# Patient Record
Sex: Female | Born: 1935
Health system: Southern US, Community
[De-identification: ages and names within clinical notes are randomized; demographics above are authoritative.]

## PROBLEM LIST (undated history)

## (undated) DIAGNOSIS — I10 Essential (primary) hypertension: Secondary | ICD-10-CM

## (undated) DIAGNOSIS — E119 Type 2 diabetes mellitus without complications: Secondary | ICD-10-CM

## (undated) DIAGNOSIS — E079 Disorder of thyroid, unspecified: Secondary | ICD-10-CM

## (undated) DIAGNOSIS — M51369 Other intervertebral disc degeneration, lumbar region without mention of lumbar back pain or lower extremity pain: Secondary | ICD-10-CM

## (undated) DIAGNOSIS — N289 Disorder of kidney and ureter, unspecified: Secondary | ICD-10-CM

## (undated) DIAGNOSIS — M5136 Other intervertebral disc degeneration, lumbar region: Secondary | ICD-10-CM

## (undated) HISTORY — PX: CHOLECYSTECTOMY: SHX55

## (undated) HISTORY — PX: BLADDER SURGERY: SHX569

---

## 2013-09-01 DIAGNOSIS — M5137 Other intervertebral disc degeneration, lumbosacral region: Secondary | ICD-10-CM | POA: Diagnosis present

## 2013-09-02 DIAGNOSIS — E1129 Type 2 diabetes mellitus with other diabetic kidney complication: Secondary | ICD-10-CM | POA: Diagnosis present

## 2013-09-02 DIAGNOSIS — E039 Hypothyroidism, unspecified: Secondary | ICD-10-CM | POA: Diagnosis present

## 2014-03-12 DIAGNOSIS — N183 Chronic kidney disease, stage 3 unspecified: Secondary | ICD-10-CM | POA: Diagnosis present

## 2016-03-07 DIAGNOSIS — M4316 Spondylolisthesis, lumbar region: Secondary | ICD-10-CM | POA: Diagnosis present

## 2016-03-07 DIAGNOSIS — M5441 Lumbago with sciatica, right side: Secondary | ICD-10-CM

## 2016-03-07 DIAGNOSIS — M5442 Lumbago with sciatica, left side: Secondary | ICD-10-CM

## 2016-03-07 DIAGNOSIS — G8929 Other chronic pain: Secondary | ICD-10-CM | POA: Diagnosis present

## 2017-06-12 ENCOUNTER — Inpatient Hospital Stay (HOSPITAL_COMMUNITY)
Admission: EM | Admit: 2017-06-12 | Discharge: 2017-06-21 | DRG: 552 | Disposition: A | Discharge status: Skilled Nursing Facility | Payer: Medicare Other | Attending: Oncology | Admitting: Oncology

## 2017-06-12 ENCOUNTER — Encounter (HOSPITAL_COMMUNITY): Payer: Self-pay

## 2017-06-12 ENCOUNTER — Other Ambulatory Visit: Payer: Self-pay

## 2017-06-12 ENCOUNTER — Emergency Department (HOSPITAL_COMMUNITY): Payer: Medicare Other

## 2017-06-12 DIAGNOSIS — M5442 Lumbago with sciatica, left side: Secondary | ICD-10-CM | POA: Diagnosis present

## 2017-06-12 DIAGNOSIS — M4316 Spondylolisthesis, lumbar region: Secondary | ICD-10-CM | POA: Diagnosis present

## 2017-06-12 DIAGNOSIS — G8929 Other chronic pain: Secondary | ICD-10-CM | POA: Diagnosis not present

## 2017-06-12 DIAGNOSIS — M6281 Muscle weakness (generalized): Secondary | ICD-10-CM | POA: Diagnosis not present

## 2017-06-12 DIAGNOSIS — M5137 Other intervertebral disc degeneration, lumbosacral region: Principal | ICD-10-CM | POA: Diagnosis present

## 2017-06-12 DIAGNOSIS — E1129 Type 2 diabetes mellitus with other diabetic kidney complication: Secondary | ICD-10-CM | POA: Diagnosis present

## 2017-06-12 DIAGNOSIS — M48061 Spinal stenosis, lumbar region without neurogenic claudication: Secondary | ICD-10-CM | POA: Diagnosis not present

## 2017-06-12 DIAGNOSIS — R5381 Other malaise: Secondary | ICD-10-CM | POA: Diagnosis not present

## 2017-06-12 DIAGNOSIS — M5136 Other intervertebral disc degeneration, lumbar region: Secondary | ICD-10-CM | POA: Diagnosis present

## 2017-06-12 DIAGNOSIS — G8911 Acute pain due to trauma: Secondary | ICD-10-CM | POA: Diagnosis not present

## 2017-06-12 DIAGNOSIS — M549 Dorsalgia, unspecified: Secondary | ICD-10-CM | POA: Diagnosis not present

## 2017-06-12 DIAGNOSIS — K5900 Constipation, unspecified: Secondary | ICD-10-CM | POA: Diagnosis present

## 2017-06-12 DIAGNOSIS — Z880 Allergy status to penicillin: Secondary | ICD-10-CM | POA: Diagnosis not present

## 2017-06-12 DIAGNOSIS — E876 Hypokalemia: Secondary | ICD-10-CM | POA: Diagnosis present

## 2017-06-12 DIAGNOSIS — R279 Unspecified lack of coordination: Secondary | ICD-10-CM | POA: Diagnosis not present

## 2017-06-12 DIAGNOSIS — S32000A Wedge compression fracture of unspecified lumbar vertebra, initial encounter for closed fracture: Secondary | ICD-10-CM

## 2017-06-12 DIAGNOSIS — M4807 Spinal stenosis, lumbosacral region: Secondary | ICD-10-CM | POA: Diagnosis not present

## 2017-06-12 DIAGNOSIS — R110 Nausea: Secondary | ICD-10-CM | POA: Diagnosis not present

## 2017-06-12 DIAGNOSIS — M5117 Intervertebral disc disorders with radiculopathy, lumbosacral region: Secondary | ICD-10-CM | POA: Diagnosis not present

## 2017-06-12 DIAGNOSIS — Z833 Family history of diabetes mellitus: Secondary | ICD-10-CM | POA: Diagnosis not present

## 2017-06-12 DIAGNOSIS — N183 Chronic kidney disease, stage 3 unspecified: Secondary | ICD-10-CM | POA: Diagnosis present

## 2017-06-12 DIAGNOSIS — Z9181 History of falling: Secondary | ICD-10-CM | POA: Diagnosis not present

## 2017-06-12 DIAGNOSIS — E039 Hypothyroidism, unspecified: Secondary | ICD-10-CM | POA: Diagnosis not present

## 2017-06-12 DIAGNOSIS — I129 Hypertensive chronic kidney disease with stage 1 through stage 4 chronic kidney disease, or unspecified chronic kidney disease: Secondary | ICD-10-CM | POA: Diagnosis not present

## 2017-06-12 DIAGNOSIS — M5116 Intervertebral disc disorders with radiculopathy, lumbar region: Secondary | ICD-10-CM | POA: Diagnosis not present

## 2017-06-12 DIAGNOSIS — Z111 Encounter for screening for respiratory tuberculosis: Secondary | ICD-10-CM | POA: Diagnosis not present

## 2017-06-12 DIAGNOSIS — E785 Hyperlipidemia, unspecified: Secondary | ICD-10-CM | POA: Diagnosis not present

## 2017-06-12 DIAGNOSIS — M5441 Lumbago with sciatica, right side: Secondary | ICD-10-CM

## 2017-06-12 DIAGNOSIS — Z66 Do not resuscitate: Secondary | ICD-10-CM | POA: Diagnosis present

## 2017-06-12 DIAGNOSIS — M48062 Spinal stenosis, lumbar region with neurogenic claudication: Secondary | ICD-10-CM

## 2017-06-12 DIAGNOSIS — Z8249 Family history of ischemic heart disease and other diseases of the circulatory system: Secondary | ICD-10-CM

## 2017-06-12 DIAGNOSIS — E1122 Type 2 diabetes mellitus with diabetic chronic kidney disease: Secondary | ICD-10-CM | POA: Diagnosis present

## 2017-06-12 DIAGNOSIS — I10 Essential (primary) hypertension: Secondary | ICD-10-CM | POA: Diagnosis not present

## 2017-06-12 DIAGNOSIS — R52 Pain, unspecified: Secondary | ICD-10-CM | POA: Diagnosis not present

## 2017-06-12 DIAGNOSIS — E119 Type 2 diabetes mellitus without complications: Secondary | ICD-10-CM

## 2017-06-12 DIAGNOSIS — S32000D Wedge compression fracture of unspecified lumbar vertebra, subsequent encounter for fracture with routine healing: Secondary | ICD-10-CM | POA: Diagnosis not present

## 2017-06-12 DIAGNOSIS — Z79899 Other long term (current) drug therapy: Secondary | ICD-10-CM

## 2017-06-12 DIAGNOSIS — R262 Difficulty in walking, not elsewhere classified: Secondary | ICD-10-CM | POA: Diagnosis not present

## 2017-06-12 DIAGNOSIS — M545 Low back pain: Secondary | ICD-10-CM

## 2017-06-12 HISTORY — DX: Other intervertebral disc degeneration, lumbar region without mention of lumbar back pain or lower extremity pain: M51.369

## 2017-06-12 HISTORY — DX: Type 2 diabetes mellitus without complications: E11.9

## 2017-06-12 HISTORY — DX: Other intervertebral disc degeneration, lumbar region: M51.36

## 2017-06-12 HISTORY — DX: Essential (primary) hypertension: I10

## 2017-06-12 LAB — BASIC METABOLIC PANEL
Anion gap: 10 (ref 5–15)
BUN: 15 mg/dL (ref 6–20)
CHLORIDE: 102 mmol/L (ref 101–111)
CO2: 27 mmol/L (ref 22–32)
CREATININE: 0.94 mg/dL (ref 0.44–1.00)
Calcium: 9.7 mg/dL (ref 8.9–10.3)
GFR, EST AFRICAN AMERICAN: 58 mL/min — AB (ref >60)
GFR, EST NON AFRICAN AMERICAN: 50 mL/min — AB (ref >60)
Glucose, Bld: 127 mg/dL — ABNORMAL HIGH (ref 65–99)
POTASSIUM: 3.4 mmol/L — AB (ref 3.5–5.1)
SODIUM: 139 mmol/L (ref 135–145)

## 2017-06-12 LAB — CBC WITH DIFFERENTIAL/PLATELET
BASOS PCT: 1 %
Basophils Absolute: 0 10*3/uL (ref 0.0–0.1)
EOS ABS: 0.2 10*3/uL (ref 0.0–0.7)
EOS PCT: 2 %
HCT: 39.2 % (ref 36.0–46.0)
HEMOGLOBIN: 12.5 g/dL (ref 12.0–15.0)
Lymphocytes Relative: 27 %
Lymphs Abs: 2.3 10*3/uL (ref 0.7–4.0)
MCH: 29.1 pg (ref 26.0–34.0)
MCHC: 31.9 g/dL (ref 30.0–36.0)
MCV: 91.2 fL (ref 78.0–100.0)
Monocytes Absolute: 0.9 10*3/uL (ref 0.1–1.0)
Monocytes Relative: 10 %
NEUTROS PCT: 60 %
Neutro Abs: 5.2 10*3/uL (ref 1.7–7.7)
PLATELETS: 355 10*3/uL (ref 150–400)
RBC: 4.3 MIL/uL (ref 3.87–5.11)
RDW: 13.4 % (ref 11.5–15.5)
WBC: 8.6 10*3/uL (ref 4.0–10.5)

## 2017-06-12 LAB — TSH: TSH: 3.254 u[IU]/mL (ref 0.350–4.500)

## 2017-06-12 MED ORDER — ATORVASTATIN CALCIUM 40 MG PO TABS
40.0000 mg | ORAL_TABLET | Freq: Every day | ORAL | Status: DC
  Administered 2017-06-12 – 2017-06-18 (×4): 40 mg via ORAL
  Filled 2017-06-12 (×5): qty 1

## 2017-06-12 MED ORDER — MORPHINE SULFATE (PF) 4 MG/ML IV SOLN
4.0000 mg | Freq: Once | INTRAVENOUS | Status: AC
  Administered 2017-06-12: 4 mg via INTRAVENOUS
  Filled 2017-06-12: qty 1

## 2017-06-12 MED ORDER — HYDROCODONE-ACETAMINOPHEN 5-325 MG PO TABS: 1.0000 | ORAL_TABLET | Freq: Four times a day (QID) | ORAL | Status: DC | PRN

## 2017-06-12 MED ORDER — ONDANSETRON HCL 4 MG/2ML IJ SOLN
4.0000 mg | Freq: Once | INTRAMUSCULAR | Status: AC
  Administered 2017-06-12: 4 mg via INTRAVENOUS
  Filled 2017-06-12: qty 2

## 2017-06-12 MED ORDER — HEPARIN SODIUM (PORCINE) 5000 UNIT/ML IJ SOLN
5000.0000 [IU] | Freq: Three times a day (TID) | INTRAMUSCULAR | Status: DC
  Administered 2017-06-12 – 2017-06-21 (×23): 5000 [IU] via SUBCUTANEOUS
  Filled 2017-06-12 (×24): qty 1

## 2017-06-12 MED ORDER — HYDROCODONE-ACETAMINOPHEN 5-325 MG PO TABS: 1.0000 | ORAL_TABLET | Freq: Four times a day (QID) | ORAL | 0 refills | Status: DC | PRN

## 2017-06-12 MED ORDER — FENTANYL CITRATE (PF) 100 MCG/2ML IJ SOLN
50.0000 ug | Freq: Once | INTRAMUSCULAR | Status: AC
  Administered 2017-06-12: 50 ug via INTRAVENOUS
  Filled 2017-06-12: qty 2

## 2017-06-12 MED ORDER — GABAPENTIN 300 MG PO CAPS
300.0000 mg | ORAL_CAPSULE | Freq: Every day | ORAL | Status: DC
  Administered 2017-06-12 – 2017-06-20 (×8): 300 mg via ORAL
  Filled 2017-06-12 (×9): qty 1

## 2017-06-12 MED ORDER — POTASSIUM CHLORIDE CRYS ER 20 MEQ PO TBCR
40.0000 meq | EXTENDED_RELEASE_TABLET | Freq: Once | ORAL | Status: AC
  Administered 2017-06-12: 40 meq via ORAL
  Filled 2017-06-12: qty 2

## 2017-06-12 MED ORDER — ONDANSETRON 4 MG PO TBDP: 4.0000 mg | ORAL_TABLET | Freq: Four times a day (QID) | ORAL | 0 refills | Status: DC | PRN

## 2017-06-12 MED ORDER — SODIUM CHLORIDE 0.9 % IV SOLN
INTRAVENOUS | Status: DC
  Administered 2017-06-12: 17:00:00 via INTRAVENOUS

## 2017-06-12 MED ORDER — GADOBENATE DIMEGLUMINE 529 MG/ML IV SOLN
10.0000 mL | Freq: Once | INTRAVENOUS | Status: AC | PRN
  Administered 2017-06-12: 10 mL via INTRAVENOUS

## 2017-06-12 MED ORDER — LORAZEPAM 2 MG/ML IJ SOLN
0.5000 mg | Freq: Once | INTRAMUSCULAR | Status: AC | PRN
  Administered 2017-06-12: 0.5 mg via INTRAVENOUS
  Filled 2017-06-12: qty 1

## 2017-06-12 MED ORDER — ACETAMINOPHEN 325 MG PO TABS
650.0000 mg | ORAL_TABLET | Freq: Four times a day (QID) | ORAL | Status: DC | PRN
  Administered 2017-06-12 – 2017-06-19 (×7): 650 mg via ORAL
  Filled 2017-06-12 (×7): qty 2

## 2017-06-12 MED ORDER — DOCUSATE SODIUM 100 MG PO CAPS: 100.0000 mg | ORAL_CAPSULE | Freq: Two times a day (BID) | ORAL | 0 refills | Status: DC

## 2017-06-12 MED ORDER — BOOST / RESOURCE BREEZE PO LIQD CUSTOM
1.0000 | Freq: Three times a day (TID) | ORAL | Status: DC
  Administered 2017-06-13 – 2017-06-20 (×19): 1 via ORAL

## 2017-06-12 MED ORDER — LEVOTHYROXINE SODIUM 100 MCG PO TABS
100.0000 ug | ORAL_TABLET | Freq: Every day | ORAL | Status: DC
  Administered 2017-06-13 – 2017-06-21 (×9): 100 ug via ORAL
  Filled 2017-06-12 (×10): qty 1

## 2017-06-12 NOTE — ED Notes (Signed)
Attempted Report x1.   

## 2017-06-12 NOTE — ED Notes (Signed)
Ortho made aware of the need for Quickdraw brace. MD Ward stated patient was allowed water and food along with ambulation after brace is placed.

## 2017-06-12 NOTE — Discharge Instructions (Signed)
Please follow-up closely with Dr. Catalina Antiguaamos  You did have slight progression of your spinal stenosis in your lumbar spine and 2 new appearing compression deformities.  We are putting you in a lumbar brace.  Please use your walker at all times.

## 2017-06-12 NOTE — ED Notes (Signed)
[employer] Employee at the bedside placing back brace.

## 2017-06-12 NOTE — ED Provider Notes (Signed)
Patient attempted to ambulate with walker but was unable to safely.  Discussed with teaching service.  Will see patient in the emergency department.  Family was updated.   Loren RacerYelverton, Clem Wisenbaker, MD 06/12/17 [phone]

## 2017-06-12 NOTE — ED Notes (Signed)
Ordered reg diet tray 

## 2017-06-12 NOTE — ED Notes (Signed)
ED Provider at bedside. 

## 2017-06-12 NOTE — ED Provider Notes (Addendum)
TIME SEEN: 1:49 AM  CHIEF COMPLAINT: Back pain  HPI: Patient is a 83 year old female with history of who presents to the emergency department with worsening back pain over the past several days and pain that shoots down both legs.  Tonight she states that her legs gave out from underneath her and she fell to the ground.  States that she slid down slowly and landed on her bottom.  Denies any new pain.  No head injury or neck pain.  Does have some tingling in both feet and ankles.  No bowel or bladder incontinence.  No urinary retention.  No fever.  She sees Dr. Dossie Der with pain management and has been receiving epidural injections.  Has had 3 total epidural injections with her last being December 5.  She is on gabapentin and has not had any relief her PCP is Dr. Tiana Loft.  Her family is concerned that she seems like her legs are more weak than normal but she states she thinks this may just be due to pain.  They report that she is now having to use a walker.  She was unable to stand at the house after going down to the ground.  ROS: See HPI Constitutional: no fever  Eyes: no drainage  ENT: no runny nose   Cardiovascular:  no chest pain  Resp: no SOB  GI: no vomiting GU: no dysuria Integumentary: no rash  Allergy: no hives  Musculoskeletal: no leg swelling  Neurological: no slurred speech ROS otherwise negative  PAST MEDICAL HISTORY/PAST SURGICAL HISTORY:  Past Medical History:  Diagnosis Date  . Degenerative disc disease, lumbar   . Diabetes mellitus without complication ([place])   . Hypertension     MEDICATIONS:  Prior to Admission medications   Not on File    ALLERGIES:  Allergies  Allergen Reactions  . Penicillins     SOCIAL HISTORY:  Social History   Tobacco Use  . Smoking status: Never Smoker  . Smokeless tobacco: Never Used  Substance Use Topics  . Alcohol use: No    Frequency: Never    FAMILY HISTORY: History reviewed. No pertinent family history.  EXAM: BP  (!) 178/67 (BP Location: Right Arm)   Pulse 61   Temp 97.6 F (36.4 C) (Oral)   Resp 17   Ht '5\' 4"'  (1.626 m)   Wt 59 kg (130 lb)   SpO2 100%   BMI 22.31 kg/m  CONSTITUTIONAL: Alert and oriented and responds appropriately to questions.  Elderly, grimacing in pain, afebrile HEAD: Normocephalic; atraumatic EYES: Conjunctivae clear, PERRL, EOMI ENT: normal nose; no rhinorrhea; moist mucous membranes; pharynx without lesions noted; no dental injury; no septal hematoma NECK: Supple, no meningismus, no LAD; no midline spinal tenderness, step-off or deformity; trachea midline CARD: RRR; S1 and S2 appreciated; no murmurs, no clicks, no rubs, no gallops RESP: Normal chest excursion without splinting or tachypnea; breath sounds clear and equal bilaterally; no wheezes, no rhonchi, no rales; no hypoxia or respiratory distress CHEST:  chest wall stable, no crepitus or ecchymosis or deformity, nontender to palpation; no flail chest ABD/GI: Normal bowel sounds; non-distended; soft, non-tender, no rebound, no guarding; no ecchymosis or other lesions noted PELVIS:  stable, nontender to palpation BACK:  The back appears normal but is tender to palpation over the lower lumbar spine without step-off or deformity.  There is no redness, warmth, ecchymosis or swelling noted. EXT: Normal ROM in all joints; non-tender to palpation; no edema; normal capillary refill; no cyanosis, no bony  tenderness or bony deformity of patient's extremities, no joint effusion, compartments are soft, extremities are warm and well-perfused, no ecchymosis SKIN: Normal color for age and race; warm, 2+ radial and DP pulses bilaterally NEURO: Moves all extremities equally but has difficulty with hip flexion bilaterally secondary to pain, normal dorsi and plantar flexion in both feet, normal strength in bilateral upper extremities, sensation to light touch intact except over her bilateral feet and ankles where she reports it is diminished, no  saddle anesthesia, normal reflexes in bilateral upper and lower extremities, no clonus, normal speech, cranial nerves II through XII intact PSYCH: The patient's mood and manner are appropriate. Grooming and personal hygiene are appropriate.  MEDICAL DECISION MAKING: Patient here with progressively worsening back pain that is now causing her to have her legs give out on her.  It is difficult to determine if this is from pain that radiates down both legs or weakness.  She has been receiving epidural injections and therefore would be at risk for infection.  Will obtain MRI of her lumbar spine with and without contrast to evaluate for epidural abscess, hematoma, discitis, cauda equina, spinal stenosis.  Will give pain medication and reassess.  ED PROGRESS: Patient's labs are unremarkable.  MRI shows critical spinal stenosis at L3-L4 and L4-L5 related to central disc extrusions with slight progression since 2017 when she had her last MRI.  There is also abnormal edema and enhancement of the inferior L4 and superior L5 endplates are likely due to acute versus subacute compression fractures.  Radiology reports cannot rule out osteomyelitis and discitis although I feel this is very unlikely.  No fever or leukocytosis.  Will discuss with neurosurgery for their recommendations.     7:50 AM  D/w Dr. Ronnald Ramp with neurosurgery. Appreciate his help. He has reviewed patient's imaging.  He does feel that she does have acute versus subacute endplate deformities.  He recommends a QuickDraw lumbar brace.  He states patient can follow-up with Dr. Nelva Bush as she would have access to a spinal surgeon with Dr. Tonita Cong or Dr. Rolena Infante.  He does not feel she needs to have surgery emergently.  He does recommend that if patient is unable to ambulate that she be admitted to the medicine service for physical therapy and pain control.  I discussed this with patient and HER-2 sons.  Patient would prefer discharge if possible.  She is comfortable  with this plan.  Will give second dose of IV pain medication here, apply brace and attempt to ambulate with a walker.  If she is able to ambulate safely she will be discharged home with prescriptions of Vicodin, Zofran, Colace and close follow-up with Dr. Herma Mering.   I reviewed all nursing notes, vitals, pertinent previous records, EKGs, lab and urine results, imaging (as available).      Ward, Delice Bison, DO 06/12/17 8887    Ward, Delice Bison, DO 06/12/17 [phone]

## 2017-06-12 NOTE — Progress Notes (Signed)
Orthopedic Tech Progress Note Patient Details:  Lori PateDoris B Ellis 11/17/1920 [id number]  Patient ID: Lori Pateoris B Ellis, female   DOB: 06/06/1921, 83 y.o.   MRN: [id number]   Saul FordyceJennifer C Nickoles Gregori 06/12/2017, 8:08 AMCalled [employer] for Lumbar brace.

## 2017-06-12 NOTE — ED Triage Notes (Addendum)
Per [facility], pt fell and found on floor. Per EMS, pt denies hitting head, denies loss of consciousness, and denies use of blood thinners. Pt reports falling onto bottom. EMS reports pt is treated by [facility] for degenerative disc disease. Pt has chronic back and bilateral leg pain. EMS reports pt has had increased leg weakness and increased falls. EMS reports pt complaining of right leg pain. Pt has hx of HTN and DM. EMS reports pt is alert and oriented x4. Initial BP of 200/98 and last BP of 180/80.

## 2017-06-12 NOTE — H&P (Signed)
Date: 06/12/2017               Patient Name:  Silvio PateDoris B Klepper MRN: [id number]  DOB: 05/17/1921 Age / Sex: 83 y.o., female   PCP: Patient, No Pcp Per         Medical Service: Internal Medicine Teaching Service         Attending Physician: Dr. Bonnetta BarryNo att. providers found    First Contact: Dr. Caron PresumeHelberg Pager: [phone]  Second Contact: Dr. Obie DredgeBlum Pager: [phone]       After Hours (After 5p/  First Contact Pager: [phone]  weekends / holidays): Second Contact Pager: [phone]   Chief Complaint: Progressive Weakness  History of Present Illness: Vickii PennaDoris Seidl is a 83 y.o female with degenerative disc disease, HTN, and DM who presented to the ED with progressive weakness. Patient just received Ativan and is sleeping. Son at bedside provided history. Son states that several months ago the patient began to experience left radicular pain that she saw Dr. Ethelene Halamos about. They discussed the various options to manage the patient's symptoms and attempted injections. The pain progressed and became more severe. She then began to experience the same radicular pain down the right leg. This pain lead to decreased sleep, decreased appetite and PO intake, and progressive weakness over the last couple of weeks.  She currently lives alone and is able to do most chores around the house without issue. She calls her son when she needs assistance or wants to leave the house. Her son does help with meals once a week. She does not seem to struggle with her memory and is able to recall birthdays and recent conversations. She did fall several weeks ago but did not seek further care. Her weakness has began to affect her ADLs and she now needs a walker to ambulate.  This AM she was attempting to go to the restroom when she slipped and fell to the ground. She did not hit her head or experience LOC. She crawled to a chair but was unable to get up. She then pressed her life alert button and her son came over. EMS was called and she was  brought to the ED for further evaluation.   Meds:  Current Meds  Medication Sig  . acetaminophen (TYLENOL) 500 MG tablet Take 500 mg by mouth every 6 (six) hours as needed for mild pain.  Marland Kitchen. atorvastatin (LIPITOR) 40 MG tablet Take 40 mg by mouth daily.  Marland Kitchen. gabapentin (NEURONTIN) 300 MG capsule Take 300 mg by mouth at bedtime.  . hydrochlorothiazide (HYDRODIURIL) 25 MG tablet Take 25 mg by mouth daily.  Marland Kitchen. levothyroxine (SYNTHROID, LEVOTHROID) 100 MCG tablet Take 100 mcg by mouth daily before breakfast.  . lisinopril (PRINIVIL,ZESTRIL) 40 MG tablet Take 40 mg by mouth daily.  . propranolol (INDERAL) 20 MG tablet Take 20-40 mg by mouth 2 (two) times daily. Two tablets in the morning and 1 tablet in the evening   Allergies: Allergies as of 06/12/2017 - Review Complete 06/12/2017  Allergen Reaction Noted  . Penicillins Rash 06/12/2017   Past Medical History:  Diagnosis Date  . Degenerative disc disease, lumbar   . Diabetes mellitus without complication ([facility])   . Hypertension    Family History:  3 sons alive.  + HTN, DM  Social History: Widowed, lives alone  Denies the use of tobacco, EtOH, and illicit substances  Review of Systems: A complete ROS was negative except as per HPI.   Physical Exam: Blood pressure Marland Kitchen(!)  118/57, pulse 63, temperature 97.6 F (36.4 C), temperature source Oral, resp. rate 14, height 5\' 4"  (1.626 m), weight 130 lb (59 kg), SpO2 99 %.  General: Thin elderly female resting comfortably  HENT: Normocephalic, atraumatic, moist mucus membranes  Pulm: Good air movement with no wheezing or crackles  CV: RRR, no murmurs, no rubs  Abdomen: Soft, non-distended, active bowel sounds, no apparent tenderness to palpation  Extremities: No LE edema, pulses palpable  Skin: Senile purpura noted, warm and dry  Neuro: Patient sleeping and unable to perform thorough neuro exam   MR Lumbar Spine IMPRESSION: Critical spinal stenosis at L3-4 and L4-5, multifactorial.  This relates to central disc extrusions, facet mediated anterolisthesis, as well as posterior element hypertrophy, with near complete block at both levels. Slight progression at both L3-4 and L4-5 since 2017.  Abnormal edema and enhancement of the inferior L4 and superior L5 endplates, likely representing acute to subacute compression deformities/acute Schmorl's nodes. Given the lack of enhancement of the disc at L4-5, diskitis with associated osteomyelitis appears unlikely, although not completely excluded.  Severe stenosis at L2-3 also multifactorial, related to 2 mm slip, and posterior element hypertrophy.  Assessment & Plan by Problem: Active Problems:   * No active hospital problems. *  Lumbar Spinal Stenosis. Vickii PennaDoris Pruss is a 83 y.o female with a history of degenerative disc disease who presented to the ED with bilateral radicular pain, LE weakness, and falls. MR of the Lumbar spine illustrated critical spinal stenosis at L2-3, L3-4, and L4-5. EDP discussed the case with Neurosurgery, Dr. Yetta BarreJones who reviewed the patient's imaging and [facility] and outpatient follow-up with Dr. Ethelene Halamos. Dr. Yetta BarreJones does not feel the patient needs emergent surgery. Unfortunately the patient is unable to ambulate due to deconditioning and weakness. She will be admitted for pain management, PT/OT evaluation, and possible placement.  - PT/OT consult  - Social work consult - Pain management with Gabapentin 300 mg QHS, and can add Norco 5 mg 1-2 tablets every 6 hours as need if pain is uncontrolled.   Deconditioning  - Dietician consult  - PT/OT/Social work consult   HTN - Holding Lisinopril 40 mg QD, HCTZ 25 mg QD, and propanolol 40 mg BID  Hypokalemia - Replete with oral k-dur  Diet: Regular VTE ppx: Lovenox  Code Status: DNR  Dispo: Admit patient to Inpatient with expected length of stay greater than 2 midnights.  Signed: Levora DredgeHelberg, Leif Loflin, MD 06/12/2017, 11:57 AM  My  Pager: [phone]

## 2017-06-12 NOTE — ED Notes (Signed)
Patient transported to MRI 

## 2017-06-12 NOTE — ED Notes (Signed)
Pt attempted to ambulate with walker. Pt was unable to stand up completely and take steps with the walker. Did not report it was due to pain.

## 2017-06-13 ENCOUNTER — Encounter (HOSPITAL_COMMUNITY): Payer: Self-pay

## 2017-06-13 LAB — BASIC METABOLIC PANEL
Anion gap: 11 (ref 5–15)
BUN: 21 mg/dL — ABNORMAL HIGH (ref 6–20)
CALCIUM: 9 mg/dL (ref 8.9–10.3)
CO2: 26 mmol/L (ref 22–32)
CREATININE: 1.05 mg/dL — AB (ref 0.44–1.00)
Chloride: 103 mmol/L (ref 101–111)
GFR, EST AFRICAN AMERICAN: 51 mL/min — AB (ref >60)
GFR, EST NON AFRICAN AMERICAN: 44 mL/min — AB (ref >60)
Glucose, Bld: 117 mg/dL — ABNORMAL HIGH (ref 65–99)
Potassium: 4.2 mmol/L (ref 3.5–5.1)
Sodium: 140 mmol/L (ref 135–145)

## 2017-06-13 LAB — URINALYSIS, ROUTINE W REFLEX MICROSCOPIC
Bacteria, UA: NONE SEEN
Bilirubin Urine: NEGATIVE
Glucose, UA: NEGATIVE mg/dL
Hgb urine dipstick: NEGATIVE
Ketones, ur: NEGATIVE mg/dL
Leukocytes, UA: NEGATIVE
Nitrite: NEGATIVE
Protein, ur: 30 mg/dL — AB
RBC / HPF: NONE SEEN RBC/hpf (ref 0–5)
Specific Gravity, Urine: 1.026 (ref 1.005–1.030)
pH: 5 (ref 5.0–8.0)

## 2017-06-13 MED ORDER — FENTANYL 12 MCG/HR TD PT72
12.5000 ug | MEDICATED_PATCH | TRANSDERMAL | Status: DC
  Administered 2017-06-13 – 2017-06-14 (×3): 12.5 ug via TRANSDERMAL
  Filled 2017-06-13 (×2): qty 1

## 2017-06-13 MED ORDER — FENTANYL CITRATE (PF) 100 MCG/2ML IJ SOLN
12.5000 ug | INTRAMUSCULAR | Status: DC | PRN
  Administered 2017-06-13 – 2017-06-14 (×5): 12.5 ug via INTRAVENOUS
  Filled 2017-06-13 (×5): qty 2

## 2017-06-13 MED ORDER — SENNOSIDES-DOCUSATE SODIUM 8.6-50 MG PO TABS
1.0000 | ORAL_TABLET | Freq: Two times a day (BID) | ORAL | Status: DC
  Administered 2017-06-13 – 2017-06-21 (×16): 1 via ORAL
  Filled 2017-06-13 (×17): qty 1

## 2017-06-13 MED ORDER — HYDROMORPHONE HCL 1 MG/ML IJ SOLN: 0.5000 mg | INTRAMUSCULAR | Status: DC | PRN

## 2017-06-13 NOTE — Discharge Summary (Signed)
Name: Lori PateDoris B Ellis MRN: [id number] DOB: 09/06/1920 83 y.o. PCP: Frederich BaldingPayne, Morgan Holland PA-C   Date of Admission: 06/12/2017  1:25 AM Date of Discharge:  Attending Physician: Levert FeinsteinGranfortuna, James M, MD  Discharge Diagnosis: 1. Spinal Stenosis  2. Deconditioning   Active Problems:   Spinal stenosis, lumbar   CKD (chronic kidney disease) stage 3, GFR 30-59 ml/min (HCC)   Chronic bilateral low back pain with bilateral sciatica   DDD (degenerative disc disease), lumbosacral   Hypothyroidism   Spondylolisthesis of lumbar region   Type II diabetes mellitus with renal manifestations (HCC)   Benign essential HTN  Discharge Medications: Allergies as of 06/21/2017      Reactions   Penicillins Rash      Medication List    STOP taking these medications   hydrochlorothiazide 25 MG tablet Commonly known as:  HYDRODIURIL     TAKE these medications   acetaminophen 325 MG tablet Commonly known as:  TYLENOL Take 2 tablets (650 mg total) by mouth every 6 (six) hours as needed for mild pain. What changed:    medication strength  how much to take   atorvastatin 40 MG tablet Commonly known as:  LIPITOR Take 40 mg by mouth daily.   docusate sodium 100 MG capsule Commonly known as:  COLACE Take 1 capsule (100 mg total) by mouth every 12 (twelve) hours.   feeding supplement (PRO-STAT SUGAR FREE 64) Liqd Take 30 mLs by mouth daily at 3 pm.   gabapentin 300 MG capsule Commonly known as:  NEURONTIN Take 300 mg by mouth at bedtime.   ibuprofen 400 MG tablet Commonly known as:  ADVIL,MOTRIN Take 1 tablet (400 mg total) by mouth 4 (four) times daily.   levothyroxine 100 MCG tablet Commonly known as:  SYNTHROID, LEVOTHROID Take 100 mcg by mouth daily before breakfast.   lisinopril 10 MG tablet Commonly known as:  PRINIVIL,ZESTRIL Take 1 tablet (10 mg total) by mouth daily. What changed:    medication strength  how much to take   ondansetron 4 MG disintegrating  tablet Commonly known as:  ZOFRAN ODT Take 1 tablet (4 mg total) by mouth every 6 (six) hours as needed for nausea or vomiting.   polyethylene glycol packet Commonly known as:  MIRALAX / GLYCOLAX Take 17 g by mouth daily. Start taking on:  06/22/2017   propranolol 20 MG tablet Commonly known as:  INDERAL Take 20-40 mg by mouth 2 (two) times daily. Two tablets in the morning and 1 tablet in the evening   senna-docusate 8.6-50 MG tablet Commonly known as:  Senokot-S Take 1 tablet by mouth 2 (two) times daily.   traMADol 50 MG tablet Commonly known as:  ULTRAM Take 1 tablet (50 mg total) by mouth every 6 (six) hours as needed for moderate pain or severe pain.            Durable Medical Equipment  (From admission, onward)        Start     Ordered   06/21/17 0000  For home use only DME Walker rolling    Comments:  Rolling walker with 5 inch wheels  Question:  Patient needs a walker to treat with the following condition  Answer:  Spinal stenosis   06/21/17 1220      Disposition and follow-up:   Ms.Krimson B Milana Kid[facility]Hoover [facility] [place]  At the hospital follow up visit please address:  1.  Please assess pain as patient  might need adjustments in pain medication regimen. Please assess blood pressure as she might need adjustment on antihypertensive medications as well.   2.  Labs / imaging needed at time of follow-up: BMP to assess renal function as she is on scheduled ibuprofen and lisinopril   3.  Pending labs/ test needing follow-up: None   Follow-up Appointments: Contact information for after-discharge care    Destination    [facility] Follow up.   Service:  Skilled Nursing Contact information: [address] [place] [address] [phone]              Hospital Course by problem list: Active Problems:   Spinal stenosis, lumbar   CKD (chronic kidney disease) stage 3, GFR 30-59 ml/min  (HCC)   Chronic bilateral low back pain with bilateral sciatica   DDD (degenerative disc disease), lumbosacral   Hypothyroidism   Spondylolisthesis of lumbar region   Type II diabetes mellitus with renal manifestations (HCC)   Benign essential HTN   1. Spinal Stenosis. Lori Ellis is a 83 y.o female with a history of degenerative disc disease who presented to the ED with bilateral radicular pain, LE weakness, and falls. MR of the Lumbar spine illustrated critical spinal stenosis at L2-3, L3-4, and L4-5. Case was discussed with Neurosurgery, Dr. Yetta Barre who reviewed the patient's imaging and recommend [employer] lumbar brace and outpatient follow-up with Dr. Ethelene Hal. Dr. Yetta Barre did not feel the patient needed emergent surgery. Patient was admitted for pain management. Initially on Fentanyl patch and Norco with adequate pain control, but became somnolent and agitated. Her pain was adequately controled with home gabapentin, scheduled Ibuprofen, and Tramadol PRN. Her mental status remained at baseline on this regimen. Physical and occupational therapy evaluated the patient and recommended SNF and rolling walker on discharge.   2. Deconditioning. [name]'s symptoms have progressed to the point that she was not obtaining much sleep and her appetite had decreased. She subsequently became very weak. Meal time supplements were initiated and physical therapy evaluated the patient. After adequate pain control achieved her appetite improved  3. HTN: Patient on propanolol at home which was continued during this admission. Review of chart revealed that she was previously on HCTZ which was discontinued on 05/25/2017 due to hyponatremia. Lisinopril is on patient's medication list, but unclear if she is taking it. Will start lisinopril 10 mg QD at discharge as she remained hypertensive on the propanolol alone and recommend BMP to monitor renal function as she is on schedule Ibuprofen as well. Might need increase in dose.    Discharge Vitals:   BP (!) 155/60 (BP Location: Left Arm)   Pulse 60   Temp 98.3 F (36.8 C) (Oral)   Resp 18   Ht 5\' 4"  (1.626 m)   Wt 130 lb (59 kg)   SpO2 100%   BMI 22.31 kg/m   Pertinent Labs, Studies, and Procedures:   MR Lumbar Spine Critical spinal stenosis at L3-4 and L4-5, multifactorial. This relates to central disc extrusions, facet mediated anterolisthesis, as well as posterior element hypertrophy, with near complete block at both levels. Slight progression at both L3-4 and L4-5 since 2017.  Abnormal edema and enhancement of the inferior L4 and superior L5 endplates, likely representing acute to subacute compression deformities/acute Schmorl's nodes. Given the lack of enhancement of the disc at L4-5, diskitis with associated osteomyelitis appears unlikely, although not completely excluded.  Severe stenosis at L2-3 also multifactorial, related to 2 mm slip, and posterior element hypertrophy.  Discharge Instructions: Discharge Instructions    Call MD for:  severe uncontrolled pain   Complete by:  As directed    Diet - low sodium heart healthy   Complete by:  As directed    Discharge instructions   Complete by:  As directed    It was a pleasure taking care of you. Please let your doctor know if your pain is not well controlled. He or she can make adjustments in your pain medication regimen at the rehab facility. Please start taking lisinopril 10 mg instead of 40 mg as your blood pressure was mildly elevated at times during this admission. You will continue taking labetalol as usual. Please call your nurse if you have any questions.   For home use only DME Walker rolling   Complete by:  As directed    Rolling walker with 5 inch wheels   Patient needs a walker to treat with the following condition:  Spinal stenosis   Increase activity slowly   Complete by:  As directed       Signed: Burna CashSantos-Sanchez, Abdulah Iqbal, MD 06/21/2017, 12:43 PM   My Pager:  [phone]

## 2017-06-13 NOTE — Progress Notes (Signed)
   Subjective: Patient continues to have extreme radicular pain in her legs made worse by movement. She is unsure if the gabapentin she takes helps at all. She denies changes in urinary or bowel habits. She is constipated. She feels she is weak but also struggles to walk due to the pain. Son at bedside is concerned with the current treatment plan and whether she needs surgery. Discussed that neurosurgery has been by to evaluate the patient. We will focus on pain management and PT. Patient and son voice understanding of the plan. All questions and concerns addressed.   Objective: Vital signs in last 24 hours: Vitals:   06/12/17 2100 06/13/17 0238 06/13/17 0300 06/13/17 0400  BP:  113/60  113/60  Pulse: 71 81 80 82  Resp:    12  Temp:  98.5 F (36.9 C)  98.5 F (36.9 C)  TempSrc:  Axillary  Axillary  SpO2: 93% 95% 99% 97%  Weight:      Height:       General: Thin elderly female resting comfortably  Pulm: good air movement with no wheezing or crackles  CV: RRR, no murmurs, no rubs  Abdomen: Lumbar back brace in place, soft, non-distended, no tenderness to palpation  Extremities: No LE edema, pulses palpable   Assessment/Plan:  Lumbar Spinal Stenosis  - MR of the Lumbar spine illustrated critical spinal stenosis at L2-3, L3-4, and L4-5. - Case discussed with Neurosurgery who reviewed the patient's imaging and recommend QuickDraw lumbar brace and outpatient follow-up with Dr. Ethelene Halamos. No need for emergent surgery  - PT and social work consulted  - Pain management with Gabapentin, Fentanyl patch with IV breakthrough   Deconditioning  - Meal supplements started  - PT/Social work consult   HTN - Currently normotensive  - Holding Lisinopril 40 mg QD, HCTZ 25 mg QD, and propanolol 40 mg BID  Dispo: Anticipated discharge in approximately >1 day(s). Will need PT evaluation and likely placement for rehabilitation.  Lori Ellis, Lori Grace, MD 06/13/2017, 6:07 AM My Pager: [phone]

## 2017-06-13 NOTE — Evaluation (Signed)
Physical Therapy Evaluation Patient Details Name: Lori PateDoris B Ellis MRN: [id number] DOB: 08/05/1920 Today's Date: 06/13/2017   History of Present Illness  83 year old female with past medical history of chronic low back pain, degenerative disc disease, hypertension and diabetes she presented to the emergency department yesterday due to worsening radicular pain symptoms and difficulty ambulating  Clinical Impression  Orders received for PT evaluation. Patient demonstrates deficits in functional mobility as indicated below. Will benefit from continued skilled PT to address deficits and maximize function. Will see as indicated and progress as tolerated.  Will likely need SNF as patient is severely limited with all aspects of mobility and self care.    Follow Up Recommendations SNF;Supervision/Assistance - 24 hour    Equipment Recommendations  Rolling walker with 5" wheels    Recommendations for Other Services       Precautions / Restrictions Precautions Precautions: Back Precaution Comments: for comfort Required Braces or Orthoses: Spinal Brace Restrictions Weight Bearing Restrictions: No      Mobility  Bed Mobility Overal bed mobility: Needs Assistance Bed Mobility: Rolling;Sidelying to Sit Rolling: Min assist Sidelying to sit: Mod assist       General bed mobility comments: min assist to roll to sidelying, moderate assist to elevate to upright, significant pain noted, patient thrusting back to sidelying to offset spasm pain.   Transfers Overall transfer level: Needs assistance Equipment used: Rolling walker (2 wheeled) Transfers: Sit to/from Stand Sit to Stand: Mod assist         General transfer comment: moderate assist to power to upright, unable to withstand extensive time in upright position due to pain  Ambulation/Gait Ambulation/Gait assistance: Mod assist Ambulation Distance (Feet): 4 Feet Assistive device: Rolling walker (2 wheeled) Gait Pattern/deviations:  Step-to pattern;Antalgic;Trunk flexed   Gait velocity interpretation: <1.8 ft/sec, indicative of risk for recurrent falls General Gait Details: patient with decreased activity tolerance, only able to take 4 steps before buckling in pain LLE  Stairs            Wheelchair Mobility    Modified Rankin (Stroke Patients Only)       Balance Overall balance assessment: Needs assistance   Sitting balance-Leahy Scale: Poor Sitting balance - Comments: due to pain   Standing balance support: Bilateral upper extremity supported;During functional activity Standing balance-Leahy Scale: Poor Standing balance comment: poor ability to maintain upright, flexed posture noted, patient with reports of extreme pain                             Pertinent Vitals/Pain Pain Assessment: 0-10 Pain Location: 10 Pain Descriptors / Indicators: Aching;Grimacing;Guarding;Radiating;Sharp;Spasm Pain Intervention(s): Limited activity within patient's tolerance;Repositioned    Home Living Family/patient expects to be discharged to:: Skilled nursing facility Living Arrangements: Alone Available Help at Discharge: Family;Available PRN/intermittently Type of Home: House Home Access: Level entry     Home Layout: One level Home Equipment: Walker - 2 wheels;Cane - single point      Prior Function Level of Independence: Independent with assistive device(s)               Hand Dominance        Extremity/Trunk Assessment   Upper Extremity Assessment Upper Extremity Assessment: Generalized weakness    Lower Extremity Assessment Lower Extremity Assessment: LLE deficits/detail;Difficult to assess due to impaired cognition LLE Deficits / Details: significant pain radiating down left leg LLE: Unable to fully assess due to pain    Cervical / Trunk  Assessment Cervical / Trunk Assessment: Kyphotic  Communication      Cognition Arousal/Alertness: Awake/alert Behavior During Therapy:  Anxious Overall Cognitive Status: Impaired/Different from baseline Area of Impairment: Orientation;Memory;Safety/judgement;Awareness;Problem solving                 Orientation Level: Disoriented to;Time;Situation   Memory: Decreased recall of precautions;Decreased short-term memory   Safety/Judgement: Decreased awareness of safety;Decreased awareness of deficits Awareness: Emergent Problem Solving: Decreased initiation;Difficulty sequencing;Requires verbal cues;Requires tactile cues General Comments: patient limited and distracted by extreme pain      General Comments      Exercises     Assessment/Plan    PT Assessment Patient needs continued PT services  PT Problem List Decreased strength;Decreased range of motion;Decreased activity tolerance;Decreased balance;Decreased mobility;Decreased coordination;Decreased cognition;Decreased safety awareness;Decreased knowledge of precautions;Pain       PT Treatment Interventions DME instruction;Gait training;Functional mobility training;Therapeutic activities;Therapeutic exercise;Balance training;Neuromuscular re-education;Cognitive remediation;Patient/family education;Modalities    PT Goals (Current goals can be found in the Care Plan section)  Acute Rehab PT Goals Patient Stated Goal: to go home PT Goal Formulation: With patient/family Time For Goal Achievement: 06/27/17 Potential to Achieve Goals: Fair    Frequency Min 3X/week   Barriers to discharge        Co-evaluation               AM-PAC PT "6 Clicks" Daily Activity  Outcome Measure Difficulty turning over in bed (including adjusting bedclothes, sheets and blankets)?: Unable Difficulty moving from lying on back to sitting on the side of the bed? : Unable Difficulty sitting down on and standing up from a chair with arms (e.g., wheelchair, bedside commode, etc,.)?: Unable Help needed moving to and from a bed to chair (including a wheelchair)?: A Lot Help  needed walking in hospital room?: A Lot Help needed climbing 3-5 steps with a railing? : Total 6 Click Score: 8    End of Session Equipment Utilized During Treatment: Gait belt;Back brace Activity Tolerance: Patient limited by pain Patient left: in chair;with call bell/phone within reach;with chair alarm set;with family/visitor present Nurse Communication: Mobility status PT Visit Diagnosis: Difficulty in walking, not elsewhere classified (R26.2);Pain Pain - Right/Left: Left Pain - part of body: Leg    Time: 1221-1243 PT Time Calculation (min) (ACUTE ONLY): 22 min   Charges:   PT Evaluation $PT Eval Moderate Complexity: 1 Mod     PT G Codes:        Lori Ellis, PT DPT  Board Certified Neurologic Specialist [phone]   Lori Ellis 06/13/2017, 1:54 PM

## 2017-06-13 NOTE — Progress Notes (Signed)
Writer walked in patient's room during change of shift with outgoing nurse and introduced self to patient's son who was standing by the sink. Son started asking questions about when the Doctors will come to address his mother's pain as no one has seen her since yesterday afternoon. Writer told son that they will do their rounds this morning but does not have a specific time. Son got upset and wanted someone in the room as soon as possible as he is tired of listening to his mother complain of pain. Writer reassured son that she will find out who the neurosurgeon is and will notify them of his request. Writer went into patient's chart and read the Internal Medicine's MD notes which stated that  EDP discussed the case with Neurosurgery, Dr. Yetta BarreJones who reviewed the patient's imaging and [facility] and outpatient follow-up with Dr. Ethelene Halamos. Dr. Yetta BarreJones does not feel the patient needs emergent surgery. This was related to son by Clinical research associatewriter and he he plans on speaking with Dr. Mikey BussingHoffman who is the attending today. Will continue to monitor patient.

## 2017-06-14 MED ORDER — PROPRANOLOL HCL 20 MG PO TABS
20.0000 mg | ORAL_TABLET | Freq: Every day | ORAL | Status: DC
  Administered 2017-06-14 – 2017-06-20 (×7): 20 mg via ORAL
  Filled 2017-06-14 (×7): qty 1

## 2017-06-14 MED ORDER — FENTANYL CITRATE (PF) 100 MCG/2ML IJ SOLN: 12.5000 ug | INTRAMUSCULAR | Status: DC | PRN

## 2017-06-14 MED ORDER — PROPRANOLOL HCL 20 MG PO TABS
20.0000 mg | ORAL_TABLET | Freq: Two times a day (BID) | ORAL | Status: DC
Start: 2017-06-14 — End: 2017-06-14

## 2017-06-14 MED ORDER — FENTANYL 25 MCG/HR TD PT72
25.0000 ug | MEDICATED_PATCH | TRANSDERMAL | Status: DC
  Administered 2017-06-16: 25 ug via TRANSDERMAL
  Filled 2017-06-14: qty 1

## 2017-06-14 MED ORDER — HYDROCODONE-ACETAMINOPHEN 5-325 MG PO TABS
1.0000 | ORAL_TABLET | ORAL | Status: DC | PRN
  Administered 2017-06-14 – 2017-06-16 (×8): 1 via ORAL
  Filled 2017-06-14 (×9): qty 1

## 2017-06-14 MED ORDER — PROPRANOLOL HCL 40 MG PO TABS
40.0000 mg | ORAL_TABLET | Freq: Every day | ORAL | Status: DC
  Administered 2017-06-14 – 2017-06-21 (×7): 40 mg via ORAL
  Filled 2017-06-14 (×8): qty 1

## 2017-06-14 MED ORDER — POLYETHYLENE GLYCOL 3350 17 G PO PACK
17.0000 g | PACK | Freq: Every day | ORAL | Status: DC
  Administered 2017-06-14 – 2017-06-21 (×7): 17 g via ORAL
  Filled 2017-06-14 (×7): qty 1

## 2017-06-14 NOTE — NC FL2 (Signed)
[place] MEDICAID FL2 LEVEL OF CARE SCREENING TOOL     IDENTIFICATION  Patient Name: Lori Ellis Birthdate: 02/14/1921 Sex: female Admission Date (Current Location): 06/12/2017  [facility] [place]   Facility and Address:  [facility] [place], [address], [place], [place] [address]      Provider Number: [id number]  Attending Physician Name and Address:  Gust RungHoffman, Erik C, DO  Relative Name and Phone Number:       Current Level of Care: Hospital Recommended Level of Care: Skilled Nursing Facility Prior Approval Number:    Date Approved/Denied: 06/14/17 PASRR Number: [id number]  Discharge Plan: SNF    Current Diagnoses: Patient Active Problem List   Diagnosis Date Noted  . Spinal stenosis, lumbar 06/12/2017    Orientation RESPIRATION BLADDER Height & Weight     Self  Normal External catheter Weight: 130 lb (59 kg) Height:  5\' 4"  (162.6 cm)  BEHAVIORAL SYMPTOMS/MOOD NEUROLOGICAL BOWEL NUTRITION STATUS      Continent Diet  AMBULATORY STATUS COMMUNICATION OF NEEDS Skin   Extensive Assist Verbally Normal                       Personal Care Assistance Level of Assistance  Bathing, Feeding, Dressing Bathing Assistance: Maximum assistance Feeding assistance: Limited assistance Dressing Assistance: Maximum assistance     Functional Limitations Info  Sight, Hearing, Speech Sight Info: Adequate Hearing Info: Adequate Speech Info: Adequate    SPECIAL CARE FACTORS FREQUENCY  PT (By licensed PT), OT (By licensed OT)     PT Frequency: 5x week OT Frequency: 5x week            Contractures Contractures Info: Not present    Additional Factors Info  Code Status, Allergies Code Status Info: DNR Allergies Info: Penicillins           Current Medications (06/14/2017):  This is the current hospital active medication list Current Facility-Administered Medications  Medication Dose Route Frequency Provider Last  Rate Last Dose  . 0.9 %  sodium chloride infusion   Intravenous Continuous Levora DredgeHelberg, Justin, MD   Stopped at 06/12/17 1833  . acetaminophen (TYLENOL) tablet 650 mg  650 mg Oral Q6H PRN Eulah PontBlum, Nina, MD   650 mg at 06/14/17 0831  . atorvastatin (LIPITOR) tablet 40 mg  40 mg Oral Daily Eulah PontBlum, Nina, MD   40 mg at 06/14/17 1729  . feeding supplement (BOOST / RESOURCE BREEZE) liquid 1 Container  1 Container Oral TID BM Eulah PontBlum, Nina, MD   1 Container at 06/14/17 1421  . [START ON 06/16/2017] fentaNYL (DURAGESIC - dosed mcg/hr) patch 25 mcg  25 mcg Transdermal Q72H Helberg, Justin, MD      . fentaNYL (SUBLIMAZE) injection 12.5 mcg  12.5 mcg Intravenous Q2H PRN Eulah PontBlum, Nina, MD      . gabapentin (NEURONTIN) capsule 300 mg  300 mg Oral QHS Eulah PontBlum, Nina, MD   300 mg at 06/13/17 2157  . heparin injection 5,000 Units  5,000 Units Subcutaneous Q8H Eulah PontBlum, Nina, MD   5,000 Units at 06/14/17 1604  . HYDROcodone-acetaminophen (NORCO/VICODIN) 5-325 MG per tablet 1 tablet  1 tablet Oral Q4H PRN Eulah PontBlum, Nina, MD   1 tablet at 06/14/17 1604  . levothyroxine (SYNTHROID, LEVOTHROID) tablet 100 mcg  100 mcg Oral QAC breakfast Eulah PontBlum, Nina, MD   100 mcg at 06/14/17 0831  . polyethylene glycol (MIRALAX / GLYCOLAX) packet 17 g  17 g Oral Daily Eulah PontBlum, Nina, MD  17 g at 06/14/17 1150  . propranolol (INDERAL) tablet 40 mg  40 mg Oral Daily Carlynn PurlHoffman, Erik C, DO   40 mg at 06/14/17 [id number]   And  . propranolol (INDERAL) tablet 20 mg  20 mg Oral QHS Gust RungHoffman, Erik C, DO      . senna-docusate (Senokot-S) tablet 1 tablet  1 tablet Oral BID Eulah PontBlum, Nina, MD   1 tablet at 06/14/17 0906     Discharge Medications: Please see discharge summary for a list of discharge medications.  Relevant Imaging Results:  Relevant Lab Results:   Additional Information SS# [id number]  Shayna Eblen H [place], [place]

## 2017-06-14 NOTE — Progress Notes (Signed)
   Subjective: Patient's son voices concerns about low grade temp and patient's fluctuating mental status. States that as long as the pain medicine is given on time he feels her pain is moderately well controlled. She was able to work with PT yesterday and he reports that it went well. Patient continues to be in pain this AM and states that we cannot do anything to help her. She is moving much more today compared to yesterday which is a good sign given that she states the pain is made worse with moving. Discussed the plan to continue to work on pain management. They voice understanding and agree with the plan. All questions and concerns addressed.   Objective: Vital signs in last 24 hours: Vitals:   06/13/17 1255 06/13/17 1653 06/13/17 2004 06/14/17 0429  BP: (!) 172/76 (!) 148/60 (!) 153/76 (!) 141/67  Pulse: 85 80 83 85  Resp: 18 14    Temp: (!) 97.4 F (36.3 C) 99.9 F (37.7 C) 99 F (37.2 C) 99.1 F (37.3 C)  TempSrc: Oral Oral Oral Oral  SpO2: 95% 97% 96% 95%  Weight:      Height:       General: Thin elderly female frequently moving and voicing she is in pain  Pulm: Good air movement with no wheezing or crackles CV: RRR, no murmurs, no rubs  Abdomen: Lumbar back brace in place, soft, non-distended, no tenderness to palpation  Extremities: No LE edema, pulses intact   Assessment/Plan:  Lumbar Spinal Stenosis.  Lori Ellis is a 83 y.o female with a history of degenerative disc disease who presented to the ED with bilateral radicular pain, LE weakness, and falls. MR of the Lumbar spine illustrated critical spinal stenosis at L2-3, L3-4, and L4-5. Case was discussed with Neurosurgery, Dr. Yetta BarreJones who reviewed the patient's imaging and [facility] and outpatient follow-up with Dr. Ethelene Halamos. Dr. Yetta BarreJones did not feel the patient needed emergent surgery. Patient was admitted for pain management. Physical therapy evaluated the patient and recommended SNF on discharge.  -  Current pain management regimen: Gabapentin 300 mg QHS, Fentanyl patch with IV Fentanyl for breakthrough pain   Deconditioning  - Meal supplements started  - PT recommending SNF placement  - Social work consulted  HTN - Restarting Propanolol 20 mg in the AM and 40 mg QHS - Continue holdingLisinopril 40 mg QD, and HCTZ 25 mg QD  Dispo: Anticipated discharge in approximately >1 day(s). PT recommending SNF. Social work consulted. Working on pain control.  Levora DredgeHelberg, Joshau Code, MD 06/14/2017, 6:44 AM My Pager: [phone]

## 2017-06-14 NOTE — Clinical Social Work Note (Signed)
Clinical Social Work Assessment  Patient Details  Name: Lori Ellis MRN: [id number] Date of Birth: 1920/12/08  Date of referral:  06/14/17               Reason for consult:  Facility Placement                Permission sought to share information with:  Facility Sport and exercise psychologist, Family Supports Permission granted to share information::  Yes, Verbal Permission Granted  Name::      [name] & [name]  Agency::     Relationship::   sons  Contact Information:   [phone] & [phone]  Housing/Transportation Living arrangements for the past 2 months:  Single Family Home Source of Information:  Patient, Adult Children Patient Interpreter Needed:  None Criminal Activity/Legal Involvement Pertinent to Current Situation/Hospitalization:  No - Comment as needed Significant Relationships:  Adult Children, Other Family Members, Friend Lives with:  Self Do you feel safe going back to the place where you live?  Yes Need for family participation in patient care:  Yes (Comment)  Care giving concerns: Pt lives alone and needs support with mobility and ADLs, SNF recommended for safety and to improve strength.    Social Worker assessment / plan: CSW met with pt and one of pt's sons at bedside. Pt is only oriented to self but did respond with some understanding to what SNF was. Pt son states that they are amenable to SNF recommendation since pt lives alone and they are aware she needs help with mobility. However pt son is concerned that PT/OT have not been by to make what pt son states "any details clear to me." Pt son also worried that pt supplemental insurance will change to [employer] in the new year. CSW spoke with son about SNF process and that they will be able to use Medicare and will let facilities know that her supplemental will change in new year. Pt son okay with seeing options states that he knows some SNFs and will see choices and talk with pt's other sons. Pt  son expressed that he feels pt pain management not under control and still feels that pt is not ready for discharge. CSW encouraged pt son to express these concerns to medical team when they come by to assess pt tomorrow. CSW will start SNF process and support discharge to SNF when medically appropriate.   Employment status:  Retired Forensic scientist:  Medicare PT Recommendations:  [place] / Referral to community resources:  [place]  Patient/Family's Response to care: Pt son amenable to SNF placement, but states that he does not feel pt pain is being managed for discharge.   Patient/Family's Understanding of and Emotional Response to Diagnosis, Current Treatment, and Prognosis: Pt son pleasant to CSW but states confusion and frustration surrounding pt diagnosis, current treatment and prognosis. Pt son states that he doesn't see care teams coming by to assess and work with pt and feels that they are not making pt family fully aware of care. CSW acknowledged and supported these concerns, encouraged pt son to express concerns to care team, and that CSW would continue to inform pt sons regarding SNF placement choices.   Emotional Assessment Appearance:  Appears stated age Attitude/Demeanor/Rapport:  Sedated(Quiet) Affect (typically observed):  Quiet, Calm Orientation:  Oriented to Self Alcohol / Substance use:  Never Used Psych involvement (Current and /or in the community):  No (Comment)  Discharge Needs  Concerns to be  addressed:  Discharge Planning Concerns, Care Coordination Readmission within the last 30 days:  No Current discharge risk:  Lives alone, Physical Impairment, Dependent with Mobility Barriers to Discharge:  Continued Medical Work up, [employer], [place] 06/14/2017, 5:44 PM

## 2017-06-14 NOTE — Progress Notes (Signed)
Today, Fentanyl duragesic order changed.  Pt already had a 12.5mcg patch on that was placed this am.  Next dose due on 12/29.  [facility] the 12.455mcg dose was supposed to be dc'd, but this is not until the 29th when new patch is due.  12.375mcg patch remains on.  Notified John in pharmacy to address the order and fix it to where pt will keep current patch until 12/29.

## 2017-06-14 NOTE — Progress Notes (Signed)
Pt absolutely does not want iv placed at this time.  Refuses to be stuck.

## 2017-06-14 NOTE — Progress Notes (Signed)
NOTIFIED DR. Alinda MoneyMELVIN OF PT'S IV COMING OUT AND PT NOT WANTING IT BACK.  STATES, "AT LEAST ATTEMPT AND IF SHE PUTS UP A BIG FIGHT, WE CAN HOLD OFF ON IT"

## 2017-06-15 DIAGNOSIS — R400 Somnolence: Secondary | ICD-10-CM

## 2017-06-15 LAB — BASIC METABOLIC PANEL
ANION GAP: 10 (ref 5–15)
BUN: 18 mg/dL (ref 6–20)
CHLORIDE: 102 mmol/L (ref 101–111)
CO2: 28 mmol/L (ref 22–32)
Calcium: 9.2 mg/dL (ref 8.9–10.3)
Creatinine, Ser: 1.14 mg/dL — ABNORMAL HIGH (ref 0.44–1.00)
GFR calc non Af Amer: 40 mL/min — ABNORMAL LOW (ref >60)
GFR, EST AFRICAN AMERICAN: 46 mL/min — AB (ref >60)
Glucose, Bld: 106 mg/dL — ABNORMAL HIGH (ref 65–99)
Potassium: 3.6 mmol/L (ref 3.5–5.1)
Sodium: 140 mmol/L (ref 135–145)

## 2017-06-15 MED ORDER — NALOXONE HCL 0.4 MG/ML IJ SOLN: 0.4000 mg | INTRAMUSCULAR | Status: DC | PRN

## 2017-06-15 NOTE — Progress Notes (Signed)
CSW provided pt's nurse with list of SNF placements in the area. SNFs that have accepted the pt were highlighted. Will be provided to pt's family members. CSW wrote note on packet asking family members to have preferences decided on by tomorrow morning.   Montine CircleKelsy Georg Ang, Silverio LayLCSWA [facility]  [phone]

## 2017-06-15 NOTE — Progress Notes (Signed)
Physical Therapy Treatment Patient Details Name: Lori Ellis MRN: [id number] DOB: 09/29/1920 Today's Date: 06/15/2017    History of Present Illness 83 year old female with past medical history of chronic low back pain, degenerative disc disease, hypertension and diabetes she presented to the emergency department yesterday due to worsening radicular pain symptoms and difficulty ambulating    PT Comments    Pt very resistant to working with PT and became agitated and starting swatting at PT during transfer to EOB. Pt keeps screaming "my leg" "my leg" "my leg". Pt mobility greatly limited by pain and impaired cognition/comprehension of situation and benefits for mobility. Acute PT to con't to try to work with patient during hospital stay.    Follow Up Recommendations  SNF;Supervision/Assistance - 24 hour     Equipment Recommendations  Rolling walker with 5" wheels    Recommendations for Other Services       Precautions / Restrictions Precautions Precautions: Back Precaution Comments: brace for comfort Required Braces or Orthoses: Spinal Brace Spinal Brace: Applied in sitting position Restrictions Weight Bearing Restrictions: No    Mobility  Bed Mobility Overal bed mobility: Needs Assistance Bed Mobility: Supine to Sit;Sit to Supine     Supine to sit: Max assist;+2 for physical assistance Sit to supine: Max assist;+2 for physical assistance   General bed mobility comments: pt with no initiation, max tactile cues to complete task  Transfers                 General transfer comment: pt resistant, and returned self to bed  Ambulation/Gait                 Stairs            Wheelchair Mobility    Modified Rankin (Stroke Patients Only)       Balance Overall balance assessment: Needs assistance   Sitting balance-Leahy Scale: Poor Sitting balance - Comments: due to pain                                    Cognition  Arousal/Alertness: Lethargic(per son, she is sleeping because shes doesn't want to deal) Behavior During Therapy: Agitated Overall Cognitive Status: Impaired/Different from baseline                                 General Comments: pt sleeping upon arrival, son gave the okay to work with the patient. pt assist to EOB and then began screaming "my leg" and swatting at PT stating leave me alone      Exercises      General Comments        Pertinent Vitals/Pain Pain Assessment: Faces Faces Pain Scale: Hurts worst Pain Location: pt screaming "my leg" during movement  Pain Descriptors / Indicators: Grimacing Pain Intervention(s): Patient requesting pain meds-RN notified    Home Living                      Prior Function            PT Goals (current goals can now be found in the care plan section) Acute Rehab PT Goals Patient Stated Goal: to go home Progress towards PT goals: Not progressing toward goals - comment(due to pain and resistance)    Frequency    Min 3X/week      PT Plan Current  plan remains appropriate    Co-evaluation              AM-PAC PT "6 Clicks" Daily Activity  Outcome Measure  Difficulty turning over in bed (including adjusting bedclothes, sheets and blankets)?: Unable Difficulty moving from lying on back to sitting on the side of the bed? : Unable Difficulty sitting down on and standing up from a chair with arms (e.g., wheelchair, bedside commode, etc,.)?: Unable Help needed moving to and from a bed to chair (including a wheelchair)?: Total Help needed walking in hospital room?: Total Help needed climbing 3-5 steps with a railing? : Total 6 Click Score: 6    End of Session   Activity Tolerance: Patient limited by pain Patient left: in bed;with call bell/phone within reach;with bed alarm set;with family/visitor present Nurse Communication: Mobility status PT Visit Diagnosis: Difficulty in walking, not elsewhere  classified (R26.2);Pain Pain - Right/Left: Left Pain - part of body: Leg     Time: [id number] PT Time Calculation (min) (ACUTE ONLY): 17 min  Charges:  $Therapeutic Activity: 8-22 mins                    G Codes:       Lewis ShockAshly Temitope Griffing, PT, DPT Pager #: [phone] Office #: [phone]    Lori Ellis 06/15/2017, 12:27 PM

## 2017-06-15 NOTE — Progress Notes (Addendum)
   Subjective:  No acute events overnight. Able to sit up in chair yesterday. Per RN and patient's son, patient appeared agitated this morning. She refused to take her morning medications and refused to work with PT as well. Patient's son concerned about her mental status as she now seems very sleepy and is not behaving like her usual self. She is very somnolent this morning, but appeared comfortable in bed. Unable to follow commands. Son states pain relief is lasting ~4 hours with Norco.   Objective: Vital signs in last 24 hours: Vitals:   06/14/17 2013 06/14/17 2336 06/15/17 0354 06/15/17 0816  BP: (!) 166/79 139/63 (!) 154/72 (!) 137/57  Pulse: 73 65 66 62  Resp: 16 16 14    Temp: 98.5 F (36.9 C) 98.3 F (36.8 C) (!) 97.5 F (36.4 C) 98.1 F (36.7 C)  TempSrc: Oral Oral Axillary Axillary  SpO2: 96% 98% 98% 95%  Weight:      Height:       General: thin, elderly female lying in bed, very somnolent but in no acute distress   Eyes: PERRL  Pulm: good air movement, no wheezes or crackles, no increased work of breathing  CV: RRR, nl S1/S2, no murmurs, rubs or gallops   Abdomen: soft, NTND, normoactive bowel sounds   Neuro: somnolent, unable to around with sternal rub and painful stimuli  Extremities: warm and well perfused, no LE edema bilaterally   Assessment/Plan:  Lori Ellis is a 83 y.o female with a history of degenerative disc disease who presented to the ED with bilateral radicular pain, LE weakness, and falls. MR of the Lumbar spine illustrated critical spinal stenosis at L2-3, L3-4, and L4-5.  # Lumbar Spinal Stenosis:  Case was discussed with Neurosurgery, Dr. Yetta BarreJones who reviewed the patient's imaging and [facility] and outpatient follow-up with Dr. Ethelene Halamos. Dr. Yetta BarreJones did not feel the patient needed emergent surgery. Now working on pain management.  - Current pain management regimen: Gabapentin 300 mg QHS, Fentanyl patch 12.5-->5525mcg 12/29  + Norco 5 q4h  PRN  - Will hold Norco as long as she remains somnolent. Will re-evaluate her later today.  - Narcan PRN  - Continue PT   # Deconditioning  - Meal supplements started  - PT recommending SNF placement  - Social work consulted    # HTN - Continue Propanolol 20 mg in the AM and 40 mg QHS - HoldingLisinopril 40 mg QD, and HCTZ 25 mg QD  Dispo: Anticipated discharge in approximately >1 day(s). PT recommending SNF. Social work consulted. Working on pain control.  Burna CashSantos-Sanchez, Sadye Kiernan, MD 06/15/2017, 10:53 AM My Pager: [phone]

## 2017-06-15 NOTE — Progress Notes (Signed)
Internal Medicine Attending:   I saw and examined the patient. I reviewed the resident's note and I agree with the resident's findings and plan as documented in the resident's note. On her initial evaluation of Ms. Hooper today she was sleeping comfortably in bed however she would not wake to voice or touch.  In conversation with her son who is at her bedside about 30 minutes earlier she was very agitated because she did not want to work with physical therapy and was refusing everything sometimes refusing to speak with him.  He noted to us that her pain medication Norco was offered but she refused to take it at that time and started resting.  I was concerned that she may have had a little too much Norco eat despite low-dose especially since her creatinine may misrepresent how bad her renal function could be given her low body weight and frail nature.  However later in the day when her other some came back she was much more alert and able to work with PT.  Overall the son reports to me that she seemed much more comfortable in bed and she is able to work some with physical therapy assistant to their help and recommendations.  Plan for tomorrow when her fentanyl patch needs to be replaced will be to go up to 25 mcg I think fentanyl may be a better choice to give more 24-hour pain control coverage and hopefully reduce the need for Norco.  We can progress with her to the point that she can be discharged to skilled nursing facility tomorrow.

## 2017-06-15 NOTE — Progress Notes (Signed)
Physical Therapy Treatment Patient Details Name: Lori PateDoris B Vaca MRN: [id number] DOB: 05/11/1921 Today's Date: 06/15/2017    History of Present Illness 83 year old female with past medical history of chronic low back pain, degenerative disc disease, hypertension and diabetes she presented to the emergency department yesterday due to worsening radicular pain symptoms and difficulty ambulating    PT Comments    PT returned per request of son due to pt receiving pain medication. Pt did cooperate with PT and attempted ambulation. Pt with poor activity tolerance due to L LE and back pain. Pt remains resistant to help, impulsive and demo's impaired comprehension. Pt also remains at increased falls risk. Acute PT to con't to follow.    Follow Up Recommendations  SNF;Supervision/Assistance - 24 hour     Equipment Recommendations  Rolling walker with 5" wheels    Recommendations for Other Services       Precautions / Restrictions Precautions Precautions: Back Precaution Comments: brace for comfort Required Braces or Orthoses: Spinal Brace Spinal Brace: Applied in sitting position Restrictions Weight Bearing Restrictions: No    Mobility  Bed Mobility Overal bed mobility: Needs Assistance Bed Mobility: Supine to Sit     Supine to sit: Min assist Sit to supine: Max assist;+2 for physical assistance   General bed mobility comments: pt initiated transfer and pulled self to EOB using hand rail, increased time, minA to scoot hips to edge to allow feet to reach floor  Transfers Overall transfer level: Needs assistance Equipment used: Rolling walker (2 wheeled) Transfers: Sit to/from Stand Sit to Stand: Min assist         General transfer comment: pt pulled up on walker despite v/cs to push up from bed, unable to achieve full knee extension and trunk extension due to pain  Ambulation/Gait Ambulation/Gait assistance: Mod assist Ambulation Distance (Feet): 8 Feet Assistive  device: Rolling walker (2 wheeled) Gait Pattern/deviations: Step-to pattern;Antalgic;Trunk flexed   Gait velocity interpretation: <1.8 ft/sec, indicative of risk for recurrent falls General Gait Details: pt with bilat knees and trunk flexed due to pain, limited ambulation tolerance   Stairs            Wheelchair Mobility    Modified Rankin (Stroke Patients Only)       Balance Overall balance assessment: Needs assistance   Sitting balance-Leahy Scale: Fair Sitting balance - Comments: due to pain   Standing balance support: Bilateral upper extremity supported;During functional activity Standing balance-Leahy Scale: Poor Standing balance comment: poor ability to maintain upright, flexed posture noted, patient with reports of extreme pain                            Cognition Arousal/Alertness: Awake/alert Behavior During Therapy: Flat affect Overall Cognitive Status: Impaired/Different from baseline                                 General Comments: pt pleasant this session and agreeable to trying to ambulate. Pt remains to have no comprehension of hospital and importance of mobility      Exercises      General Comments        Pertinent Vitals/Pain Pain Assessment: Faces Faces Pain Scale: Hurts little more Pain Location: L LE Pain Descriptors / Indicators: Grimacing Pain Intervention(s): Patient requesting pain meds-RN notified    Home Living  Prior Function            PT Goals (current goals can now be found in the care plan section) Acute Rehab PT Goals Patient Stated Goal: to go home Progress towards PT goals: Progressing toward goals    Frequency    Min 3X/week      PT Plan Current plan remains appropriate    Co-evaluation              AM-PAC PT "6 Clicks" Daily Activity  Outcome Measure  Difficulty turning over in bed (including adjusting bedclothes, sheets and blankets)?:  Unable Difficulty moving from lying on back to sitting on the side of the bed? : Unable Difficulty sitting down on and standing up from a chair with arms (e.g., wheelchair, bedside commode, etc,.)?: A Lot Help needed moving to and from a bed to chair (including a wheelchair)?: A Lot Help needed walking in hospital room?: A Lot Help needed climbing 3-5 steps with a railing? : Total 6 Click Score: 9    End of Session Equipment Utilized During Treatment: Gait belt;Back brace Activity Tolerance: Patient limited by pain Patient left: in chair;with call bell/phone within reach;with chair alarm set;with family/visitor present Nurse Communication: Mobility status PT Visit Diagnosis: Difficulty in walking, not elsewhere classified (R26.2);Pain Pain - Right/Left: Left Pain - part of body: Leg     Time: 1300-1315 PT Time Calculation (min) (ACUTE ONLY): 15 min  Charges:  $Gait Training: 8-22 mins $Therapeutic Activity: 8-22 mins                    G Codes:       Lewis ShockAshly Kassiah Mccrory, PT, DPT Pager #: [phone] Office #: [phone]    Austen Wygant M Takeo Harts 06/15/2017, 1:39 PM

## 2017-06-16 LAB — BASIC METABOLIC PANEL
Anion gap: 9 (ref 5–15)
BUN: 22 mg/dL — ABNORMAL HIGH (ref 6–20)
CALCIUM: 8.9 mg/dL (ref 8.9–10.3)
CO2: 26 mmol/L (ref 22–32)
CREATININE: 1.1 mg/dL — AB (ref 0.44–1.00)
Chloride: 102 mmol/L (ref 101–111)
GFR calc non Af Amer: 42 mL/min — ABNORMAL LOW (ref >60)
GFR, EST AFRICAN AMERICAN: 48 mL/min — AB (ref >60)
GLUCOSE: 206 mg/dL — AB (ref 65–99)
Potassium: 3.6 mmol/L (ref 3.5–5.1)
Sodium: 137 mmol/L (ref 135–145)

## 2017-06-16 MED ORDER — PRO-STAT SUGAR FREE PO LIQD
30.0000 mL | Freq: Every day | ORAL | Status: DC
  Administered 2017-06-16 – 2017-06-20 (×5): 30 mL via ORAL
  Filled 2017-06-16 (×5): qty 30

## 2017-06-16 MED ORDER — TRAMADOL HCL 50 MG PO TABS
50.0000 mg | ORAL_TABLET | Freq: Four times a day (QID) | ORAL | Status: DC | PRN
  Administered 2017-06-16 – 2017-06-21 (×15): 50 mg via ORAL
  Filled 2017-06-16 (×16): qty 1

## 2017-06-16 MED ORDER — SODIUM CHLORIDE 0.9 % IV SOLN
INTRAVENOUS | Status: AC
  Administered 2017-06-16 (×2): via INTRAVENOUS

## 2017-06-16 MED ORDER — HYDROCODONE-ACETAMINOPHEN 5-325 MG PO TABS: 1.0000 | ORAL_TABLET | Freq: Four times a day (QID) | ORAL | Status: DC | PRN

## 2017-06-16 NOTE — Progress Notes (Signed)
   Subjective:  No acute events overnight.  Patient did very somnolent again this morning when seen during rounds. Sons, Buddy DutyLarry and Ken, at bedside and expressed concern about pain management regimen. They are concern patient spends too much time sleeping and does not seem to be very active. They state her PO intake has markedly decreased because she is sleepy and tired most of the time. Social worker saw patient later in the day at which time she was awake and eating. She discussed SNF placement, but family not agreeable to this until further changes to pain regimen are made and patient is pain free. They would also like a second opinion from a neurosurgeon since pain medication is not working. Team spent 30 minutes in patient's room discussing treatment plan with family.   Objective:  Vital signs in last 24 hours: Vitals:   06/15/17 2019 06/15/17 2355 06/16/17 0411 06/16/17 0846  BP: 136/64 (!) 125/59 (!) 111/50 (!) 104/50  Pulse: 81 68 69   Resp:    16  Temp: 97.8 F (36.6 C) 98 F (36.7 C) 98 F (36.7 C) (!) 97.3 F (36.3 C)  TempSrc: Axillary Oral Axillary Axillary  SpO2: 95% 98% 95%   Weight:      Height:       General: pleasant female, thin, well-developed, lying in bed in no acute distress, somnolent and unable to arouse on sternal rub  CV: regular rate and rhythm, nl S1/S2, no murmurs, rubs or gallops  Pulm: CTAB, no wheezes or crackles, no increased work of breathing  Abd: soft, NTND, normoactive bowel sounds  Neuro: somnolent on exam, unable to arouse with sternal rub  Ext: warm and well perfused, no peripheral edema    Assessment/Plan:  Vickii PennaDoris Pizana is a 83 y.o female with a history of degenerative disc disease who presented to the ED with bilateral radicular pain, LE weakness, and falls. MR of the Lumbar spine illustrated critical spinal stenosis at L2-3, L3-4, and L4-5.  # Lumbar Spinal Stenosis:  Casewas discussedwith Neurosurgery, Dr. Yetta BarreJones who reviewed the  patient's imaging and [facility] and outpatient follow-up with Dr. Ethelene Halamos. Dr. Yetta BarreJones didnot feel the patient neededemergent surgery. Now working on pain management optimization. - Increase Fentanyl patch 12.5--> 25 mcg  - Stop Norco 5 mg q4h PRN and switch to Tramadol 50 mg q6h PRN  - Continue gabapentin 300 mg QHS  - Narcan PRN  - Continue PT   # Deconditioning  -Continue meal supplements  - Changed in pain medication regimen as above with goal that patient will be more alert and awake to eat  - PT recommending SNF placement   # HTN - Continue Propanolol 20 mg in the AM and 40 mg QHS - HoldingLisinopril 40 mg QD, and HCTZ 25 mg QD   Dispo: Anticipated discharge in approximately 1-3 days pending optimization of pain management regimen.   Burna CashSantos-Sanchez, Zuly Belkin, MD 06/16/2017, 11:22 AM Pager: [phone]

## 2017-06-16 NOTE — Progress Notes (Signed)
Initial Nutrition Assessment  DOCUMENTATION CODES:   Not applicable  INTERVENTION:  Continue Boost Breeze po TID, each supplement provides 250 kcal and 9 grams of protein.  Provide 30 ml Prostat po once daily, each supplement provides 100 kcal and 15 grams of protein.   Encourage adequate PO intake.  NUTRITION DIAGNOSIS:   Inadequate oral intake related to poor appetite as evidenced by meal completion < 50%.  GOAL:   Patient will meet greater than or equal to 90% of their needs  MONITOR:   PO intake, Supplement acceptance, Labs, Weight trends, I & O's, Skin  REASON FOR ASSESSMENT:   Consult Assessment of nutrition requirement/status  ASSESSMENT:   83 y.o female with a history of degenerative disc disease who presented to the ED with bilateral radicular pain, LE weakness, and falls. MR of the Lumbar spine illustrated critical spinal stenosis at L2-3, L3-4, and L4-5.  Meal completion has been 25%. Family at bedside has been encouraging pt to eat at meals and additionally has been brining in outside food to help maximize pt po intake. Pt reports usually consuming 3 meals a day however portions reported to be small. Family reports additionally providing snacks for her in between meals on a usual basis to ensure adequate nutrition. Usual body weight unknown. Pt currently has Boost Breeze ordered and has been consuming them and enjoying them. RD offered Ensure, however pt refused reporting she dislikes them. RD to additionally order Prostat to aid in protein needs.   Labs and medications reviewed.   NUTRITION - FOCUSED PHYSICAL EXAM:    Most Recent Value  Orbital Region  Unable to assess  Upper Arm Region  No depletion  Thoracic and Lumbar Region  No depletion  Buccal Region  Unable to assess  Temple Region  Unable to assess  Clavicle Bone Region  Moderate depletion  Clavicle and Acromion Bone Region  Moderate depletion  Scapular Bone Region  Unable to assess  Dorsal Hand   Unable to assess  Patellar Region  Mild depletion  Anterior Thigh Region  Mild depletion  Posterior Calf Region  Mild depletion  Edema (RD Assessment)  None  Hair  Reviewed  Eyes  Reviewed  Mouth  Reviewed  Skin  Reviewed  Nails  Reviewed       Diet Order:  Diet regular Room service appropriate? Yes; Fluid consistency: Thin  EDUCATION NEEDS:   Not appropriate for education at this time  Skin:  Skin Assessment: Reviewed RN Assessment  Last BM:  Unknown  Height:   Ht Readings from Last 1 Encounters:  06/12/17 5\' 4"  (1.626 m)    Weight:   Wt Readings from Last 1 Encounters:  06/12/17 130 lb (59 kg)    Ideal Body Weight:  54.5 kg  BMI:  Body mass index is 22.31 kg/m.  Estimated Nutritional Needs:   Kcal:  1450-1650  Protein:  55-65 grams  Fluid:  >/= 1.5 L/day    Roslyn SmilingStephanie Sharlot Sturkey, MS, RD, LDN Pager # [phone] After hours/ weekend pager # [phone]

## 2017-06-16 NOTE — Progress Notes (Signed)
CSW met with patient and her adult son [name] at bedside. Patient currently eating and receiving medication from RN Dorothy during interaction. CSW and [name] had extensive conversation regarding patient's current state and plan for discharge. [name] and Ken, both sons of the patient are concerned with the pain management plan that has been taking place while patient has been at [facility]. CSW spoke with [name] regarding accepting SNF list that was provided yesterday from week day CSW. [name] stated that the family is in agreement for SNF placement, and at this time prefers [facility] for placement due to them accepting the patient's insurance. Family will not make definite decision for SNF placement until pain management plan has been completed and presented to them and they are in agreement.  CSW to continue following until discharge is completed.   , MSW, LCSW-A Weekend Clinical Social Worker [phone]    

## 2017-06-16 NOTE — Progress Notes (Signed)
Medicine attending: Clinical status and database reviewed with resident physician Dr. Lovenia KimSantos-Sanchez and I concur with her evaluation and management plan which we discussed together.  83 year old woman with advanced spinal stenosis.  Pain control has been a challenge in view of the sedative effects of the narcotic analgesics.  She is not felt to be a candidate for urgent surgery.  We are titrating Transderm fentanyl.  Continue gabapentin.  Discontinue Norco.  Trial of tramadol on a as needed schedule. Patient's sons were present and status discussed at length with the medical residents.  I was not part of the discussion.  They are concerned that it is premature to discharge the patient to a nursing facility until we have a better handle on her pain.

## 2017-06-17 DIAGNOSIS — M5116 Intervertebral disc disorders with radiculopathy, lumbar region: Secondary | ICD-10-CM

## 2017-06-17 LAB — BASIC METABOLIC PANEL
ANION GAP: 8 (ref 5–15)
BUN: 24 mg/dL — ABNORMAL HIGH (ref 6–20)
CO2: 26 mmol/L (ref 22–32)
CREATININE: 1.07 mg/dL — AB (ref 0.44–1.00)
Calcium: 8.2 mg/dL — ABNORMAL LOW (ref 8.9–10.3)
Chloride: 102 mmol/L (ref 101–111)
GFR calc non Af Amer: 43 mL/min — ABNORMAL LOW (ref >60)
GFR, EST AFRICAN AMERICAN: 50 mL/min — AB (ref >60)
Glucose, Bld: 133 mg/dL — ABNORMAL HIGH (ref 65–99)
POTASSIUM: 3.5 mmol/L (ref 3.5–5.1)
Sodium: 136 mmol/L (ref 135–145)

## 2017-06-17 NOTE — Progress Notes (Signed)
Medicine attending: I examined this patient today together with resident physician Dr. Lovenia KimSantos-Sanchez and I concur with her evaluation and management plan which we discussed together. Elderly woman with severe low back and leg pain due to advanced spinal stenosis.  Pain regimen being titrated to minimize sedative effects so that she can better communicate with her family.  Some progress over the last 24 hours stopping Norco, substituting every 6 hours as needed tramadol and increasing fentanyl Transderm patch from 12.5 to 25 mcg every 3 days. 2 sons present.  Status and management plan discussed at length.  They would like to see more aggressive physical therapy.  I am not sure that this is practical due to age and pain restraints. Lab profile stable today. Continue current management.

## 2017-06-17 NOTE — Progress Notes (Signed)
   Subjective:  No acute events overnight.  Patient alert and awake this morning. Making jokes. Sons at bedside. Sons agreeable to continue fentanyl patch and tramadol as patient now appears more alert and is able to engage in conversation for long periods of time.  They would like her to be on this regimen for another 24 hours to monitor her pain.  They would also like patient to gain more strength and work more with physical therapy during his admission.  Objective:  Vital signs in last 24 hours: Vitals:   06/17/17 0000 06/17/17 0355 06/17/17 0800 06/17/17 0900  BP: (!) 113/54 (!) 129/56 (!) 148/80   Pulse: 95 63 72   Resp:      Temp: 98.3 F (36.8 C) 97.7 F (36.5 C) 97.7 F (36.5 C) 97.7 F (36.5 C)  TempSrc: Oral Oral Oral   SpO2:   97%   Weight:      Height:       General: Pleasant female, thin, well-developed, lying in bed in no acute distress though sometimes uncomfortable due to pain CV: Regular rate and rhythm, normal S1/S2, no murmurs, rubs or gallops Pulm: Clear to auscultation, no wheezes or crackles, no increased work of breathing Abd: Soft, nontender, nondistended, normoactive bowel sounds Neuro: Alert this morning and making jokes, can move all 4 extremities spontaneously but limited by pain Ext: Warm and well perfused, no peripheral edema noted   Assessment/Plan:  Lori Ellis is a 83 y.o female with a history of degenerative disc disease who presented to the ED with bilateral radicular pain, LE weakness, and falls. MR of the Lumbar spine illustrated critical spinal stenosis at L2-3, L3-4, and L4-5.  # Lumbar Spinal Stenosis:  Casewas discussedwith Neurosurgery, Dr. Yetta BarreJones who reviewed the patient's imaging and [facility] outpatient follow-up with Dr. Ethelene Halamos. Dr. Yetta BarreJones didnot feel the patient neededemergent surgery. Now working on pain management optimization. - Continue Fentanyl patch 25 mcg  - Continue tramadol 50 mg every 6 H as  needed.  We will continue to monitor pain on this regimen. - Continue gabapentin 300 mg QHS  - Narcan PRN  - Continue PT  -Suspect will be ready for discharge within a couple of days if pain remains well controlled and able to work with physical therapy - Will f/u with SW regarding SNF placement   # Deconditioning  -Continue meal supplements  - Changed in pain medication regimen as above with goal that patient will be more alert and awake to eat  - PT recommending SNF placement   # HTN - Continue Propanolol 20 mg in the AM and 40 mg QHS - HoldingLisinopril 40 mg QD, and HCTZ 25 mg QD   Dispo: Anticipated discharge in approximately 1-3 days pending optimization of pain management regimen.   Burna CashSantos-Sanchez, Marley Pakula, MD 06/17/2017, 12:16 PM Pager: [phone]

## 2017-06-18 NOTE — Progress Notes (Signed)
Physical Therapy Treatment Patient Details Name: Lori Ellis MRN: [id number] DOB: 10/31/1920 Today's Date: 06/18/2017    History of Present Illness 83 year old female with past medical history of chronic low back pain, degenerative disc disease, hypertension and diabetes she presented to the emergency department yesterday due to worsening radicular pain symptoms and difficulty ambulating    PT Comments    Pt with improved participation and mood today. Pt able to tolerate 50' of amb with RW however con't to be limited by onset of low back and L LE pain. Pt with no complaints during LE there ex in chair. Acute PT to con' to follow. Pt remains appropriate for SNF Upon d/c to address the below functional and cognitive deficits for safe transition home.   Follow Up Recommendations  SNF;Supervision/Assistance - 24 hour     Equipment Recommendations  Rolling walker with 5" wheels    Recommendations for Other Services       Precautions / Restrictions Precautions Precautions: Back Precaution Comments: brace for comfort Required Braces or Orthoses: Spinal Brace Spinal Brace: Applied in sitting position Restrictions Weight Bearing Restrictions: No    Mobility  Bed Mobility               General bed mobility comments: pt sitting on BSC in bathroom with OT  Transfers Overall transfer level: Needs assistance Equipment used: Rolling walker (2 wheeled) Transfers: Sit to/from Stand Sit to Stand: Min assist;Mod assist         General transfer comment: max v/c's to push up from chair and reach back, tactile cues to reach back with R hand for safe decent, modA to lower safely into chair, minA to power [facility] from [facility] and maintain balance during transition of hands  Ambulation/Gait Ambulation/Gait assistance: Min assist;Mod assist;+2 safety/equipment Ambulation Distance (Feet): 50 Feet Assistive device: Rolling walker (2 wheeled) Gait Pattern/deviations: Step-through  pattern;Step-to pattern;Decreased stance time - left;Antalgic Gait velocity: slow Gait velocity interpretation: Below normal speed for age/gender General Gait Details: pt initially with upright/erect posture but demo'd increased trunk and bilat knee flexion with ambulation to a point where trunk was parrallel with floor.   Stairs            Wheelchair Mobility    Modified Rankin (Stroke Patients Only)       Balance Overall balance assessment: Needs assistance Sitting-balance support: No upper extremity supported Sitting balance-Leahy Scale: Fair Sitting balance - Comments: due to pain   Standing balance support: Bilateral upper extremity supported Standing balance-Leahy Scale: Poor Standing balance comment: poor ability to maintain upright, flexed posture noted, patient with reports of extreme pain                            Cognition Arousal/Alertness: Awake/alert Behavior During Therapy: Flat affect Overall Cognitive Status: Impaired/Different from baseline Area of Impairment: Following commands                 Orientation Level: Disoriented to;Time;Situation   Memory: Decreased recall of precautions;Decreased short-term memory Following Commands: Follows multi-step commands inconsistently;Follows one step commands with increased time;Follows one step commands consistently Safety/Judgement: Decreased awareness of safety;Decreased awareness of deficits Awareness: Emergent Problem Solving: Decreased initiation;Requires verbal cues;Requires tactile cues General Comments: pt adamant in her ways and directing her treatment      Exercises General Exercises - Lower Extremity Ankle Circles/Pumps: AROM;Both;20 reps;Seated Long Arc Quad: AROM;Both;10 reps;Seated Heel Slides: AROM;Both;20 reps;Seated Hip ABduction/ADduction: AROM;Both;20 reps;Seated  General Comments General comments (skin integrity, edema, etc.): Son present throughout session       Pertinent Vitals/Pain Pain Assessment: Faces Faces Pain Scale: Hurts a little bit Pain Location: L LE Pain Descriptors / Indicators: Grimacing Pain Intervention(s): Monitored during session    Home Living Family/patient expects to be discharged to:: Skilled nursing facility Living Arrangements: Alone Available Help at Discharge: Family;Available PRN/intermittently Type of Home: House Home Access: Level entry   Home Layout: One level Home Equipment: Walker - 2 wheels;Cane - single point      Prior Function Level of Independence: Independent with assistive device(s)      Comments: ADLs, IADLs, not driving. Son brings home groceries and takes her to [facility] apt. using RW PTA   PT Goals (current goals can now be found in the care plan section) Acute Rehab PT Goals Patient Stated Goal: go home Progress towards PT goals: Progressing toward goals    Frequency    Min 3X/week      PT Plan Current plan remains appropriate    Co-evaluation              AM-PAC PT "6 Clicks" Daily Activity  Outcome Measure  Difficulty turning over in bed (including adjusting bedclothes, sheets and blankets)?: Unable Difficulty moving from lying on back to sitting on the side of the bed? : Unable Difficulty sitting down on and standing up from a chair with arms (e.g., wheelchair, bedside commode, etc,.)?: A Lot Help needed moving to and from a bed to chair (including a wheelchair)?: A Lot Help needed walking in hospital room?: A Lot Help needed climbing 3-5 steps with a railing? : Total 6 Click Score: 9    End of Session Equipment Utilized During Treatment: Gait belt;Back brace Activity Tolerance: Patient limited by pain Patient left: in chair;with call bell/phone within reach;with chair alarm set;with family/visitor present Nurse Communication: Mobility status PT Visit Diagnosis: Difficulty in walking, not elsewhere classified (R26.2);Pain Pain - Right/Left: Left Pain - part of body:  Leg     Time: [id number] PT Time Calculation (min) (ACUTE ONLY): 23 min  Charges:  $Gait Training: 8-22 mins $Therapeutic Exercise: 8-22 mins                    G Codes:       Lewis ShockAshly Laurent Cargile, PT, DPT Pager #: [phone] Office #: [phone]    Terry Bolotin M Allison [employer] 06/18/2017, 12:10 PM

## 2017-06-18 NOTE — Progress Notes (Addendum)
0300: Handoff report received from RN. Pt asleep with son at bedside. No s/s of discomfort.   0600: While attempting to give a bath, pt struck out at both care givers. Son was present at bedside and also unable to suggest the pt allow us to bathe her.  0700: Handoff report given to RN. No acute events during my care for this pt.

## 2017-06-18 NOTE — Social Work (Addendum)
CSW spoke with MD, MD is not prepared to discharge pt today (see MD progress note), but will follow up with CSW tomorrow.   CSW followed up with pt first choice [facility]([place]) to see if they would be available to take pt if discharge is tomorrow.   12:55pm- CSW spoke to pt son, he and his brother would like [facility] due to proximity to both pt sons.   CSW will send information to both facilities.    CSW continuing to follow to support discharge to SNF when medically appropriate.   Doy HutchingIsabel H Burna Ellis, LCSWA [facility] Clinical Social Work [phone]

## 2017-06-18 NOTE — Progress Notes (Signed)
Subjective:  No acute events overnight.  Sons Peyton NajjarLarry and Ken at bedside this morning.  They state her mom has been doing well with current pain regimen.  Remains alert, however appetite is not improved.  They expressed frustration with inability to receive physical therapy on a daily basis while in the hospital. They would like patient to be more active during PT. I explained patient is frail and it will take time for her to regain strength and be able to be back to baseline.  They would like for patient to remain in the hospital for a few more days to ensure current pain regimen is adequate and they also asked for daily physical therapy while she is in the hospital.  Spent 30 minutes with family discussing the hospital setting is not appropriate for daily PT and how she would benefit from going to rehab facility where she could actually get therapy more frequently.  I also discussed rehab facilities have healthcare providers that can adjust her pain medication regimen if needed.  However, they insist on keeping patient in the hospital for pain monitoring and more physical therapy.  While I was in the room, I was able to observe patient get out of bed and into a commode with assistance from 2 nurses.  She was able to wipe herself and transfer from the commode to a chair.  She appeared in good spirits and was making jokes.  Had no complaints this morning.  Objective:  Vital signs in last 24 hours: Vitals:   06/17/17 2111 06/17/17 2112 06/17/17 2356 06/18/17 0349  BP: (!) 125/49 (!) 125/49 132/66 (!) 139/96  Pulse: 78  65 67  Resp:      Temp:   98.8 F (37.1 C) 98.5 F (36.9 C)  TempSrc:   Oral Oral  SpO2:   98% 98%  Weight:      Height:       General: Pleasant female, thin and chronically ill-appearing, seen in better spirits this morning, sitting up in chair in no acute distress Cardiac: regular rate and rhythm, nl S1/S2, no murmurs, rubs or gallops  Pulm: CTAB, no wheezes or crackles, no  increased work of breathing  Abd: soft, NTND, bowel sounds present Neuro: A&Ox3, able to move all 4 extremities but lower extremity range of motion limited by pain Ext: warm and well perfused, no peripheral edema   Assessment/Plan:  Lori Ellis is a 83 y.o female with a history of degenerative disc disease who presented to the ED with bilateral radicular pain, LE weakness, and falls. MR of the Lumbar spine illustrated critical spinal stenosis at L2-3, L3-4, and L4-5.  #Lumbar Spinal Stenosis:Casewas discussedwith Neurosurgery, Dr. Yetta BarreJones who reviewed the patient's imaging and [facility] and outpatient follow-up with Dr. Ethelene Halamos. Dr. Yetta BarreJones didnot feel the patient neededemergent surgery. Pain tolerable on current regimen.  Pain medication adjustments limited by sedative side effects.  Has been stable for discharge for a few days now.  Family refuses to pick a rehab facility at this time.  They insist on keeping her in the hospital for pain monitoring and physical therapy.  Please see discussion above.  - Continue Fentanyl patch 25 mcg  - Continue tramadol 50 mg every 6 H as needed.  We will continue to monitor pain on this regimen. - Continue gabapentin 300 mg QHS  - Narcan PRN  - Stable for discharge   #Deconditioning  -Continue meal supplements   - PT recommending SNF placement  -  OT consult   #HTN -ContinuePropanolol 20 mg in the AM and 40 mg QHS -HoldingLisinopril 40 mg QD, and HCTZ 25 mg QD   Dispo: Anticipated discharge in 1-3 days pending pain control management and SNF placement.   Burna CashSantos-Sanchez, Davida Falconi, MD 06/18/2017, 7:05 AM Pager: [phone]

## 2017-06-18 NOTE — Evaluation (Signed)
Occupational Therapy Evaluation Patient Details Name: Lori PateDoris B Reny MRN: [id number] DOB: 12/19/1920 Today's Date: 06/18/2017    History of Present Illness 83 year old female with past medical history of chronic low back pain, degenerative disc disease, hypertension and diabetes she presented to the emergency department yesterday due to worsening radicular pain symptoms and difficulty ambulating   Clinical Impression   PTA, pt was living alone and performing ADLs and simple IADLs. Pt currently required Mod-Max A for UB ADLs, Max A for LB ADLs, and Min A for grooming while seated. Pt with decreased activity tolerance and requiring Max A +2 for functional mobility with RW and requested a rest break during functional mobility to sink for grooming. Pt would benefit form further acute OT to facilitate safe dc. Recommend dc to SNF for further OT to increase safety and independence with ADLs and functional mobility.    Follow Up Recommendations  SNF    Equipment Recommendations  Other (comment)(Defer to next venue)    Recommendations for Other Services       Precautions / Restrictions Precautions Precautions: Back Precaution Comments: brace for comfort Required Braces or Orthoses: Spinal Brace Spinal Brace: Applied in sitting position Restrictions Weight Bearing Restrictions: No      Mobility Bed Mobility               General bed mobility comments: Pt in recliner upon arrival  Transfers Overall transfer level: Needs assistance Equipment used: Rolling walker (2 wheeled) Transfers: Sit to/from Stand Sit to Stand: Mod assist;Max assist         General transfer comment: Pt needing cues for hand placement and then Mod a for first sit<>stand and Max A for second attempt; requiring physical A to power into standing. Pt with tendency for posterior lean throughout trnasfers    Balance Overall balance assessment: Needs assistance Sitting-balance support: No upper extremity  supported;Feet supported Sitting balance-Leahy Scale: Fair Sitting balance - Comments: due to pain   Standing balance support: Bilateral upper extremity supported;During functional activity Standing balance-Leahy Scale: Poor Standing balance comment: poor ability to maintain upright, flexed posture noted, patient with reports of extreme pain                           ADL either performed or assessed with clinical judgement   ADL Overall ADL's : Needs assistance/impaired Eating/Feeding: Minimal assistance;Sitting Eating/Feeding Details (indicate cue type and reason): Min A for visual deficits at baseline Grooming: Minimal assistance;Sitting;Oral care Grooming Details (indicate cue type and reason): Min A for visual deficits. Pt performing oral care while seated on BSC Upper Body Bathing: Moderate assistance;Sitting   Lower Body Bathing: Maximal assistance;Sit to/from stand   Upper Body Dressing : Sitting;Maximal assistance Upper Body Dressing Details (indicate cue type and reason): Max A to don back brace Lower Body Dressing: Maximal assistance;Sit to/from stand   Toilet Transfer: Maximal assistance;Ambulation;BSC;RW Toilet Transfer Details (indicate cue type and reason): Pt requiring Max A for funcitonal mobility to [facility] and then Max cues to descend onto [facility]. Pt fear of falling and with posterior lean throughout transfer         Functional mobility during ADLs: Maximal assistance;+2 for safety/equipment;Rolling walker General ADL Comments: Pt demonstrating  decreased funcitonal performance and required Max A for bathing, dressing, and toileting. Pt requiring rest breaks during funcitonal mobility to sink for grooming     Vision Baseline Vision/History: Macular Degeneration Patient Visual Report: No change from baseline  Perception     Praxis      Pertinent Vitals/Pain Pain Assessment: Faces Faces Pain Scale: Hurts even more Pain Location: L LE Pain  Descriptors / Indicators: Grimacing Pain Intervention(s): Limited activity within patient's tolerance;Monitored during session;Repositioned     Hand Dominance Right   Extremity/Trunk Assessment Upper Extremity Assessment Upper Extremity Assessment: Generalized weakness   Lower Extremity Assessment Lower Extremity Assessment: Defer to PT evaluation LLE Deficits / Details: significant pain radiating down left leg LLE: Unable to fully assess due to pain   Cervical / Trunk Assessment Cervical / Trunk Assessment: Kyphotic   Communication Communication Communication: HOH   Cognition Arousal/Alertness: Awake/alert Behavior During Therapy: Flat affect Overall Cognitive Status: Impaired/Different from baseline Area of Impairment: Orientation;Memory;Safety/judgement;Awareness;Problem solving                 Orientation Level: Disoriented to;Time;Situation   Memory: Decreased recall of precautions;Decreased short-term memory   Safety/Judgement: Decreased awareness of safety;Decreased awareness of deficits Awareness: Emergent Problem Solving: Decreased initiation;Difficulty sequencing;Requires verbal cues;Requires tactile cues;Slow processing General Comments: Pt requiring increased time and cues thorughout session.  Pt with slow processing and needing significant amount of time for ADLs. During grooming, pt requiring cues to termintate one step of task (i.e. brushing teeth or spitting in cup) and then cues to initate next step   General Comments  Son present throughout session    Exercises     Shoulder Instructions      Home Living Family/patient expects to be discharged to:: Skilled nursing facility Living Arrangements: Alone Available Help at Discharge: Family;Available PRN/intermittently Type of Home: House Home Access: Level entry     Home Layout: One level     Bathroom Shower/Tub: Chief Strategy OfficerTub/shower unit   Bathroom Toilet: Standard     Home Equipment: Environmental consultantWalker - 2  wheels;Cane - single point          Prior Functioning/Environment Level of Independence: Independent with assistive device(s)        Comments: ADLs, IADLs, not driving. Son brings home groceries and takes her to [facility] apt. using RW PTA        OT Problem List: Decreased strength;Decreased range of motion;Impaired balance (sitting and/or standing);Decreased activity tolerance;Decreased cognition;Decreased safety awareness;Decreased knowledge of use of DME or AE;Pain      OT Treatment/Interventions: Self-care/ADL training;Therapeutic exercise;Energy conservation;DME and/or AE instruction;Therapeutic activities;Patient/family education    OT Goals(Current goals can be found in the care plan section) Acute Rehab OT Goals Patient Stated Goal: to go home OT Goal Formulation: With patient/family Time For Goal Achievement: 07/02/17 Potential to Achieve Goals: Good ADL Goals Pt Will Perform Grooming: with set-up;with supervision;sitting Pt Will Perform Upper Body Dressing: sitting;with min assist Pt Will Perform Lower Body Dressing: with min assist;sit to/from stand Pt Will Transfer to Toilet: with min assist;ambulating;bedside commode Pt Will Perform Toileting - Clothing Manipulation and hygiene: with min assist;sit to/from stand  OT Frequency: Min 2X/week   Barriers to D/C:            Co-evaluation              AM-PAC PT "6 Clicks" Daily Activity     Outcome Measure Help from another person eating meals?: A Little Help from another person taking care of personal grooming?: A Little Help from another person toileting, which includes using toliet, bedpan, or urinal?: A Lot Help from another person bathing (including washing, rinsing, drying)?: A Lot Help from another person to put on and taking off regular upper body clothing?:  A Lot Help from another person to put on and taking off regular lower body clothing?: A Lot 6 Click Score: 14   End of Session Equipment Utilized  During Treatment: Gait belt;Rolling walker;Back brace Nurse Communication: Mobility status  Activity Tolerance: Patient tolerated treatment well;Patient limited by pain;Patient limited by fatigue Patient left: in chair;with call bell/phone within reach;with family/visitor present(with PT)  OT Visit Diagnosis: Unsteadiness on feet (R26.81);Other abnormalities of gait and mobility (R26.89);Muscle weakness (generalized) (M62.81);Other symptoms and signs involving cognitive function;Pain Pain - Right/Left: Left Pain - part of body: Leg                Time: 1610-9604 OT Time Calculation (min): 43 min Charges:  OT General Charges $OT Visit: 1 Visit OT Evaluation $OT Eval Moderate Complexity: 1 Mod OT Treatments $Self Care/Home Management : 23-37 mins G-Codes:     Treniyah Lynn MSOT, OTR/L Acute Rehab Pager: [phone] Office: [phone]  Theodoro Grist Jefferson Fullam 06/18/2017, 11:58 AM

## 2017-06-18 NOTE — Progress Notes (Signed)
Medicine attending: Clinical status and database reviewed with resident physician Dr. Lovenia KimSantos-Sanchez and I concur with her evaluation and management plan which we discussed together.  No acute change in the patient's condition today.  Ongoing lengthy discussions with her sons who are having a difficult time grasping the situation and remain unrealistic with respect to goals of care.  Dr. Evelene CroonSantos tried to explain to the family that Mrs. Lori Ellis would get much more dedicated physical therapy in an outpatient rehabilitation center than remaining here in the hospital. When I spoke with the sons yesterday, I also reviewed the fact that many patients, especially elderly patients, have a very narrow therapeutic to toxic range with analgesics and that we had to go slowly by trial and error to get her on an optimal regimen but sometimes we need to compromise and except some sedation and/or mild confusion to achieve adequate pain control.

## 2017-06-18 NOTE — Progress Notes (Signed)
1900: Handoff report received from RN. Pt asleep with son at bedside. During assessment, pt expressed desire to not be woken up. Did not participate in assessment.  2100: Pt refusing to take scheduled meds because she "wouldn't wake up to take them at home." She again insisted that I not wake her for any reason.  2300: Pt verbally aggressive towards NT when attempting vitals. Began pulling at lines, attempted to remove PIV. NT called for assistance at which point I suggested mitts to pt's son at bedside; he refused. I attempted to take the pt's temperature as she was frustrated with NT. Pt refused to open her mouth or raise her arms, nor would she allow me to do so passively. When I attempted therapeutic communication with the pt, she refused to make eye contact or engage me whatsoever. Pt's son was present for this encounter.  0000: Pt's son called out on her behalf for restroom assistance. While attempting to help the pt oob to the bsc, pt became verbally aggressive with assisting RN, refusing assistance. When I entered the room, the pt raised her leg, oriented herself in my direction, and said, "I am trying to pee on you." I informed her that I would prefer that she not. Pt urinated in the bed, and as we attempted to remove her wet gown, she grabbed it and tried to wipe it on RNs. She then proceeded to try to get oob, and her son stepped in front of her to block her way. They had a verbal exchange that resulted in the pt ultimately laying back down, calling out in pain, and then refusing to further interact with anyone in the room. We changed her linen and gown. I attempted to administer po pain medication. The pt was not interactive, despite her son's assistance. Pt's son was present for the entire encounter.  0100: Pt's son called out on her behalf stating that she asked for pain medication. Upon entering the room, pt was in bed repeating, "you're letting them/him kill me." It was unclear to me whether  the pt was referring to "them" being healthcare team or "him" being her son. When I attempted to administer po pain medication pt refused to open her mouth and ultimately spat at me. Pt's son was present for this encounter.  0130: Pt calling out of room, "they poisoned him." Still not interacting with healthcare team. Pt found to be wet. Combative during linen change and bath. Pt's son was present for this encounter. I do not feel the pt is appropriate for restraints, as this behavior only presents during active treatment by a member of the healthcare team.  0200-0500: More events such as the ones outlined above.  0600: When attempting to transfer pt to low bed, pt's son asked that we wait for the younger brother to arrive. He believes that the younger brother may ameliorate the pt better than he can, resulting in a more successful transfer. During this time, he expressed his frustration surrounding pt's AMS and his suspicion of medications, specifically fentanyl; he plans to bring this up with the rounding provider.   0700: Handoff report given to RN. Of note, the pt was incontinent of bladder x 6 during this shift.

## 2017-06-19 LAB — BASIC METABOLIC PANEL
Anion gap: 9 (ref 5–15)
BUN: 12 mg/dL (ref 6–20)
CHLORIDE: 103 mmol/L (ref 101–111)
CO2: 24 mmol/L (ref 22–32)
Calcium: 8.7 mg/dL — ABNORMAL LOW (ref 8.9–10.3)
Creatinine, Ser: 0.87 mg/dL (ref 0.44–1.00)
GFR calc Af Amer: >60 mL/min (ref >60)
GFR calc non Af Amer: 55 mL/min — ABNORMAL LOW (ref >60)
GLUCOSE: 207 mg/dL — AB (ref 65–99)
Potassium: 3 mmol/L — ABNORMAL LOW (ref 3.5–5.1)
Sodium: 136 mmol/L (ref 135–145)

## 2017-06-19 MED ORDER — IBUPROFEN 200 MG PO TABS
400.0000 mg | ORAL_TABLET | Freq: Four times a day (QID) | ORAL | Status: DC
  Administered 2017-06-19 – 2017-06-21 (×9): 400 mg via ORAL
  Filled 2017-06-19 (×9): qty 2

## 2017-06-19 NOTE — Progress Notes (Signed)
Subjective:  Patient became verbally aggressive overnight with staff and refused to take PM medications. This morning sons Lori Ellis and Lori Ellis at bedside. They asked for the Fentanyl patch to be stopped as they believe this is causing patient's agitation. Explained to them this is a possibility, especially in an elderly patient, but also possible this is delirium from prolonged hospitalization. Explained there are no other causes to explain altered behavior such as infection or electrolyte abnormalities. They once again expressed frustration with pain control regimen and would like changes in medication. Lori Ellis asked for hydrocodone which patient was previously on ~3 days ago and was discontinued per family request due to somnolence. Once again we emphasized difficulty of coming up with pain regimen as it is unlikely we can achieve complete pain control and maintain patient alert and awake. We recommended palliative care involvement for further recommendations. They were agreeable to this.    Objective:  Vital signs in last 24 hours: Vitals:   06/18/17 2154 06/18/17 2300 06/19/17 0409 06/19/17 0800  BP: (!) 146/64 (!) 158/73 (!) 158/75 (!) 143/52  Pulse: 77 80 72 77  Resp:    16  Temp:   99.6 F (37.6 C) 98.1 F (36.7 C)  TempSrc:   Oral Axillary  SpO2:  94% 94% 95%  Weight:      Height:       General: elderly female that appears chronically ill, thin, sitting up in chair in pain but in no acute distress  Cardiac: regular rate and rhythm, nl S1/S2, no murmurs, rubs or gallops  Abd: soft, NTND, bowel sounds present Neuro:able to move all 4 extremities but LE ROM limited by pain  Ext: warm and well perfused, no peripheral edema    Assessment/Plan:   Lori Ellis is a 84 y.o female with a history of degenerative disc disease who presented to the ED with bilateral radicular pain, LE weakness, and falls. MR of the Lumbar spine illustrated critical spinal stenosis at L2-3, L3-4, and  L4-5.  #Lumbar Spinal Stenosis:Casewas discussedwith Neurosurgery, Dr. Yetta BarreJones who reviewed the patient's imaging and [facility] and outpatient follow-up with Dr. Ethelene Halamos. Dr. Yetta BarreJones didnot feel the patient neededemergent surgery. Fentanyl patch removed overnight per family request due to agitation.  This could certainly be a medication side effect from fentanyl, but  delirium remains a possibility as well given prolonged hospitalization. No signs/symptoms of infection. No electrolyte abnormalities in previous BMP. Will order BMP today. We will stop fentanyl and add ibuprofen as below. Will consult palliative care for further assistance with pain management. Dispo remains uncertain at this time as family insists on keeping patient admitted until she receives more PT and OT as well as optimal pain control resulting in complete pain relief. We have discussed these are unrealistic expectations, but family not receptive to team recommendations.  - Stopping Fentanyl patch 25 mcg secondary to agitation  - Start Ibuprofen 400 mg QID  -Continue tramadol 50 mg every 6 H as needed. - Continue gabapentin 300 mg QHS  - Narcan PRN  - Stable for discharge  - Palliative care consult for further assistance with pain management, appreciate recommendations   #Deconditioning  -Continue meal supplements   - PT and OT recommending SNF placement    #HTN -ContinuePropanolol 20 mg in the AM and 40 mg QHS -HoldingLisinopril 40 mg QD, and HCTZ 25 mg QD   Dispo: Anticipated discharge in approximately ?2-4 day(s) pending optimization of pain regimen.   Burna CashSantos-Sanchez, Jermy Couper, MD 06/19/2017,  9:40 AM Pager: [phone]

## 2017-06-19 NOTE — Social Work (Signed)
CSW presented pt [facility] with [facility].   Pt son much happier with affect today after what pt son describes as a hectic evening.  CSW aware of palliative consult also ordered.   CSW continuing to follow to support discharge when medically appropriate.   Doy HutchingIsabel H Cydnie Deason, LCSWA [facility] Clinical Social Work [phone]

## 2017-06-19 NOTE — Progress Notes (Signed)
Medicine attending: I examined this patient today together with resident physician Dr. Lovenia KimSantos-Sanchez and I concur with her evaluation and management plan which we discussed together. Patient had a episode of agitation during the night.  We had recently increased her Duragesic dose and added as needed tramadol.  She is on no other sedatives or anxiolytics. She is very withdrawn today, keeps her eyes closed, but does respond to questions and commands appropriately.  I think she is depressed. No focal deficits on exam.  Oxygenating well on room air.  Afebrile and no sign of active infection. Impression: Chronic pain syndrome due to spinal stenosis and degenerative disease of the spine in a very old woman. Daily lengthy discussions with her 2 sons.  One son feels she has had episodes of agitation before we even started the Duragesic.  Really not clear at this point whether we are seeing a "sundown phenomenon" or medication reaction.  I told the sons I still favor stopping the Duragesic and allowing at least 72 hours for her to get out of her system.  We are going to start a nonsteroidal, ibuprofen, 400 mg every 6 hours.  Sons were previously told that she should not be put on nonsteroidals because of potential renal damage.  I explained to them that her current renal function is normal and short-term use under direct hospital observation is safe and may allow us to get pain control with out narcotic analgesics. We are going to proceed with a palliative care consultation to help us deal with her family with respect to goals of care and reasonable expectations of outcomes and to assist with pain control management.

## 2017-06-20 DIAGNOSIS — I10 Essential (primary) hypertension: Secondary | ICD-10-CM

## 2017-06-20 LAB — BASIC METABOLIC PANEL
ANION GAP: 10 (ref 5–15)
BUN: 13 mg/dL (ref 6–20)
CHLORIDE: 104 mmol/L (ref 101–111)
CO2: 25 mmol/L (ref 22–32)
Calcium: 8.7 mg/dL — ABNORMAL LOW (ref 8.9–10.3)
Creatinine, Ser: 0.91 mg/dL (ref 0.44–1.00)
GFR calc Af Amer: >60 mL/min (ref >60)
GFR, EST NON AFRICAN AMERICAN: 52 mL/min — AB (ref >60)
GLUCOSE: 113 mg/dL — AB (ref 65–99)
POTASSIUM: 3.3 mmol/L — AB (ref 3.5–5.1)
Sodium: 139 mmol/L (ref 135–145)

## 2017-06-20 MED ORDER — ACETAMINOPHEN 325 MG PO TABS
650.0000 mg | ORAL_TABLET | Freq: Four times a day (QID) | ORAL | Status: DC | PRN
  Administered 2017-06-21: 650 mg via ORAL
  Filled 2017-06-20: qty 2

## 2017-06-20 MED ORDER — POTASSIUM CHLORIDE CRYS ER 20 MEQ PO TBCR
40.0000 meq | EXTENDED_RELEASE_TABLET | Freq: Two times a day (BID) | ORAL | Status: DC
  Administered 2017-06-20 – 2017-06-21 (×3): 40 meq via ORAL
  Filled 2017-06-20 (×3): qty 2

## 2017-06-20 NOTE — Progress Notes (Signed)
Medicine attending: I examined this patient today together with resident physician Dr. Lovenia KimSantos-Sanchez and I concur with her evaluation and management plan which we discussed together. Significant improvement overall today.  She is talkative.  Interactive.  Sitting in a chair and feeding herself.  Off Duragesic for over 48 hours.  On scheduled ibuprofen with as needed tramadol.  She took no pain medication during the night and only 1 tramadol so far today.  Pain level is stable.  She is unable to quantitate but appears comfortable.  Blood pressure running lower than her baseline.  Antihypertensive regimen adjusted.  Lungs are clear.  Motor strength lower extremities remains normal.  Lab profile stable.  Potassium replacement in progress for mild hypokalemia.  Renal function remains normal. Ongoing discussion with patient's son with respect to discharge planning.  If patient maintains current level of stability sons are comfortable with discharge tomorrow if they find a nursing facility that is acceptable to them.  Late entry from social work and a [facility] which they will hold for the patient with anticipation for discharge tomorrow.

## 2017-06-20 NOTE — Progress Notes (Signed)
Physical Therapy Treatment Patient Details Name: Lori Ellis MRN: [id number] DOB: [birth year] Today's Date: 06/20/2017    History of Present Illness 84 year old female with past medical history of chronic low back pain, degenerative disc disease, hypertension and diabetes she presented to the emergency department yesterday due to worsening radicular pain symptoms and difficulty ambulating    PT Comments    Pt progressing towards physical therapy goals. Was eager to get OOB with therapy and overall demonstrated a good rehab effort. Tolerance for functional activity remains low, however appears to be improving. Will continue to follow and progress as able per POC.    Follow Up Recommendations  SNF;Supervision/Assistance - 24 hour     Equipment Recommendations  Rolling walker with 5" wheels    Recommendations for Other Services       Precautions / Restrictions Precautions Precautions: Back;Fall Precaution Comments: brace for comfort Required Braces or Orthoses: Spinal Brace Spinal Brace: Applied in sitting position Restrictions Weight Bearing Restrictions: No    Mobility  Bed Mobility Overal bed mobility: Needs Assistance Bed Mobility: Sidelying to Sit   Sidelying to sit: Min assist       General bed mobility comments: Pt in sidelying when PT arrived. Assist provided for LE advancement off EOB and for trunk elevation to full sitting position. Pt required increased time to achieve full upright posture due to pain in LE's. She was able to scoot to EOB fully without assistance.   Transfers Overall transfer level: Needs assistance Equipment used: Rolling walker (2 wheeled) Transfers: Sit to/from Stand Sit to Stand: Min assist         General transfer comment: VC's for hand placement on seated surface for safety. Pt was able to perform transfers from EOB, BSC, and recliner chair with min assist for power up to full stand.   Ambulation/Gait Ambulation/Gait assistance:  Min assist;Mod assist;+2 safety/equipment Ambulation Distance (Feet): 50 Feet Assistive device: Rolling walker (2 wheeled) Gait Pattern/deviations: Step-through pattern;Decreased stride length;Trunk flexed Gait velocity: Decreased Gait velocity interpretation: Below normal speed for age/gender General Gait Details: VC's for improved posture as trunk was extremely flexed. Pt almost resting R forearm on walker handle. Pt ambulated in room to/from bathroom and then out to the hall. 1 seated rest after bathroom trip.    Stairs            Wheelchair Mobility    Modified Rankin (Stroke Patients Only)       Balance Overall balance assessment: Needs assistance Sitting-balance support: No upper extremity supported Sitting balance-Leahy Scale: Fair Sitting balance - Comments: due to pain   Standing balance support: Bilateral upper extremity supported Standing balance-Leahy Scale: Poor Standing balance comment: poor ability to maintain upright, flexed posture noted, patient with reports of extreme pain                            Cognition Arousal/Alertness: Awake/alert Behavior During Therapy: Flat affect Overall Cognitive Status: Impaired/Different from baseline                       Memory: Decreased short-term memory Following Commands: Follows one step commands consistently;Follows one step commands with increased time;Follows multi-step commands inconsistently Safety/Judgement: Decreased awareness of safety Awareness: Emergent Problem Solving: Decreased initiation;Requires verbal cues;Requires tactile cues        Exercises      General Comments General comments (skin integrity, edema, etc.): Son and husband present throughout session.  Pertinent Vitals/Pain Pain Assessment: Faces Faces Pain Scale: Hurts even more Pain Location: BLE's L>R Pain Descriptors / Indicators: Grimacing;Guarding Pain Intervention(s): Limited activity within  patient's tolerance;Monitored during session;Repositioned    Home Living                      Prior Function            PT Goals (current goals can now be found in the care plan section) Acute Rehab PT Goals Patient Stated Goal: go home PT Goal Formulation: With patient/family Time For Goal Achievement: 06/27/17 Potential to Achieve Goals: Fair Progress towards PT goals: Progressing toward goals    Frequency    Min 3X/week      PT Plan Current plan remains appropriate    Co-evaluation              AM-PAC PT "6 Clicks" Daily Activity  Outcome Measure  Difficulty turning over in bed (including adjusting bedclothes, sheets and blankets)?: Unable Difficulty moving from lying on back to sitting on the side of the bed? : Unable Difficulty sitting down on and standing up from a chair with arms (e.g., wheelchair, bedside commode, etc,.)?: A Lot Help needed moving to and from a bed to chair (including a wheelchair)?: A Lot Help needed walking in hospital room?: A Lot Help needed climbing 3-5 steps with a railing? : Total 6 Click Score: 9    End of Session Equipment Utilized During Treatment: Gait belt;Back brace Activity Tolerance: Patient limited by pain Patient left: in chair;with call bell/phone within reach;with chair alarm set;with family/visitor present Nurse Communication: Mobility status PT Visit Diagnosis: Difficulty in walking, not elsewhere classified (R26.2);Pain Pain - Right/Left: Left Pain - part of body: Leg     Time: [id number] PT Time Calculation (min) (ACUTE ONLY): 22 min  Charges:  $Gait Training: 8-22 mins                    G Codes:       Lori SlipperLaura Alister Ellis, PT, DPT Acute Rehabilitation Services Pager: [phone]    Lori PearsonLaura D Saivon Prowse 06/20/2017, 9:24 AM

## 2017-06-20 NOTE — Progress Notes (Signed)
Palliative Medicine consult noted. Due to high referral volume, there may be a delay seeing this patient. Please call the Palliative Medicine Team office at [phone] if recommendations are needed in the interim.  Thank you for inviting us to see this patient.  Margret ChanceMelanie G. Rozell Theiler, RN, BSN, [facility] 06/20/2017 9:31 AM Office [phone]

## 2017-06-20 NOTE — Social Work (Addendum)
CSW met with pt son, they will not chose a SNF definitively until MD has seen pt. CSW explained that palliative may not be able to see pt prior to discharge due to high amount of consults. CSW let pt son know that palliative consults can happen at SNF of choice. Pt son stated "we were expecting that to happen here." CSW validated that concern, but again told pt son that consult could occur at SNF.  CSW also explained that in order for [employer] to complete authorization they would have to know what facility family was interested in. Pt son asked me to follow up with [facility] regarding bed availability, and [facility] as well.   CSW initiated authorization with [employer], waiting responses from SNFs.   1:48pm- [facility] able to take pt, would need authorization number and confirmation from sons regarding discharge.   3:13pm- CSW spoke with [employer], confirmed [facility], they are completing authorization. [facility] aware that pt will be staying another night, they are holding bed for pt for discharge tomorrow.   4:41pm- CSW has received insurance authorization from [facility] [phone]). Pt will be ready to discharge to [facility] in the morning whenever medical team decides pt is stable.  CSW continuing to follow.   Alexander Mt, [place] Work [phone]

## 2017-06-20 NOTE — Progress Notes (Signed)
Patient is calm and lying in the bed with eyes closed, pt was assisted up to the BR with walker and assist X1 twice this shift. Patient is calm and cooperative. No aggressive behaviors noted. Patient's son remains at the bedside. Patient is on a low bed and bed alarm is on for safety. Will continue to monitor.

## 2017-06-20 NOTE — Progress Notes (Signed)
   Subjective:  No acute events overnight.  Patient now more alert and awake after removal of fentanyl patch 1.5 days ago.  She was sitting up in chair and eating by herself when seen on rounds.  Her son Peyton NajjarLarry was present in the room.  Stated she was able to go to the bathroom and brushed her teeth and has been behaving like herself today.  He is very happy with her progress today but states he will like her to be monitored for another 24 hours to ensure no future changes in behavior will occur.  Discussed that she has a SNF bed available and is stable for discharge, but son declined the offer.  We also discussed continuing current pain regimen with ibuprofen every 6 hours and tramadol every 6 hours as needed.Son agreeable to this.  Objective:  Vital signs in last 24 hours: Vitals:   06/19/17 1955 06/20/17 0020 06/20/17 0837 06/20/17 1213  BP: (!) 138/58 (!) 167/75 (!) 154/68 110/60  Pulse: 65  69 64  Resp:  18  16  Temp: (!) 97.4 F (36.3 C) 98.3 F (36.8 C) 99.2 F (37.3 C) 98.3 F (36.8 C)  TempSrc: Axillary Oral Oral Oral  SpO2: 98%  98% 99%  Weight:      Height:       General: A female that appears chronically ill, well-developed, sitting up in chair eating lunch, in no acute distress Cardiac: regular rate and rhythm, nl S1/S2, no murmurs, rubs or gallops  Pulm: CTAB, no wheezes or crackles, no increased work of breathing  Abd: soft, NTND, bowel sounds present Neuro: A&Ox3, lower extremity range of motion limited by pain but strength is intact Ext: warm and well perfused, no peripheral edema noted    Assessment/Plan:  Vickii PennaDoris Schutt is a 84 y.o female with a history of degenerative disc disease who presented to the ED with bilateral radicular pain, LE weakness, and falls. MR of the Lumbar spine illustrated critical spinal stenosis at L2-3, L3-4, and L4-5.  #Lumbar Spinal Stenosis:Casewas discussedwith Neurosurgery, Dr. Yetta BarreJones who reviewed imaging and [facility] and outpatient follow-up with Dr. Ethelene Halamos.  Emergent surgery not indicated.  Patient is behaving like her usual self after removal of fentanyl patch 1.5 days ago.  SNF bed available today and family made aware but declines offer as they would like patient's mental status to be monitored overnight. Will monitor mental status and behavior for 24 hours and discharge to SNF tomorrow if remains stable. Son seemed in agreement with plan. Palliative care to see patient tomorrow.  - Continue Ibuprofen 400 mg QID  -Continue tramadol 50 mg every 6 H as needed. - Continue gabapentin 300 mg QHS  - Stable for discharge - Palliative care consult for further assistance with pain management and GOc discussion, appreciate recommendations   #Deconditioning  -Continue meal supplements  - PT and OT recommending SNF placement  #HTN -ContinuePropanolol 20 mg in the AM and 40 mg QHS -HoldingLisinopril 40 mg QD, and HCTZ 25 mg QD   Dispo: Anticipated discharge in approximately 1-2 day(s).  Monitoring behavior for 24 hours if stable discharge to SNF tomorrow.  Burna CashSantos-Sanchez, Rosalie Buenaventura, MD 06/20/2017, 1:12 PM Pager: [phone]

## 2017-06-20 NOTE — Progress Notes (Signed)
Patient is lying quietly in the bed with eyes closed, son is requesting morning Heparin SQ be held until next meds are due at 0800. Will give next shift report regarding Heparin. Patient remains calm this shift with no issues. Patient son had requested VS and labs to be done when patient awakes. Will cont to monitor.

## 2017-06-21 DIAGNOSIS — R262 Difficulty in walking, not elsewhere classified: Secondary | ICD-10-CM | POA: Diagnosis not present

## 2017-06-21 DIAGNOSIS — F419 Anxiety disorder, unspecified: Secondary | ICD-10-CM | POA: Diagnosis not present

## 2017-06-21 DIAGNOSIS — E039 Hypothyroidism, unspecified: Secondary | ICD-10-CM | POA: Diagnosis not present

## 2017-06-21 DIAGNOSIS — R52 Pain, unspecified: Secondary | ICD-10-CM | POA: Diagnosis not present

## 2017-06-21 DIAGNOSIS — Z9181 History of falling: Secondary | ICD-10-CM | POA: Diagnosis not present

## 2017-06-21 DIAGNOSIS — M48061 Spinal stenosis, lumbar region without neurogenic claudication: Secondary | ICD-10-CM | POA: Diagnosis not present

## 2017-06-21 DIAGNOSIS — M5116 Intervertebral disc disorders with radiculopathy, lumbar region: Secondary | ICD-10-CM | POA: Diagnosis not present

## 2017-06-21 DIAGNOSIS — M25551 Pain in right hip: Secondary | ICD-10-CM | POA: Diagnosis not present

## 2017-06-21 DIAGNOSIS — M4807 Spinal stenosis, lumbosacral region: Secondary | ICD-10-CM | POA: Diagnosis not present

## 2017-06-21 DIAGNOSIS — Z79899 Other long term (current) drug therapy: Secondary | ICD-10-CM | POA: Diagnosis not present

## 2017-06-21 DIAGNOSIS — M5117 Intervertebral disc disorders with radiculopathy, lumbosacral region: Secondary | ICD-10-CM | POA: Diagnosis not present

## 2017-06-21 DIAGNOSIS — E1122 Type 2 diabetes mellitus with diabetic chronic kidney disease: Secondary | ICD-10-CM

## 2017-06-21 DIAGNOSIS — M5416 Radiculopathy, lumbar region: Secondary | ICD-10-CM | POA: Diagnosis not present

## 2017-06-21 DIAGNOSIS — I129 Hypertensive chronic kidney disease with stage 1 through stage 4 chronic kidney disease, or unspecified chronic kidney disease: Secondary | ICD-10-CM | POA: Diagnosis not present

## 2017-06-21 DIAGNOSIS — R110 Nausea: Secondary | ICD-10-CM | POA: Diagnosis not present

## 2017-06-21 DIAGNOSIS — Z111 Encounter for screening for respiratory tuberculosis: Secondary | ICD-10-CM | POA: Diagnosis not present

## 2017-06-21 DIAGNOSIS — N183 Chronic kidney disease, stage 3 (moderate): Secondary | ICD-10-CM | POA: Diagnosis not present

## 2017-06-21 DIAGNOSIS — R279 Unspecified lack of coordination: Secondary | ICD-10-CM | POA: Diagnosis not present

## 2017-06-21 DIAGNOSIS — M4800 Spinal stenosis, site unspecified: Secondary | ICD-10-CM | POA: Diagnosis not present

## 2017-06-21 DIAGNOSIS — Z880 Allergy status to penicillin: Secondary | ICD-10-CM | POA: Diagnosis not present

## 2017-06-21 DIAGNOSIS — I10 Essential (primary) hypertension: Secondary | ICD-10-CM | POA: Diagnosis not present

## 2017-06-21 DIAGNOSIS — E876 Hypokalemia: Secondary | ICD-10-CM | POA: Diagnosis not present

## 2017-06-21 DIAGNOSIS — E119 Type 2 diabetes mellitus without complications: Secondary | ICD-10-CM | POA: Diagnosis not present

## 2017-06-21 DIAGNOSIS — K5900 Constipation, unspecified: Secondary | ICD-10-CM | POA: Diagnosis not present

## 2017-06-21 DIAGNOSIS — M6281 Muscle weakness (generalized): Secondary | ICD-10-CM | POA: Diagnosis not present

## 2017-06-21 DIAGNOSIS — R5381 Other malaise: Secondary | ICD-10-CM | POA: Diagnosis not present

## 2017-06-21 DIAGNOSIS — M549 Dorsalgia, unspecified: Secondary | ICD-10-CM | POA: Diagnosis not present

## 2017-06-21 DIAGNOSIS — E785 Hyperlipidemia, unspecified: Secondary | ICD-10-CM | POA: Diagnosis not present

## 2017-06-21 DIAGNOSIS — G8929 Other chronic pain: Secondary | ICD-10-CM | POA: Diagnosis not present

## 2017-06-21 DIAGNOSIS — G8911 Acute pain due to trauma: Secondary | ICD-10-CM | POA: Diagnosis not present

## 2017-06-21 DIAGNOSIS — S32000D Wedge compression fracture of unspecified lumbar vertebra, subsequent encounter for fracture with routine healing: Secondary | ICD-10-CM | POA: Diagnosis not present

## 2017-06-21 LAB — BASIC METABOLIC PANEL
Anion gap: 6 (ref 5–15)
BUN: 13 mg/dL (ref 6–20)
CALCIUM: 8.6 mg/dL — AB (ref 8.9–10.3)
CO2: 27 mmol/L (ref 22–32)
CREATININE: 0.89 mg/dL (ref 0.44–1.00)
Chloride: 105 mmol/L (ref 101–111)
GFR calc Af Amer: >60 mL/min (ref >60)
GFR, EST NON AFRICAN AMERICAN: 54 mL/min — AB (ref >60)
GLUCOSE: 115 mg/dL — AB (ref 65–99)
Potassium: 3.6 mmol/L (ref 3.5–5.1)
Sodium: 138 mmol/L (ref 135–145)

## 2017-06-21 MED ORDER — POLYETHYLENE GLYCOL 3350 17 G PO PACK: 17.0000 g | PACK | Freq: Every day | ORAL | 0 refills | Status: DC

## 2017-06-21 MED ORDER — IBUPROFEN 400 MG PO TABS: 400.0000 mg | ORAL_TABLET | Freq: Four times a day (QID) | ORAL | 0 refills | Status: DC

## 2017-06-21 MED ORDER — LISINOPRIL 10 MG PO TABS: 10.0000 mg | ORAL_TABLET | Freq: Every day | ORAL | 0 refills | Status: DC

## 2017-06-21 MED ORDER — SENNOSIDES-DOCUSATE SODIUM 8.6-50 MG PO TABS: 1.0000 | ORAL_TABLET | Freq: Two times a day (BID) | ORAL | 0 refills | Status: DC

## 2017-06-21 MED ORDER — ACETAMINOPHEN 325 MG PO TABS: 650.0000 mg | ORAL_TABLET | Freq: Four times a day (QID) | ORAL | 0 refills | Status: DC | PRN

## 2017-06-21 MED ORDER — PRO-STAT SUGAR FREE PO LIQD: 30.0000 mL | Freq: Every day | ORAL | 0 refills | Status: DC

## 2017-06-21 MED ORDER — TRAMADOL HCL 50 MG PO TABS: 50.0000 mg | ORAL_TABLET | Freq: Four times a day (QID) | ORAL | 0 refills | Status: DC | PRN

## 2017-06-21 NOTE — Progress Notes (Signed)
   Subjective:  No acute events overnight.  Patient sitting up in chair this morning breakfast by herself.  She had just work with OT prior to rounds and did very well.  Family present at bedside and feel comfortable discharging patient to rehab facility.  They are concerned she continues to have breakthrough pain.  Discussed with family they can ask for pain medication 30 minutes prior to starting daily activities to allow her to be more active during the day.  Family voiced understanding and is in agreement with plan.  All questions were answered.  Objective:  Vital signs in last 24 hours: Vitals:   06/20/17 1656 06/20/17 2023 06/21/17 0234 06/21/17 0808  BP: (!) 158/59 (!) 154/66 (!) 173/64 (!) 122/49  Pulse: 66 73 74 66  Resp: 18 18 18    Temp: 98.6 F (37 C) 98.5 F (36.9 C) 98.5 F (36.9 C) 98.4 F (36.9 C)  TempSrc: Oral Oral Oral Oral  SpO2: 99% 100% 99% 96%  Weight:      Height:       General: Pleasant female, thin, well-developed, sitting up in chair in no acute distress Cardiac: regular rate and rhythm, nl S1/S2, no murmurs, rubs or gallops  Pulm: CTAB, no wheezes or crackles, no increased work of breathing  Abd: soft, NTND, bowel sounds present Neuro: Lower extremity range of motion limited by pain but strength remains intact Ext: warm and well perfused, no peripheral edema    Assessment/Plan:  Lori Ellis is a 84 y.o female with a history of degenerative disc disease who presented to the ED with bilateral radicular pain, LE weakness, and falls. MR of the Lumbar spine illustrated critical spinal stenosis at L2-3, L3-4, and L4-5.  #Lumbar Spinal Stenosis:Casewas discussedwith Neurosurgery, Dr. Yetta BarreJones who reviewed imaging and [facility] and outpatient follow-up with Dr. Ethelene Halamos.  Emergent surgery not indicated.  Patient is behaving like her usual self after removal of fentanyl patch 2 days ago.  Continues to do well and stable for discharge to SNF.   Family agreeable to this. - Continue Ibuprofen 400 mg QID -Continue tramadol 50 mg every 6 H as needed. - Continue gabapentin 300 mg QHS  - Stable for discharge  #Deconditioning  -Continue meal supplements  - PTand OTrecommending SNF placement  #HTN -ContinuePropanolol 20 mg in the AM and 40 mg QHS - Resume home lisinopril at 10 mg QD  -HoldingHCTZ 25 mg QD  Dispo: Anticipated discharge today.   Burna CashSantos-Sanchez, Najat Olazabal, MD 06/21/2017, 10:43 AM Pager: [phone]

## 2017-06-21 NOTE — Social Work (Signed)
Clinical Social Worker facilitated patient discharge including contacting patient family and facility to confirm patient discharge plans.  Clinical information faxed to facility and family agreeable with plan.  CSW arranged ambulance transport via [facility].  RN to call [phone] with report prior to discharge.  Clinical Social Worker will sign off for now as social work intervention is no longer needed. Please consult us again if new need arises.  Lori Ellis, LCSWA Clinical Social Worker

## 2017-06-21 NOTE — Progress Notes (Signed)
Report given to Lisa at [facility] 

## 2017-06-21 NOTE — Social Work (Signed)
Pt has bed available and insurance authorization, pt is good to discharge to SNF when medically appropriate.   CSW continuing to follow to support [facility].   Doy HutchingIsabel H Atiya Yera, LCSWA [facility] Clinical Social Work [phone]

## 2017-06-21 NOTE — Clinical Social Work Placement (Signed)
   CLINICAL SOCIAL WORK PLACEMENT  NOTE [facility]  Date:  06/21/2017  Patient Details  Name: [name] MRN: [id number] Date of Birth: 11/19/1920  Clinical Social Work is seeking post-discharge placement for this patient at the Skilled  Nursing Facility level of care (*CSW will initial, date and re-position this form in  chart as items are completed):  Yes   Patient/family provided with [place] Clinical Social Work Department's list of facilities offering this level of care within the geographic area requested by the patient (or if unable, by the patient's family).  Yes   Patient/family informed of their freedom to choose among providers that offer the needed level of care, that participate in Medicare, Medicaid or managed care program needed by the patient, have an available bed and are willing to accept the patient.  Yes   Patient/family informed of [facility]'s ownership interest in [facility] and [facility], as well as of the fact that they are under no obligation to receive care at these facilities.  PASRR submitted to EDS on       PASRR number received on       Existing PASRR number confirmed on 06/18/17     FL2 transmitted to all facilities in geographic area requested by pt/family on 06/18/17     FL2 transmitted to all facilities within larger geographic area on       Patient informed that his/her managed care company has contracts with or will negotiate with certain facilities, including the following:        Yes   Patient/family informed of bed offers received.  [facility]     Physician recommends and patient chooses bed at      [facility].  Patient to be transferred to facility by [facility]     Patient family notified on 06/21/17 of transfer.  Name of family member notified:  Lori Ellis; son      PHYSICIAN Please prepare priority discharge summary, including medications      Additional Comment:    _______________________________________________ Doy HutchingIsabel H Domingue [place]train, LCSWA 06/21/2017, 11:10 AM

## 2017-06-21 NOTE — Progress Notes (Signed)
Occupational Therapy Treatment Patient Details Name: Lori PateDoris B Ellis MRN: [id number] DOB: 04/21/1921 Today's Date: 06/21/2017    History of present illness 84 year old female with past medical history of chronic low back pain, degenerative disc disease, hypertension and diabetes she presented to the emergency department yesterday due to worsening radicular pain symptoms and difficulty ambulating   OT comments  Pt making good progress toward OT goals this session. Able to perform toilet transfer ambulating to bathroom, grooming in standing, and peri care with min assist today. D/c plan remains appropriate. Will continue to follow acutely.   Follow Up Recommendations  SNF    Equipment Recommendations  Other (comment)(TBD at next venue)    Recommendations for Other Services      Precautions / Restrictions Precautions Precautions: Back;Fall Precaution Comments: brace for comfort Required Braces or Orthoses: Spinal Brace Spinal Brace: Applied in sitting position Restrictions Weight Bearing Restrictions: No       Mobility Bed Mobility Overal bed mobility: Needs Assistance Bed Mobility: Rolling;Sidelying to Sit Rolling: Supervision Sidelying to sit: Min assist       General bed mobility comments: Light min assist for trunk elevation to sitting. Increased time required. HOB flat with use of bed rails  Transfers Overall transfer level: Needs assistance Equipment used: Rolling walker (2 wheeled) Transfers: Sit to/from Stand Sit to Stand: Min assist         General transfer comment: Good hand placement and technique. Min assist to boost up and for balance in standing    Balance Overall balance assessment: Needs assistance Sitting-balance support: Feet supported;No upper extremity supported Sitting balance-Leahy Scale: Fair     Standing balance support: No upper extremity supported;During functional activity Standing balance-Leahy Scale: Poor Standing balance comment:  min assist for balance without UE support                           ADL either performed or assessed with clinical judgement   ADL Overall ADL's : Needs assistance/impaired Eating/Feeding: Minimal assistance;Sitting Eating/Feeding Details (indicate cue type and reason): Set up with breakfast tray at end of session. Son assisting with feeding as needed. Grooming: Minimal assistance;Standing;Wash/dry hands Grooming Details (indicate cue type and reason): Min assist for standing balance                 Toilet Transfer: Minimal assistance;Ambulation;BSC;RW   Toileting- Clothing Manipulation and Hygiene: Minimal assistance;Sit to/from stand Toileting - Clothing Manipulation Details (indicate cue type and reason): Assist for standing balance during peri care     Functional mobility during ADLs: Minimal assistance;Rolling walker       Vision       Perception     Praxis      Cognition Arousal/Alertness: Awake/alert Behavior During Therapy: WFL for tasks assessed/performed Overall Cognitive Status: Within Functional Limits for tasks assessed                                          Exercises     Shoulder Instructions       General Comments      Pertinent Vitals/ Pain       Pain Assessment: Faces Faces Pain Scale: Hurts even more Pain Location: all over Pain Descriptors / Indicators: Aching;Discomfort;Grimacing Pain Intervention(s): Monitored during session;Limited activity within patient's tolerance;Repositioned  Home Living  Prior Functioning/Environment              Frequency  Min 2X/week        Progress Toward Goals  OT Goals(current goals can now be found in the care plan section)  Progress towards OT goals: Progressing toward goals  Acute Rehab OT Goals Patient Stated Goal: decrease pain OT Goal Formulation: With patient  Plan Discharge plan remains  appropriate    Co-evaluation                 AM-PAC PT "6 Clicks" Daily Activity     Outcome Measure   Help from another person eating meals?: A Little Help from another person taking care of personal grooming?: A Little Help from another person toileting, which includes using toliet, bedpan, or urinal?: A Little Help from another person bathing (including washing, rinsing, drying)?: A Lot Help from another person to put on and taking off regular upper body clothing?: A Little Help from another person to put on and taking off regular lower body clothing?: A Lot 6 Click Score: 16    End of Session Equipment Utilized During Treatment: Gait belt;Rolling walker  OT Visit Diagnosis: Unsteadiness on feet (R26.81);Other abnormalities of gait and mobility (R26.89);Muscle weakness (generalized) (M62.81);Other symptoms and signs involving cognitive function;Pain Pain - part of body: ("all over")   Activity Tolerance Patient tolerated treatment well   Patient Left in chair;with call bell/phone within reach;with family/visitor present   Nurse Communication Mobility status        Time: 2956-2130 OT Time Calculation (min): 15 min  Charges: OT General Charges $OT Visit: 1 Visit OT Treatments $Self Care/Home Management : 8-22 mins  Lori Ellis A. Lori Ellis, M.S., OTR/L Pager: [phone]   Lori Ellis 06/21/2017, 9:36 AM

## 2017-06-22 DIAGNOSIS — N183 Chronic kidney disease, stage 3 (moderate): Secondary | ICD-10-CM | POA: Diagnosis not present

## 2017-06-22 DIAGNOSIS — M4800 Spinal stenosis, site unspecified: Secondary | ICD-10-CM | POA: Diagnosis not present

## 2017-06-22 DIAGNOSIS — E119 Type 2 diabetes mellitus without complications: Secondary | ICD-10-CM | POA: Diagnosis not present

## 2017-06-22 DIAGNOSIS — R52 Pain, unspecified: Secondary | ICD-10-CM | POA: Diagnosis not present

## 2017-06-22 DIAGNOSIS — E785 Hyperlipidemia, unspecified: Secondary | ICD-10-CM | POA: Diagnosis not present

## 2017-06-22 DIAGNOSIS — K5900 Constipation, unspecified: Secondary | ICD-10-CM | POA: Diagnosis not present

## 2017-06-22 DIAGNOSIS — E039 Hypothyroidism, unspecified: Secondary | ICD-10-CM | POA: Diagnosis not present

## 2017-06-22 DIAGNOSIS — M6281 Muscle weakness (generalized): Secondary | ICD-10-CM | POA: Diagnosis not present

## 2017-06-22 DIAGNOSIS — I10 Essential (primary) hypertension: Secondary | ICD-10-CM | POA: Diagnosis not present

## 2017-06-25 DIAGNOSIS — E119 Type 2 diabetes mellitus without complications: Secondary | ICD-10-CM | POA: Diagnosis not present

## 2017-06-25 DIAGNOSIS — R52 Pain, unspecified: Secondary | ICD-10-CM | POA: Diagnosis not present

## 2017-06-25 DIAGNOSIS — I10 Essential (primary) hypertension: Secondary | ICD-10-CM | POA: Diagnosis not present

## 2017-06-25 DIAGNOSIS — M4800 Spinal stenosis, site unspecified: Secondary | ICD-10-CM | POA: Diagnosis not present

## 2017-06-25 DIAGNOSIS — N183 Chronic kidney disease, stage 3 (moderate): Secondary | ICD-10-CM | POA: Diagnosis not present

## 2017-06-25 DIAGNOSIS — K5900 Constipation, unspecified: Secondary | ICD-10-CM | POA: Diagnosis not present

## 2017-06-25 DIAGNOSIS — E039 Hypothyroidism, unspecified: Secondary | ICD-10-CM | POA: Diagnosis not present

## 2017-06-25 DIAGNOSIS — E785 Hyperlipidemia, unspecified: Secondary | ICD-10-CM | POA: Diagnosis not present

## 2017-06-25 DIAGNOSIS — M6281 Muscle weakness (generalized): Secondary | ICD-10-CM | POA: Diagnosis not present

## 2017-06-26 DIAGNOSIS — R52 Pain, unspecified: Secondary | ICD-10-CM | POA: Diagnosis not present

## 2017-06-26 DIAGNOSIS — M25551 Pain in right hip: Secondary | ICD-10-CM | POA: Diagnosis not present

## 2017-06-27 DIAGNOSIS — M4800 Spinal stenosis, site unspecified: Secondary | ICD-10-CM | POA: Diagnosis not present

## 2017-06-27 DIAGNOSIS — R52 Pain, unspecified: Secondary | ICD-10-CM | POA: Diagnosis not present

## 2017-06-27 DIAGNOSIS — K5900 Constipation, unspecified: Secondary | ICD-10-CM | POA: Diagnosis not present

## 2017-06-27 DIAGNOSIS — N183 Chronic kidney disease, stage 3 (moderate): Secondary | ICD-10-CM | POA: Diagnosis not present

## 2017-06-27 DIAGNOSIS — E119 Type 2 diabetes mellitus without complications: Secondary | ICD-10-CM | POA: Diagnosis not present

## 2017-06-27 DIAGNOSIS — E785 Hyperlipidemia, unspecified: Secondary | ICD-10-CM | POA: Diagnosis not present

## 2017-06-27 DIAGNOSIS — M6281 Muscle weakness (generalized): Secondary | ICD-10-CM | POA: Diagnosis not present

## 2017-06-27 DIAGNOSIS — I10 Essential (primary) hypertension: Secondary | ICD-10-CM | POA: Diagnosis not present

## 2017-06-27 DIAGNOSIS — E039 Hypothyroidism, unspecified: Secondary | ICD-10-CM | POA: Diagnosis not present

## 2017-06-29 DIAGNOSIS — I10 Essential (primary) hypertension: Secondary | ICD-10-CM | POA: Diagnosis not present

## 2017-06-29 DIAGNOSIS — K5900 Constipation, unspecified: Secondary | ICD-10-CM | POA: Diagnosis not present

## 2017-06-29 DIAGNOSIS — N183 Chronic kidney disease, stage 3 (moderate): Secondary | ICD-10-CM | POA: Diagnosis not present

## 2017-06-29 DIAGNOSIS — M4800 Spinal stenosis, site unspecified: Secondary | ICD-10-CM | POA: Diagnosis not present

## 2017-06-29 DIAGNOSIS — E039 Hypothyroidism, unspecified: Secondary | ICD-10-CM | POA: Diagnosis not present

## 2017-06-29 DIAGNOSIS — E119 Type 2 diabetes mellitus without complications: Secondary | ICD-10-CM | POA: Diagnosis not present

## 2017-06-29 DIAGNOSIS — R52 Pain, unspecified: Secondary | ICD-10-CM | POA: Diagnosis not present

## 2017-06-29 DIAGNOSIS — E785 Hyperlipidemia, unspecified: Secondary | ICD-10-CM | POA: Diagnosis not present

## 2017-06-29 DIAGNOSIS — M6281 Muscle weakness (generalized): Secondary | ICD-10-CM | POA: Diagnosis not present

## 2017-07-05 DIAGNOSIS — R110 Nausea: Secondary | ICD-10-CM | POA: Diagnosis not present

## 2017-07-05 DIAGNOSIS — R52 Pain, unspecified: Secondary | ICD-10-CM | POA: Diagnosis not present

## 2017-07-06 ENCOUNTER — Encounter: Payer: Self-pay | Admitting: *Deleted

## 2017-07-06 DIAGNOSIS — R52 Pain, unspecified: Secondary | ICD-10-CM | POA: Diagnosis not present

## 2017-07-06 DIAGNOSIS — E039 Hypothyroidism, unspecified: Secondary | ICD-10-CM | POA: Diagnosis not present

## 2017-07-06 DIAGNOSIS — E785 Hyperlipidemia, unspecified: Secondary | ICD-10-CM | POA: Diagnosis not present

## 2017-07-06 DIAGNOSIS — I10 Essential (primary) hypertension: Secondary | ICD-10-CM | POA: Diagnosis not present

## 2017-07-06 DIAGNOSIS — E119 Type 2 diabetes mellitus without complications: Secondary | ICD-10-CM | POA: Diagnosis not present

## 2017-07-06 DIAGNOSIS — K5900 Constipation, unspecified: Secondary | ICD-10-CM | POA: Diagnosis not present

## 2017-07-06 DIAGNOSIS — M4800 Spinal stenosis, site unspecified: Secondary | ICD-10-CM | POA: Diagnosis not present

## 2017-07-06 DIAGNOSIS — M6281 Muscle weakness (generalized): Secondary | ICD-10-CM | POA: Diagnosis not present

## 2017-07-06 NOTE — Patient Outreach (Signed)
[facility](THN) Care Management  07/06/2017  Silvio PateDoris B Spinner 02/21/1921 [id number]   Erroneous Encounter.  Danford BadJoanna Saporito, BSW, MSW, LCSW  Licensed Restaurant manager, fast foodClinical Social Worker  [facility] Care Management [place] [facility]  Mailing [place], [place], [place] [address] Physical Address-[address], [place], [place] [address] Toll Free Main # [phone] Fax # [phone] Cell # [phone]  Office # [phone] [email or web address]

## 2017-07-09 ENCOUNTER — Other Ambulatory Visit: Payer: Self-pay | Admitting: *Deleted

## 2017-07-09 ENCOUNTER — Encounter: Payer: Self-pay | Admitting: *Deleted

## 2017-07-09 NOTE — Patient Outreach (Signed)
[facility]) Care Management  07/09/2017  Lori Ellis 1921-04-15 [id number]   CSW was able to make contact with patient today to perform the initial assessment, as well as assess and assist with social work needs and services, when [place] met with patient at [facility] where patient currently resides to receive short-term rehabilitative services.  CSW introduced self, explained role and types of services provided through [place] Management ([place] [facility]).  CSW further explained to patient that CSW works with patient's [employer], also with [facility] Care Management. CSW then explained the reason for the visit, indicating that Utilization Management thought that patient would benefit from social work services and resources to assist with discharge planning needs and services from the skilled nursing facility.  CSW obtained two HIPAA compliant identifiers from patient, which included patient's name and date of birth. CSW sat and talked with patient's son, Lori Ellis, while waiting for patient to return from working with therapies (both physical and occupational).  Mr. Anastos voiced concerns about patient being able to return home to live alone at time of discharge from [facility], but reported that patient is "pretty adamant" about doing so.  In talking with patient, she reports, "I can manage".  Patient is able to ambulate with the assistance of her walker and reports being able to perform all activities of daily living independently.  Patient is agreeable to home health services being arranged on her behalf, closer to time of discharge.  Patient does not have a tentative discharge date scheduled at this time.  CSW agreed to follow-up with patient again in two weeks to assess and assist with discharge planning needs and services. [facility] CM Care Plan Problem One     Most Recent Value  Care Plan  Problem One  Level of care issues.  Role Documenting the Problem One  Clinical Social Worker  Care Plan for Problem One  Active  [facility] Long Term Goal   Patient will have home health services and durable medical equipment in place, within the next 45 days.  [facility] Long Term Goal Start Date  07/09/17  Interventions for Problem One Long Term Goal  CSW will assist patient and discharge planning coordinator at the skilled facility with arranging discharge planning needs and services for patient.  [facility] CM Short Term Goal #1   Patient will decide on a home health agency of choice, with regards to home care services, within the next three weeks.  [facility] CM Short Term Goal #1 Start Date  07/09/17  Interventions for Short Term Goal #1  CSW will provide patient with a list of home health agencies.  [facility] CM Short Term Goal #2   Patient will decide on an agency of choice, with regards to durable medical equipment, within the next three weeks.  [facility] CM Short Term Goal #2 Start Date  07/09/17  Interventions for Short Term Goal #2  CSW will provide patient with a list of agencies.    Lori Ellis, BSW, MSW, LCSW  Licensed Education officer, environmental Health System  Mailing [place] [address], [place], [place] [address] Physical Address-[address], [place], [place] [address] Toll Free Main # [phone] Fax # [phone] Cell # [phone]  Office # [phone] [email or web address]

## 2017-07-18 DIAGNOSIS — M6281 Muscle weakness (generalized): Secondary | ICD-10-CM | POA: Diagnosis not present

## 2017-07-18 DIAGNOSIS — K5900 Constipation, unspecified: Secondary | ICD-10-CM | POA: Diagnosis not present

## 2017-07-18 DIAGNOSIS — M4800 Spinal stenosis, site unspecified: Secondary | ICD-10-CM | POA: Diagnosis not present

## 2017-07-18 DIAGNOSIS — R52 Pain, unspecified: Secondary | ICD-10-CM | POA: Diagnosis not present

## 2017-07-18 DIAGNOSIS — I10 Essential (primary) hypertension: Secondary | ICD-10-CM | POA: Diagnosis not present

## 2017-07-18 DIAGNOSIS — E119 Type 2 diabetes mellitus without complications: Secondary | ICD-10-CM | POA: Diagnosis not present

## 2017-07-18 DIAGNOSIS — E039 Hypothyroidism, unspecified: Secondary | ICD-10-CM | POA: Diagnosis not present

## 2017-07-18 DIAGNOSIS — E785 Hyperlipidemia, unspecified: Secondary | ICD-10-CM | POA: Diagnosis not present

## 2017-07-23 ENCOUNTER — Other Ambulatory Visit: Payer: Self-pay | Admitting: *Deleted

## 2017-07-23 DIAGNOSIS — I4891 Unspecified atrial fibrillation: Secondary | ICD-10-CM | POA: Diagnosis not present

## 2017-07-23 DIAGNOSIS — N183 Chronic kidney disease, stage 3 (moderate): Secondary | ICD-10-CM | POA: Diagnosis not present

## 2017-07-23 DIAGNOSIS — R55 Syncope and collapse: Secondary | ICD-10-CM | POA: Diagnosis not present

## 2017-07-23 NOTE — Patient Outreach (Signed)
[facility]N) Care Management  07/23/2017  Lori PateDoris B Ellis 09/14/1920 [id number]  HTA referral; member [facility] 07/19/2017.  Per chart hx: Previous hospital stay with admission 12/25 with dxs-acute low back pain, lumbosacral spinal stenosis, advanced degenerative arthritis. Other dx: DM 2, stage 3 Renal Dx, Hypothyroidism, Compression deformity L4-L5.  Telephone call to patient who was advised of reason for call & [facility] care [facility] services.  Patient gave HIPPA verification.  Patient states she makes her own medical & health care decisions but discusses with her 2 sons. States they make all of her appointments & coordinate other services in relation to her health care & wellbeing.  States she has a caregiver who stays with her 24/7.  States her major health concern is pain in lower back & unstable legs. States she is currently using wheelchair and has hopes of using walker when she gets stronger. States she will have home health therapy but services haven't started yet.  States her sons have information & contact information & will them as needed.   Patient agreed to answer assessment questions. States she received discharge instructions which were reviewed at hospital./ States she is unable to read then now or review mediations because she has macular degeneration.   States her sons prepare her medications & caregiver gives her the medications & makes sure she is taking them as prescribed.    Patient voices that she does not need [facility] services at this time,  community or telephonic. States her primary care provider is Dr. Synetta Failaniel Jobe who provides medical care.  States health concerns are being provided for & coordinated by her sons. States she did not need contact information.   Plan: Send to care management coordinator for case closure.  Advise to have primary care provider added to Epic.  Colleen CanLinda Dariah Mcsorley, RN BSN  CCM Care Management Coordinator [facility] Care Management  [phone]

## 2017-07-24 ENCOUNTER — Other Ambulatory Visit: Payer: Self-pay | Admitting: *Deleted

## 2017-07-24 DIAGNOSIS — I4891 Unspecified atrial fibrillation: Secondary | ICD-10-CM | POA: Diagnosis not present

## 2017-07-24 DIAGNOSIS — R55 Syncope and collapse: Secondary | ICD-10-CM | POA: Diagnosis not present

## 2017-07-24 DIAGNOSIS — N183 Chronic kidney disease, stage 3 (moderate): Secondary | ICD-10-CM | POA: Diagnosis not present

## 2017-07-24 NOTE — Patient Outreach (Signed)
[facility]) Care Management  07/24/2017  SHELLYANN WANDREY 1920-11-20 [id number]   CSW covering for Nat Christen, LCSW, was advised patient was released from SNF and is home with HH/DME and support. Patient and family decline further [facility] services at this time.  CSW will close CSW consult and advise PCP, [facility] team and Nat Christen, LCSW,upon her return.   [facility] CM Care Plan Problem One     Most Recent Value  Care Plan Problem One  Level of care issues.  Role Documenting the Problem One  Clinical Social Worker  Care Plan for Problem One  Active  [facility] Long Term Goal   Patient will have home health services and durable medical equipment in place, within the next 45 days.  [facility] Long Term Goal Start Date  07/09/17  [facility] Long Term Goal Met Date  07/24/17  Interventions for Problem One Long Term Goal  CSW will assist patient and discharge planning coordinator at the skilled facility with arranging discharge planning needs and services for patient.  [facility] CM Short Term Goal #1   Patient will decide on a home health agency of choice, with regards to home care services, within the next three weeks.  [facility] CM Short Term Goal #1 Start Date  07/09/17  [facility] CM Short Term Goal #1 Met Date  07/24/17  Interventions for Short Term Goal #1  CSW will provide patient with a list of home health agencies.  [facility]N CM Short Term Goal #2   Patient will decide on an agency of choice, with regards to durable medical equipment, within the next three weeks.  [facility] CM Short Term Goal #2 Start Date  07/09/17  [facility] CM Short Term Goal #2 Met Date  07/24/17  Interventions for Short Term Goal #2  CSW will provide patient with a list of agencies.       Eduard Clos, MSW, [place] Worker  [place] [phone]

## 2017-07-27 DIAGNOSIS — S32000D Wedge compression fracture of unspecified lumbar vertebra, subsequent encounter for fracture with routine healing: Secondary | ICD-10-CM | POA: Diagnosis not present

## 2017-07-27 DIAGNOSIS — E1122 Type 2 diabetes mellitus with diabetic chronic kidney disease: Secondary | ICD-10-CM | POA: Diagnosis not present

## 2017-07-27 DIAGNOSIS — K5900 Constipation, unspecified: Secondary | ICD-10-CM | POA: Diagnosis not present

## 2017-07-27 DIAGNOSIS — I129 Hypertensive chronic kidney disease with stage 1 through stage 4 chronic kidney disease, or unspecified chronic kidney disease: Secondary | ICD-10-CM | POA: Diagnosis not present

## 2017-07-27 DIAGNOSIS — M48061 Spinal stenosis, lumbar region without neurogenic claudication: Secondary | ICD-10-CM | POA: Diagnosis not present

## 2017-07-27 DIAGNOSIS — N183 Chronic kidney disease, stage 3 (moderate): Secondary | ICD-10-CM | POA: Diagnosis not present

## 2017-07-27 DIAGNOSIS — D631 Anemia in chronic kidney disease: Secondary | ICD-10-CM | POA: Diagnosis not present

## 2017-07-27 DIAGNOSIS — Z9181 History of falling: Secondary | ICD-10-CM | POA: Diagnosis not present

## 2017-07-27 DIAGNOSIS — E039 Hypothyroidism, unspecified: Secondary | ICD-10-CM | POA: Diagnosis not present

## 2017-07-27 DIAGNOSIS — E785 Hyperlipidemia, unspecified: Secondary | ICD-10-CM | POA: Diagnosis not present

## 2017-07-27 DIAGNOSIS — M5136 Other intervertebral disc degeneration, lumbar region: Secondary | ICD-10-CM | POA: Diagnosis not present

## 2017-07-27 DIAGNOSIS — G894 Chronic pain syndrome: Secondary | ICD-10-CM | POA: Diagnosis not present

## 2017-08-01 DIAGNOSIS — E1122 Type 2 diabetes mellitus with diabetic chronic kidney disease: Secondary | ICD-10-CM | POA: Diagnosis not present

## 2017-08-01 DIAGNOSIS — I129 Hypertensive chronic kidney disease with stage 1 through stage 4 chronic kidney disease, or unspecified chronic kidney disease: Secondary | ICD-10-CM | POA: Diagnosis not present

## 2017-08-01 DIAGNOSIS — E039 Hypothyroidism, unspecified: Secondary | ICD-10-CM | POA: Diagnosis not present

## 2017-08-01 DIAGNOSIS — N183 Chronic kidney disease, stage 3 (moderate): Secondary | ICD-10-CM | POA: Diagnosis not present

## 2017-08-16 DIAGNOSIS — M5416 Radiculopathy, lumbar region: Secondary | ICD-10-CM | POA: Diagnosis not present

## 2017-08-20 DIAGNOSIS — N189 Chronic kidney disease, unspecified: Secondary | ICD-10-CM | POA: Diagnosis not present

## 2017-08-28 DIAGNOSIS — E876 Hypokalemia: Secondary | ICD-10-CM | POA: Diagnosis not present

## 2017-09-16 DIAGNOSIS — M5416 Radiculopathy, lumbar region: Secondary | ICD-10-CM | POA: Diagnosis not present

## 2017-10-16 DIAGNOSIS — M5416 Radiculopathy, lumbar region: Secondary | ICD-10-CM | POA: Diagnosis not present

## 2017-10-29 DIAGNOSIS — M5136 Other intervertebral disc degeneration, lumbar region: Secondary | ICD-10-CM | POA: Diagnosis not present

## 2017-10-29 DIAGNOSIS — Z79891 Long term (current) use of opiate analgesic: Secondary | ICD-10-CM | POA: Diagnosis not present

## 2017-10-29 DIAGNOSIS — G8929 Other chronic pain: Secondary | ICD-10-CM | POA: Diagnosis not present

## 2017-10-29 DIAGNOSIS — M545 Low back pain: Secondary | ICD-10-CM | POA: Diagnosis not present

## 2017-10-30 DIAGNOSIS — E1122 Type 2 diabetes mellitus with diabetic chronic kidney disease: Secondary | ICD-10-CM | POA: Diagnosis not present

## 2017-10-30 DIAGNOSIS — N183 Chronic kidney disease, stage 3 (moderate): Secondary | ICD-10-CM | POA: Diagnosis not present

## 2017-10-30 DIAGNOSIS — I129 Hypertensive chronic kidney disease with stage 1 through stage 4 chronic kidney disease, or unspecified chronic kidney disease: Secondary | ICD-10-CM | POA: Diagnosis not present

## 2017-11-15 DIAGNOSIS — M5416 Radiculopathy, lumbar region: Secondary | ICD-10-CM | POA: Diagnosis not present

## 2017-11-15 DIAGNOSIS — M533 Sacrococcygeal disorders, not elsewhere classified: Secondary | ICD-10-CM | POA: Diagnosis not present

## 2017-11-15 DIAGNOSIS — M5136 Other intervertebral disc degeneration, lumbar region: Secondary | ICD-10-CM | POA: Diagnosis not present

## 2017-11-16 DIAGNOSIS — M5416 Radiculopathy, lumbar region: Secondary | ICD-10-CM | POA: Diagnosis not present

## 2017-12-16 DIAGNOSIS — M5416 Radiculopathy, lumbar region: Secondary | ICD-10-CM | POA: Diagnosis not present

## 2018-01-16 DIAGNOSIS — M5416 Radiculopathy, lumbar region: Secondary | ICD-10-CM | POA: Diagnosis not present

## 2018-01-31 DIAGNOSIS — Z7189 Other specified counseling: Secondary | ICD-10-CM | POA: Diagnosis not present

## 2018-01-31 DIAGNOSIS — E039 Hypothyroidism, unspecified: Secondary | ICD-10-CM | POA: Diagnosis not present

## 2018-01-31 DIAGNOSIS — N183 Chronic kidney disease, stage 3 (moderate): Secondary | ICD-10-CM | POA: Diagnosis not present

## 2018-01-31 DIAGNOSIS — E1169 Type 2 diabetes mellitus with other specified complication: Secondary | ICD-10-CM | POA: Diagnosis not present

## 2018-01-31 DIAGNOSIS — E1122 Type 2 diabetes mellitus with diabetic chronic kidney disease: Secondary | ICD-10-CM | POA: Diagnosis not present

## 2018-01-31 DIAGNOSIS — I129 Hypertensive chronic kidney disease with stage 1 through stage 4 chronic kidney disease, or unspecified chronic kidney disease: Secondary | ICD-10-CM | POA: Diagnosis not present

## 2018-01-31 DIAGNOSIS — E785 Hyperlipidemia, unspecified: Secondary | ICD-10-CM | POA: Diagnosis not present

## 2018-01-31 DIAGNOSIS — H3530 Unspecified macular degeneration: Secondary | ICD-10-CM | POA: Diagnosis not present

## 2018-01-31 DIAGNOSIS — E1159 Type 2 diabetes mellitus with other circulatory complications: Secondary | ICD-10-CM | POA: Diagnosis not present

## 2018-02-16 DIAGNOSIS — M5416 Radiculopathy, lumbar region: Secondary | ICD-10-CM | POA: Diagnosis not present

## 2018-03-18 DIAGNOSIS — M5416 Radiculopathy, lumbar region: Secondary | ICD-10-CM | POA: Diagnosis not present

## 2018-04-18 DIAGNOSIS — M5416 Radiculopathy, lumbar region: Secondary | ICD-10-CM | POA: Diagnosis not present

## 2018-05-18 DIAGNOSIS — M5416 Radiculopathy, lumbar region: Secondary | ICD-10-CM | POA: Diagnosis not present

## 2018-06-03 DIAGNOSIS — E1159 Type 2 diabetes mellitus with other circulatory complications: Secondary | ICD-10-CM | POA: Diagnosis not present

## 2018-06-03 DIAGNOSIS — G8929 Other chronic pain: Secondary | ICD-10-CM | POA: Diagnosis not present

## 2018-06-03 DIAGNOSIS — E1122 Type 2 diabetes mellitus with diabetic chronic kidney disease: Secondary | ICD-10-CM | POA: Diagnosis not present

## 2018-06-03 DIAGNOSIS — M5441 Lumbago with sciatica, right side: Secondary | ICD-10-CM | POA: Diagnosis not present

## 2018-06-03 DIAGNOSIS — I152 Hypertension secondary to endocrine disorders: Secondary | ICD-10-CM | POA: Diagnosis not present

## 2018-06-03 DIAGNOSIS — M5442 Lumbago with sciatica, left side: Secondary | ICD-10-CM | POA: Diagnosis not present

## 2018-06-03 DIAGNOSIS — E039 Hypothyroidism, unspecified: Secondary | ICD-10-CM | POA: Diagnosis not present

## 2018-06-03 DIAGNOSIS — M5432 Sciatica, left side: Secondary | ICD-10-CM | POA: Diagnosis not present

## 2018-06-03 DIAGNOSIS — N183 Chronic kidney disease, stage 3 (moderate): Secondary | ICD-10-CM | POA: Diagnosis not present

## 2018-06-03 DIAGNOSIS — Z23 Encounter for immunization: Secondary | ICD-10-CM | POA: Diagnosis not present

## 2018-06-03 DIAGNOSIS — E538 Deficiency of other specified B group vitamins: Secondary | ICD-10-CM | POA: Diagnosis not present

## 2018-06-03 DIAGNOSIS — E559 Vitamin D deficiency, unspecified: Secondary | ICD-10-CM | POA: Diagnosis not present

## 2018-09-02 DIAGNOSIS — M5441 Lumbago with sciatica, right side: Secondary | ICD-10-CM | POA: Diagnosis not present

## 2018-09-02 DIAGNOSIS — D509 Iron deficiency anemia, unspecified: Secondary | ICD-10-CM | POA: Diagnosis not present

## 2018-09-02 DIAGNOSIS — I10 Essential (primary) hypertension: Secondary | ICD-10-CM | POA: Diagnosis not present

## 2018-09-02 DIAGNOSIS — E039 Hypothyroidism, unspecified: Secondary | ICD-10-CM | POA: Diagnosis not present

## 2018-09-02 DIAGNOSIS — M5442 Lumbago with sciatica, left side: Secondary | ICD-10-CM | POA: Diagnosis not present

## 2018-09-02 DIAGNOSIS — E1159 Type 2 diabetes mellitus with other circulatory complications: Secondary | ICD-10-CM | POA: Diagnosis not present

## 2018-09-02 DIAGNOSIS — E538 Deficiency of other specified B group vitamins: Secondary | ICD-10-CM | POA: Diagnosis not present

## 2018-09-02 DIAGNOSIS — G8929 Other chronic pain: Secondary | ICD-10-CM | POA: Diagnosis not present

## 2018-12-12 DIAGNOSIS — I129 Hypertensive chronic kidney disease with stage 1 through stage 4 chronic kidney disease, or unspecified chronic kidney disease: Secondary | ICD-10-CM | POA: Diagnosis not present

## 2018-12-12 DIAGNOSIS — G8929 Other chronic pain: Secondary | ICD-10-CM | POA: Diagnosis not present

## 2018-12-12 DIAGNOSIS — M5441 Lumbago with sciatica, right side: Secondary | ICD-10-CM | POA: Diagnosis not present

## 2018-12-12 DIAGNOSIS — E1122 Type 2 diabetes mellitus with diabetic chronic kidney disease: Secondary | ICD-10-CM | POA: Diagnosis not present

## 2018-12-12 DIAGNOSIS — M5442 Lumbago with sciatica, left side: Secondary | ICD-10-CM | POA: Diagnosis not present

## 2018-12-12 DIAGNOSIS — N183 Chronic kidney disease, stage 3 (moderate): Secondary | ICD-10-CM | POA: Diagnosis not present

## 2018-12-12 DIAGNOSIS — E1159 Type 2 diabetes mellitus with other circulatory complications: Secondary | ICD-10-CM | POA: Diagnosis not present

## 2018-12-12 DIAGNOSIS — H6123 Impacted cerumen, bilateral: Secondary | ICD-10-CM | POA: Diagnosis not present

## 2019-01-01 ENCOUNTER — Encounter (HOSPITAL_COMMUNITY): Payer: Self-pay | Admitting: Emergency Medicine

## 2019-01-01 ENCOUNTER — Inpatient Hospital Stay (HOSPITAL_COMMUNITY): Payer: PPO

## 2019-01-01 ENCOUNTER — Other Ambulatory Visit: Payer: Self-pay

## 2019-01-01 ENCOUNTER — Emergency Department (HOSPITAL_COMMUNITY): Payer: PPO

## 2019-01-01 ENCOUNTER — Inpatient Hospital Stay (HOSPITAL_COMMUNITY)
Admission: EM | Admit: 2019-01-01 | Discharge: 2019-01-10 | DRG: 481 | Disposition: A | Discharge status: Skilled Nursing Facility | Payer: PPO | Attending: Internal Medicine | Admitting: Internal Medicine

## 2019-01-01 DIAGNOSIS — N179 Acute kidney failure, unspecified: Secondary | ICD-10-CM | POA: Diagnosis present

## 2019-01-01 DIAGNOSIS — E1129 Type 2 diabetes mellitus with other diabetic kidney complication: Secondary | ICD-10-CM | POA: Diagnosis not present

## 2019-01-01 DIAGNOSIS — S0101XA Laceration without foreign body of scalp, initial encounter: Secondary | ICD-10-CM | POA: Diagnosis present

## 2019-01-01 DIAGNOSIS — S72141D Displaced intertrochanteric fracture of right femur, subsequent encounter for closed fracture with routine healing: Secondary | ICD-10-CM | POA: Diagnosis not present

## 2019-01-01 DIAGNOSIS — W1830XA Fall on same level, unspecified, initial encounter: Secondary | ICD-10-CM | POA: Diagnosis present

## 2019-01-01 DIAGNOSIS — N183 Chronic kidney disease, stage 3 unspecified: Secondary | ICD-10-CM | POA: Diagnosis present

## 2019-01-01 DIAGNOSIS — K219 Gastro-esophageal reflux disease without esophagitis: Secondary | ICD-10-CM | POA: Diagnosis not present

## 2019-01-01 DIAGNOSIS — Z419 Encounter for procedure for purposes other than remedying health state, unspecified: Secondary | ICD-10-CM

## 2019-01-01 DIAGNOSIS — Z20828 Contact with and (suspected) exposure to other viral communicable diseases: Secondary | ICD-10-CM | POA: Diagnosis present

## 2019-01-01 DIAGNOSIS — D62 Acute posthemorrhagic anemia: Secondary | ICD-10-CM | POA: Diagnosis not present

## 2019-01-01 DIAGNOSIS — Z79891 Long term (current) use of opiate analgesic: Secondary | ICD-10-CM | POA: Diagnosis not present

## 2019-01-01 DIAGNOSIS — S72141A Displaced intertrochanteric fracture of right femur, initial encounter for closed fracture: Principal | ICD-10-CM | POA: Diagnosis present

## 2019-01-01 DIAGNOSIS — I10 Essential (primary) hypertension: Secondary | ICD-10-CM | POA: Diagnosis not present

## 2019-01-01 DIAGNOSIS — E1122 Type 2 diabetes mellitus with diabetic chronic kidney disease: Secondary | ICD-10-CM | POA: Diagnosis not present

## 2019-01-01 DIAGNOSIS — I4892 Unspecified atrial flutter: Secondary | ICD-10-CM | POA: Diagnosis not present

## 2019-01-01 DIAGNOSIS — Z781 Physical restraint status: Secondary | ICD-10-CM | POA: Diagnosis not present

## 2019-01-01 DIAGNOSIS — Z993 Dependence on wheelchair: Secondary | ICD-10-CM | POA: Diagnosis not present

## 2019-01-01 DIAGNOSIS — I129 Hypertensive chronic kidney disease with stage 1 through stage 4 chronic kidney disease, or unspecified chronic kidney disease: Secondary | ICD-10-CM | POA: Diagnosis present

## 2019-01-01 DIAGNOSIS — G9340 Encephalopathy, unspecified: Secondary | ICD-10-CM | POA: Diagnosis not present

## 2019-01-01 DIAGNOSIS — S72144D Nondisplaced intertrochanteric fracture of right femur, subsequent encounter for closed fracture with routine healing: Secondary | ICD-10-CM | POA: Diagnosis not present

## 2019-01-01 DIAGNOSIS — N390 Urinary tract infection, site not specified: Secondary | ICD-10-CM | POA: Diagnosis present

## 2019-01-01 DIAGNOSIS — M2550 Pain in unspecified joint: Secondary | ICD-10-CM | POA: Diagnosis not present

## 2019-01-01 DIAGNOSIS — Z79899 Other long term (current) drug therapy: Secondary | ICD-10-CM

## 2019-01-01 DIAGNOSIS — F0390 Unspecified dementia without behavioral disturbance: Secondary | ICD-10-CM | POA: Diagnosis present

## 2019-01-01 DIAGNOSIS — M25572 Pain in left ankle and joints of left foot: Secondary | ICD-10-CM | POA: Diagnosis not present

## 2019-01-01 DIAGNOSIS — M1711 Unilateral primary osteoarthritis, right knee: Secondary | ICD-10-CM | POA: Diagnosis not present

## 2019-01-01 DIAGNOSIS — I959 Hypotension, unspecified: Secondary | ICD-10-CM | POA: Diagnosis not present

## 2019-01-01 DIAGNOSIS — H3530 Unspecified macular degeneration: Secondary | ICD-10-CM | POA: Diagnosis not present

## 2019-01-01 DIAGNOSIS — M810 Age-related osteoporosis without current pathological fracture: Secondary | ICD-10-CM | POA: Diagnosis not present

## 2019-01-01 DIAGNOSIS — Z66 Do not resuscitate: Secondary | ICD-10-CM | POA: Diagnosis present

## 2019-01-01 DIAGNOSIS — E039 Hypothyroidism, unspecified: Secondary | ICD-10-CM | POA: Diagnosis present

## 2019-01-01 DIAGNOSIS — Z01818 Encounter for other preprocedural examination: Secondary | ICD-10-CM

## 2019-01-01 DIAGNOSIS — E119 Type 2 diabetes mellitus without complications: Secondary | ICD-10-CM | POA: Diagnosis not present

## 2019-01-01 DIAGNOSIS — S72144A Nondisplaced intertrochanteric fracture of right femur, initial encounter for closed fracture: Secondary | ICD-10-CM | POA: Diagnosis not present

## 2019-01-01 DIAGNOSIS — Z79890 Hormone replacement therapy (postmenopausal): Secondary | ICD-10-CM

## 2019-01-01 DIAGNOSIS — M5136 Other intervertebral disc degeneration, lumbar region: Secondary | ICD-10-CM | POA: Diagnosis present

## 2019-01-01 DIAGNOSIS — I517 Cardiomegaly: Secondary | ICD-10-CM | POA: Diagnosis not present

## 2019-01-01 DIAGNOSIS — W19XXXD Unspecified fall, subsequent encounter: Secondary | ICD-10-CM | POA: Diagnosis not present

## 2019-01-01 DIAGNOSIS — K5900 Constipation, unspecified: Secondary | ICD-10-CM | POA: Diagnosis not present

## 2019-01-01 DIAGNOSIS — D638 Anemia in other chronic diseases classified elsewhere: Secondary | ICD-10-CM | POA: Diagnosis not present

## 2019-01-01 DIAGNOSIS — Z7401 Bed confinement status: Secondary | ICD-10-CM | POA: Diagnosis not present

## 2019-01-01 DIAGNOSIS — M6281 Muscle weakness (generalized): Secondary | ICD-10-CM | POA: Diagnosis not present

## 2019-01-01 DIAGNOSIS — W19XXXA Unspecified fall, initial encounter: Secondary | ICD-10-CM | POA: Diagnosis not present

## 2019-01-01 DIAGNOSIS — Z885 Allergy status to narcotic agent status: Secondary | ICD-10-CM

## 2019-01-01 DIAGNOSIS — S72009A Fracture of unspecified part of neck of unspecified femur, initial encounter for closed fracture: Secondary | ICD-10-CM

## 2019-01-01 DIAGNOSIS — M25551 Pain in right hip: Secondary | ICD-10-CM | POA: Diagnosis not present

## 2019-01-01 DIAGNOSIS — R5383 Other fatigue: Secondary | ICD-10-CM | POA: Clinically undetermined

## 2019-01-01 DIAGNOSIS — R52 Pain, unspecified: Secondary | ICD-10-CM | POA: Diagnosis not present

## 2019-01-01 DIAGNOSIS — Z03818 Encounter for observation for suspected exposure to other biological agents ruled out: Secondary | ICD-10-CM | POA: Diagnosis not present

## 2019-01-01 DIAGNOSIS — S72121A Displaced fracture of lesser trochanter of right femur, initial encounter for closed fracture: Secondary | ICD-10-CM | POA: Diagnosis not present

## 2019-01-01 DIAGNOSIS — S199XXA Unspecified injury of neck, initial encounter: Secondary | ICD-10-CM | POA: Diagnosis not present

## 2019-01-01 DIAGNOSIS — S0990XA Unspecified injury of head, initial encounter: Secondary | ICD-10-CM | POA: Diagnosis not present

## 2019-01-01 DIAGNOSIS — R4182 Altered mental status, unspecified: Secondary | ICD-10-CM | POA: Diagnosis not present

## 2019-01-01 DIAGNOSIS — I700 Atherosclerosis of aorta: Secondary | ICD-10-CM | POA: Diagnosis not present

## 2019-01-01 DIAGNOSIS — Z880 Allergy status to penicillin: Secondary | ICD-10-CM | POA: Diagnosis not present

## 2019-01-01 DIAGNOSIS — M25761 Osteophyte, right knee: Secondary | ICD-10-CM | POA: Diagnosis not present

## 2019-01-01 DIAGNOSIS — M4807 Spinal stenosis, lumbosacral region: Secondary | ICD-10-CM | POA: Diagnosis not present

## 2019-01-01 LAB — CBC WITH DIFFERENTIAL/PLATELET
Abs Immature Granulocytes: 0.04 10*3/uL (ref 0.00–0.07)
Basophils Absolute: 0.1 10*3/uL (ref 0.0–0.1)
Basophils Relative: 1 %
Eosinophils Absolute: 0.3 10*3/uL (ref 0.0–0.5)
Eosinophils Relative: 3 %
HCT: 38.9 % (ref 36.0–46.0)
Hemoglobin: 12.3 g/dL (ref 12.0–15.0)
Immature Granulocytes: 0 %
Lymphocytes Relative: 23 %
Lymphs Abs: 2.1 10*3/uL (ref 0.7–4.0)
MCH: 32 pg (ref 26.0–34.0)
MCHC: 31.6 g/dL (ref 30.0–36.0)
MCV: 101.3 fL — ABNORMAL HIGH (ref 80.0–100.0)
Monocytes Absolute: 0.8 10*3/uL (ref 0.1–1.0)
Monocytes Relative: 9 %
Neutro Abs: 5.7 10*3/uL (ref 1.7–7.7)
Neutrophils Relative %: 64 %
Platelets: 191 10*3/uL (ref 150–400)
RBC: 3.84 MIL/uL — ABNORMAL LOW (ref 3.87–5.11)
RDW: 12.1 % (ref 11.5–15.5)
WBC: 9 10*3/uL (ref 4.0–10.5)
nRBC: 0 % (ref 0.0–0.2)

## 2019-01-01 LAB — PROTIME-INR
INR: 1 (ref 0.8–1.2)
Prothrombin Time: 12.6 seconds (ref 11.4–15.2)

## 2019-01-01 LAB — TYPE AND SCREEN
ABO/RH(D): B POS
Antibody Screen: NEGATIVE

## 2019-01-01 LAB — CBG MONITORING, ED: Glucose-Capillary: 125 mg/dL — ABNORMAL HIGH (ref 70–99)

## 2019-01-01 LAB — ABO/RH: ABO/RH(D): B POS

## 2019-01-01 LAB — BASIC METABOLIC PANEL
Anion gap: 9 (ref 5–15)
BUN: 14 mg/dL (ref 8–23)
CO2: 29 mmol/L (ref 22–32)
Calcium: 9.1 mg/dL (ref 8.9–10.3)
Chloride: 103 mmol/L (ref 98–111)
Creatinine, Ser: 1.06 mg/dL — ABNORMAL HIGH (ref 0.44–1.00)
GFR calc Af Amer: 52 mL/min — ABNORMAL LOW (ref >60)
GFR calc non Af Amer: 45 mL/min — ABNORMAL LOW (ref >60)
Glucose, Bld: 166 mg/dL — ABNORMAL HIGH (ref 70–99)
Potassium: 4 mmol/L (ref 3.5–5.1)
Sodium: 141 mmol/L (ref 135–145)

## 2019-01-01 LAB — SARS CORONAVIRUS 2 BY RT PCR (HOSPITAL ORDER, PERFORMED IN ~~LOC~~ HOSPITAL LAB): SARS Coronavirus 2: NEGATIVE

## 2019-01-01 LAB — SURGICAL PCR SCREEN
MRSA, PCR: NEGATIVE
Staphylococcus aureus: POSITIVE — AB

## 2019-01-01 LAB — HEMOGLOBIN A1C
Hgb A1c MFr Bld: 5.9 % — ABNORMAL HIGH (ref 4.8–5.6)
Mean Plasma Glucose: 122.63 mg/dL

## 2019-01-01 MED ORDER — GABAPENTIN 300 MG PO CAPS
300.0000 mg | ORAL_CAPSULE | Freq: Every day | ORAL | Status: DC
  Administered 2019-01-01 – 2019-01-09 (×8): 300 mg via ORAL
  Filled 2019-01-01 (×9): qty 1

## 2019-01-01 MED ORDER — MORPHINE SULFATE (PF) 2 MG/ML IV SOLN
0.5000 mg | INTRAVENOUS | Status: DC | PRN
  Administered 2019-01-01: 22:00:00 0.5 mg via INTRAVENOUS
  Filled 2019-01-01: qty 1

## 2019-01-01 MED ORDER — PROPRANOLOL HCL 20 MG PO TABS
40.0000 mg | ORAL_TABLET | Freq: Every day | ORAL | Status: DC
  Administered 2019-01-02 – 2019-01-10 (×8): 40 mg via ORAL
  Filled 2019-01-01 (×8): qty 2

## 2019-01-01 MED ORDER — PROPRANOLOL HCL 20 MG PO TABS: 20.0000 mg | ORAL_TABLET | ORAL | Status: DC

## 2019-01-01 MED ORDER — FENTANYL CITRATE (PF) 100 MCG/2ML IJ SOLN: 50.0000 ug | INTRAMUSCULAR | Status: DC | PRN

## 2019-01-01 MED ORDER — LISINOPRIL 10 MG PO TABS: 10.0000 mg | ORAL_TABLET | Freq: Every day | ORAL | Status: DC

## 2019-01-01 MED ORDER — INSULIN ASPART 100 UNIT/ML ~~LOC~~ SOLN
0.0000 [IU] | Freq: Three times a day (TID) | SUBCUTANEOUS | Status: DC
  Administered 2019-01-02: 2 [IU] via SUBCUTANEOUS
  Administered 2019-01-02: 3 [IU] via SUBCUTANEOUS
  Administered 2019-01-03 – 2019-01-05 (×6): 2 [IU] via SUBCUTANEOUS
  Administered 2019-01-05: 3 [IU] via SUBCUTANEOUS
  Administered 2019-01-05 – 2019-01-06 (×4): 2 [IU] via SUBCUTANEOUS
  Administered 2019-01-07: 3 [IU] via SUBCUTANEOUS
  Administered 2019-01-07 – 2019-01-08 (×2): 5 [IU] via SUBCUTANEOUS
  Administered 2019-01-09 – 2019-01-10 (×4): 2 [IU] via SUBCUTANEOUS
  Administered 2019-01-10: 12:00:00 3 [IU] via SUBCUTANEOUS

## 2019-01-01 MED ORDER — PROPRANOLOL HCL 20 MG PO TABS
20.0000 mg | ORAL_TABLET | ORAL | Status: DC
  Administered 2019-01-02 – 2019-01-09 (×7): 20 mg via ORAL
  Filled 2019-01-01 (×7): qty 1

## 2019-01-01 MED ORDER — AMLODIPINE BESYLATE 2.5 MG PO TABS
2.5000 mg | ORAL_TABLET | Freq: Every day | ORAL | Status: DC
  Administered 2019-01-01 – 2019-01-09 (×8): 2.5 mg via ORAL
  Filled 2019-01-01 (×9): qty 1

## 2019-01-01 MED ORDER — HYDROCODONE-ACETAMINOPHEN 5-325 MG PO TABS: 1.0000 | ORAL_TABLET | Freq: Four times a day (QID) | ORAL | Status: DC | PRN

## 2019-01-01 MED ORDER — SODIUM CHLORIDE 0.45 % IV SOLN
INTRAVENOUS | Status: AC
  Administered 2019-01-01: 22:00:00 via INTRAVENOUS

## 2019-01-01 MED ORDER — LISINOPRIL 20 MG PO TABS
20.0000 mg | ORAL_TABLET | Freq: Every morning | ORAL | Status: DC
  Administered 2019-01-03 – 2019-01-05 (×2): 20 mg via ORAL
  Filled 2019-01-01 (×2): qty 1

## 2019-01-01 MED ORDER — SODIUM CHLORIDE 0.9 % IV SOLN: INTRAVENOUS | Status: DC

## 2019-01-01 MED ORDER — ONDANSETRON HCL 4 MG/2ML IJ SOLN: 4.0000 mg | Freq: Once | INTRAMUSCULAR | Status: DC

## 2019-01-01 MED ORDER — LEVOTHYROXINE SODIUM 75 MCG PO TABS
75.0000 ug | ORAL_TABLET | Freq: Every day | ORAL | Status: DC
  Administered 2019-01-03 – 2019-01-10 (×6): 75 ug via ORAL
  Filled 2019-01-01 (×6): qty 1

## 2019-01-01 MED ORDER — SENNA 8.6 MG PO TABS
1.0000 | ORAL_TABLET | Freq: Two times a day (BID) | ORAL | Status: DC
  Administered 2019-01-02 – 2019-01-10 (×13): 8.6 mg via ORAL
  Filled 2019-01-01 (×15): qty 1

## 2019-01-01 MED ORDER — ENOXAPARIN SODIUM 30 MG/0.3ML ~~LOC~~ SOLN: 30.0000 mg | SUBCUTANEOUS | Status: DC

## 2019-01-01 NOTE — ED Notes (Signed)
Unable to locate her sons number was told at shift change that he was in waitiong room  Not there no number listed in chaRT

## 2019-01-01 NOTE — ED Notes (Signed)
Pt very angry  She reports that she wants to go home will not listen to any explanation about the wait  Needs her head sutured still oozing blood  Asking for her son

## 2019-01-01 NOTE — ED Provider Notes (Signed)
[facility] EMERGENCY DEPARTMENT Provider Note   CSN: [id number] Arrival date & time: 01/01/19  1425    History   Chief Complaint Chief Complaint  Patient presents with  . Fall  . Hip Pain  . Head Injury    HPI Lori Ellis is a 85 y.o. female.     HPI  The patient is a 85 year old female, no history of prior bony fractures however she walks with a walker and when she was trying to put a item in her refrigerator freezer she lost her balance and fell backwards striking her head on the right temporal parietal scalp and falling to the ground on her right hip which cause acute onset of pain deformity and the inability to get back up.  She denies a loss of consciousness, she denies neck pain or back pain, the pain is mostly in her right hip.  The paramedics found the patient to be shortened and externally rotated on the right side, they placed her in a hip pelvic binder in a cervical collar however on arrival the patient had remove the cervical collar.  The symptoms are persistent, worse with range of motion of the right leg, not associated with numbness weakness or vomiting.  No seizure activity prehospital.  The patient is not on anticoagulants according to the medical record which I reviewed at length.  Past Medical History:  Diagnosis Date  . Degenerative disc disease, lumbar   . Diabetes mellitus without complication (HCC)   . Hypertension     Patient Active Problem List   Diagnosis Date Noted  . Benign essential HTN   . Spinal stenosis, lumbar 06/12/2017  . Chronic bilateral low back pain with bilateral sciatica 03/07/2016  . Spondylolisthesis of lumbar region 03/07/2016  . CKD (chronic kidney disease) stage 3, GFR 30-59 ml/min (HCC) 03/12/2014  . Hypothyroidism 09/02/2013  . Type II diabetes mellitus with renal manifestations (HCC) 09/02/2013  . DDD (degenerative disc disease), lumbosacral 09/01/2013    Past Surgical History:  Procedure Laterality  Date  . BLADDER SURGERY    . CHOLECYSTECTOMY       OB History   No obstetric history on file.      Home Medications    Prior to Admission medications   Medication Sig Start Date End Date Taking? Authorizing Provider  acetaminophen (TYLENOL) 325 MG tablet Take 2 tablets (650 mg total) by mouth every 6 (six) hours as needed for mild pain. 06/21/17   Santos-Sanchez, Chelsea Primus, MD  Amino Acids-Protein Hydrolys (FEEDING SUPPLEMENT, PRO-STAT SUGAR FREE 64,) LIQD Take 30 mLs by mouth daily at 3 pm. 06/21/17   Burna Cash, MD  atorvastatin (LIPITOR) 40 MG tablet Take 40 mg by mouth daily.    [provider]  docusate sodium (COLACE) 100 MG capsule Take 1 capsule (100 mg total) by mouth every 12 (twelve) hours. 06/12/17   Ward, Layla Maw, DO  gabapentin (NEURONTIN) 300 MG capsule Take 300 mg by mouth at bedtime.    [provider]  ibuprofen (ADVIL,MOTRIN) 400 MG tablet Take 1 tablet (400 mg total) by mouth 4 (four) times daily. 06/21/17   Burna Cash, MD  levothyroxine (SYNTHROID, LEVOTHROID) 100 MCG tablet Take 100 mcg by mouth daily before breakfast.    [provider]  lisinopril (PRINIVIL,ZESTRIL) 10 MG tablet Take 1 tablet (10 mg total) by mouth daily. 06/21/17   Santos-Sanchez, Chelsea Primus, MD  ondansetron (ZOFRAN ODT) 4 MG disintegrating tablet Take 1 tablet (4 mg total) by mouth every  6 (six) hours as needed for nausea or vomiting. 06/12/17   Ward, Layla MawKristen N, DO  polyethylene glycol (MIRALAX / GLYCOLAX) packet Take 17 g by mouth daily. 06/22/17   Santos-Sanchez, Chelsea PrimusIdalys, MD  propranolol (INDERAL) 20 MG tablet Take 20-40 mg by mouth 2 (two) times daily. Two tablets in the morning and 1 tablet in the evening    [provider]  senna-docusate (SENOKOT-S) 8.6-50 MG tablet Take 1 tablet by mouth 2 (two) times daily. 06/21/17   Santos-Sanchez, Chelsea PrimusIdalys, MD  traMADol (ULTRAM) 50 MG tablet Take 1 tablet (50 mg total) by mouth every 6 (six) hours as needed for  moderate pain or severe pain. 06/21/17   Burna CashSantos-Sanchez, Idalys, MD    Family History History reviewed. No pertinent family history.  Social History Social History   Tobacco Use  . Smoking status: Never Smoker  . Smokeless tobacco: Never Used  Substance Use Topics  . Alcohol use: No    Frequency: Never  . Drug use: Not on file     Allergies   Fentanyl, Penicillins, Tetanus toxoid, Adhesive [tape], Advil [ibuprofen], Hydrochlorothiazide, and Naproxen sodium   Review of Systems Review of Systems  All other systems reviewed and are negative.    Physical Exam Updated Vital Signs BP (!) 190/70 (BP Location: Right Arm)   Pulse 65   Temp 98.5 F (36.9 C) (Oral)   Resp 18   Ht 1.6 m (5\' 3" )   Wt 54.4 kg   SpO2 100%   BMI 21.26 kg/m   Physical Exam Vitals signs and nursing note reviewed.  Constitutional:      General: She is not in acute distress.    Appearance: She is well-developed.  HENT:     Head: Normocephalic.     Comments: Laceration to the right scalp    Mouth/Throat:     Pharynx: No oropharyngeal exudate.  Eyes:     General: No scleral icterus.       Right eye: No discharge.        Left eye: No discharge.     Conjunctiva/sclera: Conjunctivae normal.     Pupils: Pupils are equal, round, and reactive to light.  Neck:     Musculoskeletal: Normal range of motion and neck supple.     Thyroid: No thyromegaly.     Vascular: No JVD.     Comments: No posterior cervical tenderness Cardiovascular:     Rate and Rhythm: Normal rate and regular rhythm.     Heart sounds: Normal heart sounds. No murmur. No friction rub. No gallop.   Pulmonary:     Effort: Pulmonary effort is normal. No respiratory distress.     Breath sounds: Normal breath sounds. No wheezing or rales.  Abdominal:     General: Bowel sounds are normal. There is no distension.     Palpations: Abdomen is soft. There is no mass.     Tenderness: There is no abdominal tenderness.  Musculoskeletal:  Normal range of motion.        General: Tenderness and deformity present.     Comments: Right lower extremity is shortened and externally rotated, severe pain with any range of motion of the hip.  Lymphadenopathy:     Cervical: No cervical adenopathy.  Skin:    General: Skin is warm and dry.     Findings: No erythema or rash.     Comments: Laceration to scalp  Neurological:     Mental Status: She is alert.     Coordination:  Coordination normal.     Comments: The patient is able to follow commands without difficulty except for her right leg which has too much pain to do any movement at the hip.  She can move her foot normally  Psychiatric:        Behavior: Behavior normal.      ED Treatments / Results  Labs (all labs ordered are listed, but only abnormal results are displayed) Labs Reviewed  BASIC METABOLIC PANEL - Abnormal; Notable for the following components:      Result Value   Glucose, Bld 166 (*)    Creatinine, Ser 1.06 (*)    GFR calc non Af Amer 45 (*)    GFR calc Af Amer 52 (*)    All other components within normal limits  CBC WITH DIFFERENTIAL/PLATELET - Abnormal; Notable for the following components:   RBC 3.84 (*)    MCV 101.3 (*)    All other components within normal limits  PROTIME-INR  TYPE AND SCREEN  ABO/RH    EKG EKG Interpretation  Date/Time:  Wednesday January 01 2019 14:40:20 EDT Ventricular Rate:  62 PR Interval:    QRS Duration: 90 QT Interval:  477 QTC Calculation: 485 R Axis:   43 Text Interpretation:  Atrial flutter with predominant 4:1 AV block Anteroseptal infarct, old Nonspecific T abnormalities, lateral leads No previous ECGs available Confirmed by Wandra Arthurs ([id number]) on 01/01/2019 3:33:21 PM   Radiology Ct Head Wo Contrast  Result Date: 01/01/2019 CLINICAL DATA:  Polytrauma, can call fall. EXAM: CT HEAD WITHOUT CONTRAST CT CERVICAL SPINE WITHOUT CONTRAST TECHNIQUE: Multidetector CT imaging of the head and cervical spine was performed  following the standard protocol without intravenous contrast. Multiplanar CT image reconstructions of the cervical spine were also generated. COMPARISON:  None. FINDINGS: CT HEAD FINDINGS Brain: Patchy low-density throughout the deep white matter likely chronic small vessel disease. Mild age related volume loss. No acute intracranial abnormality. Specifically, no hemorrhage, hydrocephalus, mass lesion, acute infarction, or significant intracranial injury. Vascular: No hyperdense vessel or unexpected calcification. Skull: No acute calvarial abnormality. Sinuses/Orbits: Visualized paranasal sinuses and mastoids clear. Orbital soft tissues unremarkable. Other: None CT CERVICAL SPINE FINDINGS Alignment: Normal alignment. Skull base and vertebrae: No acute fracture. No primary bone lesion or focal pathologic process. Soft tissues and spinal canal: No prevertebral fluid or swelling. No visible canal hematoma. Disc levels: Diffuse degenerative disc disease with disc space narrowing and spurring most pronounced from C4-5 through C6-7. Diffuse bilateral degenerative facet disease. Upper chest: Aortic and great vessel atherosclerosis. No acute findings. Other: Carotid bulb calcifications.  No acute findings. IMPRESSION: Atrophy, chronic microvascular disease. No acute intracranial abnormality. Degenerative disc and facet disease in the cervical spine. No acute bony abnormality. Electronically Signed   By: Rolm Baptise M.D.   On: 01/01/2019 15:56   Ct Cervical Spine Wo Contrast  Result Date: 01/01/2019 CLINICAL DATA:  Polytrauma, can call fall. EXAM: CT HEAD WITHOUT CONTRAST CT CERVICAL SPINE WITHOUT CONTRAST TECHNIQUE: Multidetector CT imaging of the head and cervical spine was performed following the standard protocol without intravenous contrast. Multiplanar CT image reconstructions of the cervical spine were also generated. COMPARISON:  None. FINDINGS: CT HEAD FINDINGS Brain: Patchy low-density throughout the deep  white matter likely chronic small vessel disease. Mild age related volume loss. No acute intracranial abnormality. Specifically, no hemorrhage, hydrocephalus, mass lesion, acute infarction, or significant intracranial injury. Vascular: No hyperdense vessel or unexpected calcification. Skull: No acute calvarial abnormality. Sinuses/Orbits: Visualized paranasal sinuses and  mastoids clear. Orbital soft tissues unremarkable. Other: None CT CERVICAL SPINE FINDINGS Alignment: Normal alignment. Skull base and vertebrae: No acute fracture. No primary bone lesion or focal pathologic process. Soft tissues and spinal canal: No prevertebral fluid or swelling. No visible canal hematoma. Disc levels: Diffuse degenerative disc disease with disc space narrowing and spurring most pronounced from C4-5 through C6-7. Diffuse bilateral degenerative facet disease. Upper chest: Aortic and great vessel atherosclerosis. No acute findings. Other: Carotid bulb calcifications.  No acute findings. IMPRESSION: Atrophy, chronic microvascular disease. No acute intracranial abnormality. Degenerative disc and facet disease in the cervical spine. No acute bony abnormality. Electronically Signed   By: Charlett NoseKevin  Dover M.D.   On: 01/01/2019 15:56    Procedures .Marland Kitchen.Laceration Repair  Date/Time: 01/01/2019 4:25 PM Performed by: Eber HongMiller, Jasmain Ahlberg, MD Authorized by: Eber HongMiller, Nature Kueker, MD   Consent:    Consent obtained:  Verbal   Consent given by:  Patient   Risks discussed:  Infection, pain, need for additional repair, poor cosmetic result and poor wound healing   Alternatives discussed:  No treatment and delayed treatment Anesthesia (see MAR for exact dosages):    Anesthesia method:  Local infiltration   Local anesthetic:  Lidocaine 1% WITH epi Laceration details:    Length (cm):  2   Depth (mm):  1 Repair type:    Repair type:  Simple Pre-procedure details:    Preparation:  Patient was prepped and draped in usual sterile fashion and imaging  obtained to evaluate for foreign bodies Exploration:    Hemostasis achieved with:  Direct pressure   Wound exploration: wound explored through full range of motion and entire depth of wound probed and visualized     Wound extent: no fascia violation noted, no foreign bodies/material noted, no muscle damage noted, no nerve damage noted, no tendon damage noted, no underlying fracture noted and no vascular damage noted     Contaminated: no   Treatment:    Area cleansed with:  Betadine   Amount of cleaning:  Standard   Irrigation solution:  Sterile saline   Irrigation method:  Syringe Skin repair:    Repair method:  Staples   Number of staples:  2 Approximation:    Approximation:  Close Post-procedure details:    Dressing:  Antibiotic ointment and sterile dressing   Patient tolerance of procedure:  Tolerated well, no immediate complications Comments:         (including critical care time)  Medications Ordered in ED Medications  0.9 %  sodium chloride infusion (has no administration in time range)  fentaNYL (SUBLIMAZE) injection 50 mcg (has no administration in time range)  ondansetron (ZOFRAN) injection 4 mg (has no administration in time range)     Initial Impression / Assessment and Plan / ED Course  I have reviewed the triage vital signs and the nursing notes.  Pertinent labs & imaging results that were available during my care of the patient were reviewed by me and considered in my medical decision making (see chart for details).  Clinical Course as of Dec 31 1625  Wed Jan 01, 2019  1527 Type and screen [facility] [BM]    Clinical Course User Index [BM] Eber HongMiller, Jeronimo Hellberg, MD       We will image the hip, the cervical spine and the head.  The patient will need to have an admission to the hospital for likely hip fracture, primary repair of scalp.  Staples placed - care signed out to Dr. Silverio LayYao -  likely admit for likely hip fracture  Final Clinical  Impressions(s) / ED Diagnoses   Final diagnoses:  None    ED Discharge Orders    None       Eber HongMiller, Viyan Rosamond, MD 01/01/19 1627

## 2019-01-01 NOTE — Progress Notes (Signed)
EKG repeated. Pt refuses to lie still, noted a few copies attempted, not the best quality. Pt is on telemetry, confirmed with tele tech. Bed exit alarm maintained. Spoke with son to give room location and updates concerning pt status.

## 2019-01-01 NOTE — ED Notes (Signed)
Pt c/o being cold  Blankets given  Pt very unhappy  Iv placed for the second time she pulled her other ouot

## 2019-01-01 NOTE — Progress Notes (Signed)
Orthopedics consulted for a right intertrochanteric femur fracture. Plan for formal consultation tomorrow. Will require operative intervention. Please keep NPO after midnight.

## 2019-01-01 NOTE — H&P (Signed)
History and Physical    Lori Ellis ZOX:[id number] DOB: 10-11-1920 DOA: 01/01/2019  PCP: Malka So., MD (Confirm with patient/family/NH records and if not entered, this has to be entered at [facility] point of entry) Patient coming from: Patient is coming from home  I have personally briefly reviewed patient's old medical records in [facility]  Chief Complaint: Right leg pain and head pain after a mechanical fall  HPI: Lori Ellis is a 85 y.o. female with medical history significant of non-insulin-dependent diabetes, degenerative disc disease lumbar spine, hypertension.  Patient has been living in her own home but has 24/7 care.  She is mobile in a wheelchair and does use a walker but has been less mobile over time according to her son.  Patient was at home in her usual state of health when she had a mechanical fall striking her head and her hip.  She had no apparent loss of consciousness.  Because of her significant pain following her fall as well as a laceration to her occiput she presented to [facility] emergency department for evaluation.  She is now admitted for intertrochanteric fracture right hip patient has significant pain at rest and worse pain with movement.  He has been restless and uncomfortable.    ED Course: Evaluation in the emergency department revealed the patient to have a laceration to the right occiput.  This was repaired with interrupted sutures.  She had a CT scan of the head which was unremarkable except for cerebral atrophy.  Patient did have x-rays of the pelvis and hip and was found to have an intertrochanteric fracture of the right hip.  She developed poor bone mineralization was raised in that report.  Patient was referred for admission for fractured hip with operative repair planned after orthopedic consult in the a.m.  Review of Systems: As per HPI otherwise 10 point review of systems negative.    Past Medical History:  Diagnosis Date   Degenerative disc  disease, lumbar    Diabetes mellitus without complication (HCC)    Hypertension     Past Surgical History:  Procedure Laterality Date   BLADDER SURGERY     CHOLECYSTECTOMY       reports that she has never smoked. She has never used smokeless tobacco. She reports that she does not drink alcohol. No history on file for drug.  Allergies  Allergen Reactions   Fentanyl Other (See Comments)    Severe delusions    Penicillins Hives and Rash    Did it involve swelling of the face/tongue/throat, SOB, or low BP? Unk Did it involve sudden or severe rash/hives, skin peeling, or any reaction on the inside of your mouth or nose? Unk Did you need to seek medical attention at a hospital or doctor's office? Unk When did it last happen? "A long time ago" If all above answers are "NO", may proceed with cephalosporin use.    Tetanus Toxoid Hives   Adhesive [Tape] Other (See Comments)    TEARS THE SKIN!!   Advil [Ibuprofen] Other (See Comments)    Was told by MD to not take   Hydrochlorothiazide Other (See Comments)    Hyponatremia    Naproxen Sodium Rash    History reviewed. No pertinent family history.   Social history -patient was married for 47 years and widowed.  She has 3 children.  He has approximately a half a dozen grandchildren and approximately a dozen great-grandchildren.  She worked in Set [employer] until retirement.  She has been living in her own home but requires assistance with her activities of daily living.  She has clearly stated her wishes for DNR status.   Prior to Admission medications   Medication Sig Start Date End Date Taking? Authorizing Provider  acetaminophen (TYLENOL) 500 MG tablet Take 500-1,000 mg by mouth See admin instructions. Take 500 mg by mouth in the morning, 500 mg at 4:30 PM, and 1,000 mg at bedtime   Yes [provider]  amLODipine (NORVASC) 2.5 MG tablet Take 2.5 mg by mouth at bedtime.   Yes [provider]  gabapentin  (NEURONTIN) 300 MG capsule Take 300 mg by mouth at bedtime.   Yes [provider]  levothyroxine (SYNTHROID) 75 MCG tablet Take 75 mcg by mouth daily before breakfast.   Yes [provider]  lisinopril (ZESTRIL) 20 MG tablet Take 20 mg by mouth every morning.    Yes [provider]  Multiple Vitamins-Minerals (CENTRUM SILVER 50+WOMEN) TABS Take 1 tablet by mouth daily with lunch.   Yes [provider]  Multiple Vitamins-Minerals (PRESERVISION AREDS 2) CAPS Take 1 capsule by mouth every evening.   Yes [provider]  propranolol (INDERAL) 20 MG tablet Take 20-40 mg by mouth See admin instructions. Take 40 mg by mouth in the morning and 20 mg at 4:30 PM   Yes [provider]  traMADol (ULTRAM) 50 MG tablet Take 1 tablet (50 mg total) by mouth every 6 (six) hours as needed for moderate pain or severe pain. Patient taking differently: Take 25-50 mg by mouth See admin instructions. Take 25 mg by mouth in the morning, 25 mg at 4:30 PM, 50 mg at bedtime, and an additional 50 mg during the night (as needed for leg pain) 06/21/17  Yes Santos-Sanchez, Chelsea PrimusIdalys, MD  acetaminophen (TYLENOL) 325 MG tablet Take 2 tablets (650 mg total) by mouth every 6 (six) hours as needed for mild pain. Patient not taking: Reported on 01/01/2019 06/21/17   Burna CashSantos-Sanchez, Idalys, MD  Amino Acids-Protein Hydrolys (FEEDING SUPPLEMENT, PRO-STAT SUGAR FREE 64,) LIQD Take 30 mLs by mouth daily at 3 pm. Patient not taking: Reported on 01/01/2019 06/21/17   Burna CashSantos-Sanchez, Idalys, MD  docusate sodium (COLACE) 100 MG capsule Take 1 capsule (100 mg total) by mouth every 12 (twelve) hours. Patient not taking: Reported on 01/01/2019 06/12/17   Ward, Layla MawKristen N, DO  ibuprofen (ADVIL,MOTRIN) 400 MG tablet Take 1 tablet (400 mg total) by mouth 4 (four) times daily. Patient not taking: Reported on 01/01/2019 06/21/17   Burna CashSantos-Sanchez, Idalys, MD  lisinopril (PRINIVIL,ZESTRIL) 10 MG tablet Take 1 tablet  (10 mg total) by mouth daily. Patient not taking: Reported on 01/01/2019 06/21/17   Burna CashSantos-Sanchez, Idalys, MD  ondansetron (ZOFRAN ODT) 4 MG disintegrating tablet Take 1 tablet (4 mg total) by mouth every 6 (six) hours as needed for nausea or vomiting. Patient not taking: Reported on 01/01/2019 06/12/17   Ward, Layla MawKristen N, DO  polyethylene glycol (MIRALAX / GLYCOLAX) packet Take 17 g by mouth daily. Patient not taking: Reported on 01/01/2019 06/22/17   Burna CashSantos-Sanchez, Idalys, MD  senna-docusate (SENOKOT-S) 8.6-50 MG tablet Take 1 tablet by mouth 2 (two) times daily. Patient not taking: Reported on 01/01/2019 06/21/17   Burna CashSantos-Sanchez, Idalys, MD    Physical Exam: Vitals:   01/01/19 1815 01/01/19 1830 01/01/19 1845 01/01/19 1900  BP: (!) 166/119 (!) 163/67 (!) 157/68 (!) 163/91  Pulse:  65 64 76  Resp:  13 13 18   Temp:  TempSrc:      SpO2:  98% 98% 95%  Weight:      Height:        Constitutional: NAD, calm, comfortable Vitals:   01/01/19 1815 01/01/19 1830 01/01/19 1845 01/01/19 1900  BP: (!) 166/119 (!) 163/67 (!) 157/68 (!) 163/91  Pulse:  65 64 76  Resp:  13 13 18   Temp:      TempSrc:      SpO2:  98% 98% 95%  Weight:      Height:       General appearance an elderly woman who looks younger than her stated chronologic age she is uncomfortable but in no acute distress.  She is a good historian  eyes: PERRL, lids and conjunctivae normal with mild periorbital swelling. ENMT: Mucous membranes are moist. Posterior pharynx clear of any exudate or lesions.  Full upper denture, native lower dentition.  Neck: normal, supple, no masses, no thyromegaly Respiratory: clear to auscultation bilaterally, no wheezing, no crackles. Normal respiratory effort. No accessory muscle use.  Cardiovascular: Regular rate and rhythm with frequent ectopy, no murmurs / rubs / gallops. No extremity edema. 2+ pedal pulses. No carotid bruits.  Abdomen: no tenderness, no masses palpated. No hepatosplenomegaly.  Bowel sounds positive.  Musculoskeletal: no clubbing / cyanosis.  Mild outward rotation of the right leg she is able to move both legs although she has quite a bit of pain with movement of the right lower extremity. Good ROM, no contractures.  Diminished muscle tone.  Skin: no rashes, lesions, ulcers. No induration Neurologic: CN 2-12 grossly intact. Sensation intact. Psychiatric: Normal judgment and insight. Alert and oriented x 3. Normal mood.     Labs on Admission: I have personally reviewed following labs and imaging studies  CBC: Recent Labs  Lab 01/01/19 1445  WBC 9.0  NEUTROABS 5.7  HGB 12.3  HCT 38.9  MCV 101.3*  PLT 191   Basic Metabolic Panel: Recent Labs  Lab 01/01/19 1445  NA 141  K 4.0  CL 103  CO2 29  GLUCOSE 166*  BUN 14  CREATININE 1.06*  CALCIUM 9.1   GFR: Estimated Creatinine Clearance: 26.3 mL/min (A) (by C-G formula based on SCr of 1.06 mg/dL (H)). Liver Function Tests: No results for input(s): AST, ALT, ALKPHOS, BILITOT, PROT, ALBUMIN in the last 168 hours. No results for input(s): LIPASE, AMYLASE in the last 168 hours. No results for input(s): AMMONIA in the last 168 hours. Coagulation Profile: Recent Labs  Lab 01/01/19 1445  INR 1.0   Cardiac Enzymes: No results for input(s): CKTOTAL, CKMB, CKMBINDEX, TROPONINI in the last 168 hours. BNP (last 3 results) No results for input(s): PROBNP in the last 8760 hours. HbA1C: No results for input(s): HGBA1C in the last 72 hours. CBG: Recent Labs  Lab 01/01/19 1720  GLUCAP 125*   Lipid Profile: No results for input(s): CHOL, HDL, LDLCALC, TRIG, CHOLHDL, LDLDIRECT in the last 72 hours. Thyroid Function Tests: No results for input(s): TSH, T4TOTAL, FREET4, T3FREE, THYROIDAB in the last 72 hours. Anemia Panel: No results for input(s): VITAMINB12, FOLATE, FERRITIN, TIBC, IRON, RETICCTPCT in the last 72 hours. Urine analysis:    Component Value Date/Time   COLORURINE YELLOW 06/13/2017 0411    APPEARANCEUR HAZY (A) 06/13/2017 0411   LABSPEC 1.026 06/13/2017 0411   PHURINE 5.0 06/13/2017 0411   GLUCOSEU NEGATIVE 06/13/2017 0411   HGBUR NEGATIVE 06/13/2017 0411   BILIRUBINUR NEGATIVE 06/13/2017 0411   KETONESUR NEGATIVE 06/13/2017 0411   PROTEINUR 30 (A) 06/13/2017  0411   NITRITE NEGATIVE 06/13/2017 0411   LEUKOCYTESUR NEGATIVE 06/13/2017 0411    Radiological Exams on Admission: Dg Chest 1 View  Result Date: 01/01/2019 CLINICAL DATA:  Preop EXAM: CHEST  1 VIEW COMPARISON:  None. FINDINGS: Cardiomegaly, diffuse aortic atherosclerosis. Lungs clear. No effusions or edema. No acute bony abnormality. IMPRESSION: Cardiomegaly.  No active disease. Electronically Signed   By: Charlett NoseKevin  Dover M.D.   On: 01/01/2019 17:17   Ct Head Wo Contrast  Result Date: 01/01/2019 CLINICAL DATA:  Polytrauma, can call fall. EXAM: CT HEAD WITHOUT CONTRAST CT CERVICAL SPINE WITHOUT CONTRAST TECHNIQUE: Multidetector CT imaging of the head and cervical spine was performed following the standard protocol without intravenous contrast. Multiplanar CT image reconstructions of the cervical spine were also generated. COMPARISON:  None. FINDINGS: CT HEAD FINDINGS Brain: Patchy low-density throughout the deep white matter likely chronic small vessel disease. Mild age related volume loss. No acute intracranial abnormality. Specifically, no hemorrhage, hydrocephalus, mass lesion, acute infarction, or significant intracranial injury. Vascular: No hyperdense vessel or unexpected calcification. Skull: No acute calvarial abnormality. Sinuses/Orbits: Visualized paranasal sinuses and mastoids clear. Orbital soft tissues unremarkable. Other: None CT CERVICAL SPINE FINDINGS Alignment: Normal alignment. Skull base and vertebrae: No acute fracture. No primary bone lesion or focal pathologic process. Soft tissues and spinal canal: No prevertebral fluid or swelling. No visible canal hematoma. Disc levels: Diffuse degenerative disc disease  with disc space narrowing and spurring most pronounced from C4-5 through C6-7. Diffuse bilateral degenerative facet disease. Upper chest: Aortic and great vessel atherosclerosis. No acute findings. Other: Carotid bulb calcifications.  No acute findings. IMPRESSION: Atrophy, chronic microvascular disease. No acute intracranial abnormality. Degenerative disc and facet disease in the cervical spine. No acute bony abnormality. Electronically Signed   By: Charlett NoseKevin  Dover M.D.   On: 01/01/2019 15:56   Ct Cervical Spine Wo Contrast  Result Date: 01/01/2019 CLINICAL DATA:  Polytrauma, can call fall. EXAM: CT HEAD WITHOUT CONTRAST CT CERVICAL SPINE WITHOUT CONTRAST TECHNIQUE: Multidetector CT imaging of the head and cervical spine was performed following the standard protocol without intravenous contrast. Multiplanar CT image reconstructions of the cervical spine were also generated. COMPARISON:  None. FINDINGS: CT HEAD FINDINGS Brain: Patchy low-density throughout the deep white matter likely chronic small vessel disease. Mild age related volume loss. No acute intracranial abnormality. Specifically, no hemorrhage, hydrocephalus, mass lesion, acute infarction, or significant intracranial injury. Vascular: No hyperdense vessel or unexpected calcification. Skull: No acute calvarial abnormality. Sinuses/Orbits: Visualized paranasal sinuses and mastoids clear. Orbital soft tissues unremarkable. Other: None CT CERVICAL SPINE FINDINGS Alignment: Normal alignment. Skull base and vertebrae: No acute fracture. No primary bone lesion or focal pathologic process. Soft tissues and spinal canal: No prevertebral fluid or swelling. No visible canal hematoma. Disc levels: Diffuse degenerative disc disease with disc space narrowing and spurring most pronounced from C4-5 through C6-7. Diffuse bilateral degenerative facet disease. Upper chest: Aortic and great vessel atherosclerosis. No acute findings. Other: Carotid bulb calcifications.  No  acute findings. IMPRESSION: Atrophy, chronic microvascular disease. No acute intracranial abnormality. Degenerative disc and facet disease in the cervical spine. No acute bony abnormality. Electronically Signed   By: Charlett NoseKevin  Dover M.D.   On: 01/01/2019 15:56   Chest Portable 1 View  Result Date: 01/01/2019 CLINICAL DATA:  Preoperative assessment for hip fracture EXAM: PORTABLE CHEST 1 VIEW COMPARISON:  January 01, 2019 study obtained earlier in the day FINDINGS: No evident edema or consolidation. Heart is borderline enlarged with pulmonary vascularity normal. No adenopathy.  There is aortic atherosclerosis. Bones are osteoporotic. IMPRESSION: No edema or consolidation. Borderline cardiac enlargement. Aortic Atherosclerosis (ICD10-I70.0). Bones osteoporotic. Electronically Signed   By: Lowella Grip III M.D.   On: 01/01/2019 19:46   Dg Hip Unilat With Pelvis 2-3 Views Right  Result Date: 01/01/2019 CLINICAL DATA:  Preop images.  Hip fracture. EXAM: DG HIP (WITH OR WITHOUT PELVIS) 2-3V RIGHT COMPARISON:  None. FINDINGS: Three views of the pelvis and right proximal femur showing intertrochanteric fracture. Primary fracture components are displaced approximately 1 cm. There is varus angulation. There are separate greater lesser trochanteric fracture components. No other fractures. No bone lesions. Skeletal structures are demineralized. Hip joints, SI joints and symphysis pubis are normally aligned. IMPRESSION: Intertrochanteric fracture of the proximal right femur as described. Electronically Signed   By: Lajean Manes M.D.   On: 01/01/2019 17:18    EKG: Independently reviewed.  Patient in atrial flutter with an AV block 4-1, ventricular rate of 62, question of an old anterior infarct.  Assessment/Plan Active Problems:   CKD (chronic kidney disease) stage 3, GFR 30-59 ml/min (HCC)   Hypothyroidism   Type II diabetes mellitus with renal manifestations (HCC)   Benign essential HTN   Closed  intertrochanteric fracture, right, initial encounter ([place])   Atrial flutter by electrocardiogram ([place])  (please populate well all problems here in Problem List. (For example, if patient is on BP meds at home and you resume or decide to hold them, it is a problem that needs to be her. Same for CAD, COPD, HLD and so on)   1.  Intertrochanteric fracture right -patient was ambulating with difficulty at home but was predominantly wheelchair-bound.  Discussed with her, and her son, increased limitations she would have with a persistent, unrepaired intertrochanteric fracture.  Patient is willing to proceed with orthopedic consultation and surgical repair Plan admit to medical telemetry  Orthopedic consult in a.m. with treatment to be planned by surgeon  Routine protocols for pain management.  2 diabetes -patient has been stable based upon review of notes of her primary care physician.  Serum glucose at admission was 133.  No A1c on record.  Patient had been on oral medications at home. Plan glycemic control with sliding scale  3.  Hypothyroidism -continue home medications  4.  Hypertension -he elevated in the emergency department secondary to pain. Plan - we will be permissive in regards to systolic blood pressure.  Continue home medication  5.  Cardiovascular -no history of coronary artery disease or arrhythmia.  No old EKG for comparison.  Patient is hemodynamically stable and has a controlled ventricular rate. Plan no change in medications at this time.  Cardiology consultation in a.m.(on-call fellow notified) if she has persistent atrial flutter or change in   hemodynamic status  DVT prophylaxis: Lovenox (Lovenox/Heparin/SCD's/anticoagulated/None (if comfort care) Code Status: DNR (Full/Partial (specify details) Family Communication: Spoke at length with the patient's middle son splitting her diagnosis and proposed plan of treatment.  (Specify name, relationship. Do not write "discussed with  patient". Specify tel # if discussed over the phone) Disposition Plan: [facility] facility for rehab postoperatively medically stable in 3 to 4 days.  (specify when and where you expect patient to be discharged) Consults called: Orthopedics -Dr. Ophelia Charter (with names) Admission status: Inpatient to medical telemetry (inpatient / obs / tele / medical floor / SDU)   Adella Hare MD [facility] Pager [phone]  If 7PM-7AM, please contact night-coverage [email or web address] Password [facility]  01/01/2019, 8:24 PM

## 2019-01-01 NOTE — ED Provider Notes (Signed)
  Physical Exam  BP (!) 184/72   Pulse 66   Temp 98.5 F (36.9 C) (Oral)   Resp 20   Ht 5\' 3"  (1.6 m)   Wt 54.4 kg   SpO2 100%   BMI 21.26 kg/m   Physical Exam  ED Course/Procedures   Clinical Course as of Jan 01 1807  Wed Jan 01, 2019  1527 Type and screen [place] [BM]    Clinical Course User Index [BM] Noemi Chapel, MD    Procedures  MDM  Care assumed at 4 pm.  Patient had a mechanical fall and hit the right hip.  Also has a right scalp laceration was stapled by Dr. Sabra Heck.  Signout pending hip x-ray as patient clinically has a hip fracture.   6:09 PM Xray showed intertrochanteric fracture. CT head/neck unremarkable. Dr. Griffin Basil from ortho to consult. Will admit for pain control, preop clearance.      Drenda Freeze, MD 01/01/19 [phone]

## 2019-01-01 NOTE — ED Notes (Signed)
Pt is allergic to fentanyl  She has an order of the same  In her chart

## 2019-01-01 NOTE — ED Notes (Signed)
Lori Ellis  [phone]

## 2019-01-01 NOTE — ED Triage Notes (Signed)
Pt arrives via EMS from home with mechanical fall. Called EMS, noted right leg deformity with outward rotation. Laceration to right head. Bleeding controlled. No thinners. 197mcg fentanyl PTA, 20g LAC.

## 2019-01-01 NOTE — ED Notes (Signed)
Report called to rn on 5n 

## 2019-01-01 NOTE — ED Notes (Signed)
Back to xray 

## 2019-01-02 ENCOUNTER — Encounter (HOSPITAL_COMMUNITY): Payer: Self-pay

## 2019-01-02 ENCOUNTER — Inpatient Hospital Stay (HOSPITAL_COMMUNITY): Payer: PPO

## 2019-01-02 ENCOUNTER — Other Ambulatory Visit: Payer: Self-pay

## 2019-01-02 ENCOUNTER — Encounter (HOSPITAL_COMMUNITY)
Admission: EM | Disposition: A | Discharge status: Skilled Nursing Facility | Payer: Self-pay | Source: Home / Self Care | Attending: Internal Medicine

## 2019-01-02 ENCOUNTER — Inpatient Hospital Stay (HOSPITAL_COMMUNITY): Payer: PPO | Admitting: Critical Care Medicine

## 2019-01-02 DIAGNOSIS — I10 Essential (primary) hypertension: Secondary | ICD-10-CM

## 2019-01-02 DIAGNOSIS — N183 Chronic kidney disease, stage 3 (moderate): Secondary | ICD-10-CM

## 2019-01-02 HISTORY — PX: INTRAMEDULLARY (IM) NAIL INTERTROCHANTERIC: SHX5875

## 2019-01-02 LAB — PROTIME-INR
INR: 1 (ref 0.8–1.2)
Prothrombin Time: 13.2 seconds (ref 11.4–15.2)

## 2019-01-02 LAB — CBC
HCT: 38.2 % (ref 36.0–46.0)
Hemoglobin: 12.7 g/dL (ref 12.0–15.0)
MCH: 32.5 pg (ref 26.0–34.0)
MCHC: 33.2 g/dL (ref 30.0–36.0)
MCV: 97.7 fL (ref 80.0–100.0)
Platelets: 196 10*3/uL (ref 150–400)
RBC: 3.91 MIL/uL (ref 3.87–5.11)
RDW: 12.2 % (ref 11.5–15.5)
WBC: 12.8 10*3/uL — ABNORMAL HIGH (ref 4.0–10.5)
nRBC: 0 % (ref 0.0–0.2)

## 2019-01-02 LAB — GLUCOSE, CAPILLARY
Glucose-Capillary: 126 mg/dL — ABNORMAL HIGH (ref 70–99)
Glucose-Capillary: 157 mg/dL — ABNORMAL HIGH (ref 70–99)
Glucose-Capillary: 138 mg/dL — ABNORMAL HIGH (ref 70–99)
Glucose-Capillary: 183 mg/dL — ABNORMAL HIGH (ref 70–99)
Glucose-Capillary: 163 mg/dL — ABNORMAL HIGH (ref 70–99)

## 2019-01-02 LAB — BASIC METABOLIC PANEL
Anion gap: 15 (ref 5–15)
BUN: 16 mg/dL (ref 8–23)
CO2: 26 mmol/L (ref 22–32)
Calcium: 9.4 mg/dL (ref 8.9–10.3)
Chloride: 101 mmol/L (ref 98–111)
Creatinine, Ser: 1.08 mg/dL — ABNORMAL HIGH (ref 0.44–1.00)
GFR calc Af Amer: 51 mL/min — ABNORMAL LOW (ref >60)
GFR calc non Af Amer: 44 mL/min — ABNORMAL LOW (ref >60)
Glucose, Bld: 160 mg/dL — ABNORMAL HIGH (ref 70–99)
Potassium: 3.4 mmol/L — ABNORMAL LOW (ref 3.5–5.1)
Sodium: 142 mmol/L (ref 135–145)

## 2019-01-02 SURGERY — FIXATION, FRACTURE, INTERTROCHANTERIC, WITH INTRAMEDULLARY ROD
Anesthesia: General | Site: Hip | Laterality: Right

## 2019-01-02 MED ORDER — LIDOCAINE 2% (20 MG/ML) 5 ML SYRINGE
INTRAMUSCULAR | Status: DC | PRN
  Administered 2019-01-02: 25 mg via INTRAVENOUS

## 2019-01-02 MED ORDER — DOCUSATE SODIUM 100 MG PO CAPS
100.0000 mg | ORAL_CAPSULE | Freq: Two times a day (BID) | ORAL | Status: DC
  Administered 2019-01-02 – 2019-01-10 (×11): 100 mg via ORAL
  Filled 2019-01-02 (×14): qty 1

## 2019-01-02 MED ORDER — ACETAMINOPHEN 500 MG PO TABS
500.0000 mg | ORAL_TABLET | Freq: Four times a day (QID) | ORAL | Status: DC
  Administered 2019-01-02 – 2019-01-10 (×27): 500 mg via ORAL
  Filled 2019-01-02 (×29): qty 1

## 2019-01-02 MED ORDER — MELATONIN 3 MG PO TABS
3.0000 mg | ORAL_TABLET | Freq: Every evening | ORAL | Status: DC | PRN
  Filled 2019-01-02: qty 1

## 2019-01-02 MED ORDER — SUCCINYLCHOLINE CHLORIDE 200 MG/10ML IV SOSY
PREFILLED_SYRINGE | INTRAVENOUS | Status: AC
  Filled 2019-01-02: qty 10

## 2019-01-02 MED ORDER — SODIUM CHLORIDE 0.9 % IV SOLN
INTRAVENOUS | Status: DC | PRN
  Administered 2019-01-02: 13:00:00 25 ug/min via INTRAVENOUS

## 2019-01-02 MED ORDER — DEXAMETHASONE SODIUM PHOSPHATE 10 MG/ML IJ SOLN
INTRAMUSCULAR | Status: AC
  Filled 2019-01-02: qty 2

## 2019-01-02 MED ORDER — SUGAMMADEX SODIUM 200 MG/2ML IV SOLN
INTRAVENOUS | Status: DC | PRN
  Administered 2019-01-02: 110 mg via INTRAVENOUS

## 2019-01-02 MED ORDER — ONDANSETRON HCL 4 MG/2ML IJ SOLN
INTRAMUSCULAR | Status: DC | PRN
  Administered 2019-01-02: 4 mg via INTRAVENOUS

## 2019-01-02 MED ORDER — METOCLOPRAMIDE HCL 5 MG PO TABS: 5.0000 mg | ORAL_TABLET | Freq: Three times a day (TID) | ORAL | Status: DC | PRN

## 2019-01-02 MED ORDER — SODIUM CHLORIDE (PF) 0.9 % IJ SOLN
INTRAMUSCULAR | Status: AC
  Filled 2019-01-02: qty 10

## 2019-01-02 MED ORDER — ONDANSETRON HCL 4 MG/2ML IJ SOLN
INTRAMUSCULAR | Status: AC
  Filled 2019-01-02: qty 4

## 2019-01-02 MED ORDER — SUFENTANIL CITRATE 50 MCG/ML IV SOLN
INTRAVENOUS | Status: AC
  Filled 2019-01-02: qty 1

## 2019-01-02 MED ORDER — CLINDAMYCIN PHOSPHATE 600 MG/50ML IV SOLN
600.0000 mg | INTRAVENOUS | Status: DC
  Filled 2019-01-02: qty 50

## 2019-01-02 MED ORDER — LIDOCAINE 2% (20 MG/ML) 5 ML SYRINGE
INTRAMUSCULAR | Status: AC
  Filled 2019-01-02: qty 10

## 2019-01-02 MED ORDER — PHENYLEPHRINE 40 MCG/ML (10ML) SYRINGE FOR IV PUSH (FOR BLOOD PRESSURE SUPPORT)
PREFILLED_SYRINGE | INTRAVENOUS | Status: AC
  Filled 2019-01-02: qty 40

## 2019-01-02 MED ORDER — PHENYLEPHRINE 40 MCG/ML (10ML) SYRINGE FOR IV PUSH (FOR BLOOD PRESSURE SUPPORT)
PREFILLED_SYRINGE | INTRAVENOUS | Status: DC | PRN
  Administered 2019-01-02: 40 ug via INTRAVENOUS
  Administered 2019-01-02: 80 ug via INTRAVENOUS
  Administered 2019-01-02 (×3): 40 ug via INTRAVENOUS
  Administered 2019-01-02: 80 ug via INTRAVENOUS
  Administered 2019-01-02: 40 ug via INTRAVENOUS

## 2019-01-02 MED ORDER — DEXAMETHASONE SODIUM PHOSPHATE 10 MG/ML IJ SOLN
INTRAMUSCULAR | Status: DC | PRN
  Administered 2019-01-02: 4 mg via INTRAVENOUS

## 2019-01-02 MED ORDER — ONDANSETRON HCL 4 MG/2ML IJ SOLN: 4.0000 mg | Freq: Four times a day (QID) | INTRAMUSCULAR | Status: DC | PRN

## 2019-01-02 MED ORDER — SUFENTANIL CITRATE 50 MCG/ML IV SOLN
INTRAVENOUS | Status: DC | PRN
  Administered 2019-01-02 (×2): 5 ug via INTRAVENOUS

## 2019-01-02 MED ORDER — ROCURONIUM BROMIDE 10 MG/ML (PF) SYRINGE
PREFILLED_SYRINGE | INTRAVENOUS | Status: DC | PRN
  Administered 2019-01-02: 50 mg via INTRAVENOUS

## 2019-01-02 MED ORDER — LACTATED RINGERS IV SOLN
INTRAVENOUS | Status: DC | PRN
  Administered 2019-01-02: 12:00:00 via INTRAVENOUS

## 2019-01-02 MED ORDER — VITAMIN C 500 MG PO TABS
500.0000 mg | ORAL_TABLET | Freq: Every day | ORAL | Status: DC
  Administered 2019-01-02 – 2019-01-10 (×9): 500 mg via ORAL
  Filled 2019-01-02 (×9): qty 1

## 2019-01-02 MED ORDER — MENTHOL 3 MG MT LOZG: 1.0000 | LOZENGE | OROMUCOSAL | Status: DC | PRN

## 2019-01-02 MED ORDER — PROPOFOL 10 MG/ML IV BOLUS
INTRAVENOUS | Status: AC
  Filled 2019-01-02: qty 20

## 2019-01-02 MED ORDER — FENTANYL CITRATE (PF) 250 MCG/5ML IJ SOLN
INTRAMUSCULAR | Status: AC
  Filled 2019-01-02: qty 5

## 2019-01-02 MED ORDER — CLINDAMYCIN PHOSPHATE 600 MG/50ML IV SOLN
600.0000 mg | Freq: Four times a day (QID) | INTRAVENOUS | Status: AC
  Administered 2019-01-02 (×2): 600 mg via INTRAVENOUS
  Filled 2019-01-02 (×2): qty 50

## 2019-01-02 MED ORDER — ONDANSETRON HCL 4 MG PO TABS: 4.0000 mg | ORAL_TABLET | Freq: Four times a day (QID) | ORAL | Status: DC | PRN

## 2019-01-02 MED ORDER — ENOXAPARIN SODIUM 30 MG/0.3ML ~~LOC~~ SOLN
30.0000 mg | SUBCUTANEOUS | Status: DC
  Administered 2019-01-03 – 2019-01-10 (×8): 30 mg via SUBCUTANEOUS
  Filled 2019-01-02 (×9): qty 0.3

## 2019-01-02 MED ORDER — 0.9 % SODIUM CHLORIDE (POUR BTL) OPTIME
TOPICAL | Status: DC | PRN
  Administered 2019-01-02: 1000 mL

## 2019-01-02 MED ORDER — PHENOL 1.4 % MT LIQD: 1.0000 | OROMUCOSAL | Status: DC | PRN

## 2019-01-02 MED ORDER — ROCURONIUM BROMIDE 10 MG/ML (PF) SYRINGE
PREFILLED_SYRINGE | INTRAVENOUS | Status: AC
  Filled 2019-01-02: qty 10

## 2019-01-02 MED ORDER — METOCLOPRAMIDE HCL 5 MG/ML IJ SOLN: 5.0000 mg | Freq: Three times a day (TID) | INTRAMUSCULAR | Status: DC | PRN

## 2019-01-02 MED ORDER — FENTANYL CITRATE (PF) 100 MCG/2ML IJ SOLN: 25.0000 ug | INTRAMUSCULAR | Status: DC | PRN

## 2019-01-02 MED ORDER — PROPOFOL 10 MG/ML IV BOLUS
INTRAVENOUS | Status: DC | PRN
  Administered 2019-01-02: 60 mg via INTRAVENOUS

## 2019-01-02 MED ORDER — VITAMIN D 25 MCG (1000 UNIT) PO TABS
2000.0000 [IU] | ORAL_TABLET | Freq: Two times a day (BID) | ORAL | Status: DC
  Administered 2019-01-02 – 2019-01-08 (×12): 2000 [IU] via ORAL
  Administered 2019-01-08: 1000 [IU] via ORAL
  Administered 2019-01-09 – 2019-01-10 (×3): 2000 [IU] via ORAL
  Filled 2019-01-02 (×17): qty 2

## 2019-01-02 SURGICAL SUPPLY — 46 items
BIT DRILL 4.3MMS DISTAL GRDTED (BIT) ×1 IMPLANT
BNDG COHESIVE 4X5 TAN STRL (GAUZE/BANDAGES/DRESSINGS) ×3 IMPLANT
BNDG GAUZE ELAST 4 BULKY (GAUZE/BANDAGES/DRESSINGS) ×3 IMPLANT
CLOSURE STERI-STRIP 1/2X4 (GAUZE/BANDAGES/DRESSINGS) ×1
CLSR STERI-STRIP ANTIMIC 1/2X4 (GAUZE/BANDAGES/DRESSINGS) ×2 IMPLANT
COVER PERINEAL POST (MISCELLANEOUS) ×3 IMPLANT
COVER SURGICAL LIGHT HANDLE (MISCELLANEOUS) ×3 IMPLANT
COVER WAND RF STERILE (DRAPES) ×3 IMPLANT
DRAPE C-ARM 42X72 X-RAY (DRAPES) ×3 IMPLANT
DRAPE C-ARMOR (DRAPES) ×3 IMPLANT
DRAPE INCISE IOBAN 66X45 STRL (DRAPES) ×3 IMPLANT
DRAPE STERI IOBAN 125X83 (DRAPES) ×3 IMPLANT
DRILL 4.3MMS DISTAL GRADUATED (BIT) ×3
DRSG AQUACEL AG ADV 3.5X 6 (GAUZE/BANDAGES/DRESSINGS) ×3 IMPLANT
DRSG MEPILEX BORDER 4X4 (GAUZE/BANDAGES/DRESSINGS) ×3 IMPLANT
DURAPREP 26ML APPLICATOR (WOUND CARE) ×3 IMPLANT
ELECT REM PT RETURN 9FT ADLT (ELECTROSURGICAL) ×3
ELECTRODE REM PT RTRN 9FT ADLT (ELECTROSURGICAL) ×1 IMPLANT
GLOVE BIOGEL PI IND STRL 8 (GLOVE) ×1 IMPLANT
GLOVE BIOGEL PI INDICATOR 8 (GLOVE) ×2
GLOVE BIOGEL PI ORTHO PRO SZ8 (GLOVE)
GLOVE ECLIPSE 8.0 STRL XLNG CF (GLOVE) ×6 IMPLANT
GLOVE PI ORTHO PRO STRL SZ8 (GLOVE) IMPLANT
GLOVE SURG ORTHO 8.0 STRL STRW (GLOVE) IMPLANT
GOWN STRL REUS W/ TWL LRG LVL3 (GOWN DISPOSABLE) ×2 IMPLANT
GOWN STRL REUS W/TWL 2XL LVL3 (GOWN DISPOSABLE) IMPLANT
GOWN STRL REUS W/TWL LRG LVL3 (GOWN DISPOSABLE) ×4
GUIDEPIN 3.2X17.5 THRD DISP (PIN) ×3 IMPLANT
GUIDEWIRE BALL NOSE 80CM (WIRE) ×3 IMPLANT
HFN RH 130 DEG 9MM X 360MM (Nail) ×3 IMPLANT
HIP FRA NAIL LAG SCREW 10.5X90 (Orthopedic Implant) ×3 IMPLANT
KIT TURNOVER KIT B (KITS) ×3 IMPLANT
MANIFOLD NEPTUNE II (INSTRUMENTS) ×3 IMPLANT
NS IRRIG 1000ML POUR BTL (IV SOLUTION) ×3 IMPLANT
PACK GENERAL/GYN (CUSTOM PROCEDURE TRAY) ×3 IMPLANT
PAD ARMBOARD 7.5X6 YLW CONV (MISCELLANEOUS) ×3 IMPLANT
SCREW BONE CORTICAL 5.0X40 (Screw) ×3 IMPLANT
SCREW LAG HIP FRA NAIL 10.5X90 (Orthopedic Implant) ×1 IMPLANT
STAPLER VISISTAT 35W (STAPLE) ×3 IMPLANT
SUT MNCRL AB 4-0 PS2 18 (SUTURE) ×3 IMPLANT
SUT VIC AB 0 CT1 27 (SUTURE) ×2
SUT VIC AB 0 CT1 27XBRD ANBCTR (SUTURE) ×1 IMPLANT
SUT VIC AB 2-0 CT1 27 (SUTURE) ×2
SUT VIC AB 2-0 CT1 TAPERPNT 27 (SUTURE) ×1 IMPLANT
TOWEL GREEN STERILE (TOWEL DISPOSABLE) ×3 IMPLANT
TOWEL GREEN STERILE FF (TOWEL DISPOSABLE) ×3 IMPLANT

## 2019-01-02 NOTE — Progress Notes (Signed)
Pt.'s son Fritz Pickerel updated post surgery.

## 2019-01-02 NOTE — Progress Notes (Signed)
PROGRESS NOTE    Lori Ellis  ZOX:[id number] DOB: 10/30/1920 DOA: 01/01/2019 PCP: Lori Ellis    Brief Narrative:   Lori Ellis is a 85 y.o. female with medical history significant of non-insulin-dependent diabetes, degenerative disc disease lumbar spine, hypertension.  Patient has been living in her own home but has 24/7 care.  She is mobile in a wheelchair and does use a walker but has been less mobile over time according to her son.  Patient was at home in her usual state of health when she had a mechanical fall striking her head and her hip.  She had no apparent loss of consciousness.  Because of her significant pain following her fall as well as a laceration to her occiput she presented to [facility] emergency department for evaluation.  She is now admitted for intertrochanteric fracture right hip patient has significant pain at rest and worse pain with movement.  He has been restless and uncomfortable.    ED Course: Evaluation in the emergency department revealed the patient to have a laceration to the right occiput.  This was repaired with interrupted sutures.  She had a CT scan of the head which was unremarkable except for cerebral atrophy.  Patient did have x-rays of the pelvis and hip and was found to have an intertrochanteric fracture of the right hip.  She developed poor bone mineralization was raised in that report.  Patient was referred for admission for fractured hip with operative repair planned after orthopedic consult in the a.m.   Assessment & Plan:   Active Problems:   CKD (chronic kidney disease) stage 3, GFR 30-59 ml/min (HCC)   Hypothyroidism   Type II diabetes mellitus with renal manifestations (HCC)   Benign essential HTN   Closed intertrochanteric fracture, right, initial encounter (HCC)   Atrial flutter by electrocardiogram Lori Ellis(HCC)  Right intertrochanteric hip fracture Patient presenting from home with decreased ability to ambulate with right-sided hip  pain.  X-ray on admission with findings of right intratrochanteric hip fracture. --Orthopedics consulted and plan surgical intervention on 01/02/2019 --N.p.o. --Fall precautions --Continue pain management with Norco, morphine prn --PT/OT following surgical intervention  Essential hypertension Blood pressure well controlled 134/64 this morning. --Continue home amlodipine 2.5 mg p.o. daily, lisinopril 20 mg p.o. daily, propranolol 20 mg p.o. daily --Continue monitor blood pressure closely  Hypothyroidism: Continue levothyroxine 75 mcg p.o. daily  Acute hospital-acquired delirium Patient overnight with increased confusion with agitation.  Likely secondary to advanced age, possible underlying dementia, and active pain associated with right hip fracture. --Continue restraints to preclude removal of IV catheters --Delirium precautions, blinds open during the day/closed at night, TV off and quiet at night --Will order melatonin    DVT prophylaxis: Lovenox Code Status: DNR Family Communication: None Disposition Plan: Inpatient hospitalization, likely will need SNF on discharge   Consultants:   Orthopedic surgery  Procedures:   None  Antimicrobials:   Perioperative clindamycin   Subjective: Patient seen and examined at bedside, agitated requesting removal of restraints.  Believes that she is at home.  Complains of hip pain.  Awaiting surgical intervention this morning.  No other complaints at this time.  Denies headache, no chest pain, no shortness of breath, no palpitations, no abdominal pain.  No other acute concerns overnight per nursing staff.  Objective: Vitals:   01/01/19 1845 01/01/19 1900 01/01/19 2049 01/02/19 0358  BP: (!) 157/68 (!) 163/91 (!) 146/79 134/64  Pulse: 64 76 65 80  Resp: 13 18  17 16  Temp:   97.7 F (36.5 C) 99.1 F (37.3 C)  TempSrc:   Oral Oral  SpO2: 98% 95% 96% 98%  Weight:      Height:        Intake/Output Summary (Last 24 hours) at  01/02/2019 1137 Last data filed at 01/02/2019 0900 Gross per 24 hour  Intake 30.36 ml  Output 250 ml  Net -219.64 ml   Filed Weights   01/01/19 1436  Weight: 54.4 kg    Examination:  General exam: Agitated, elderly Respiratory system: Clear to auscultation. Respiratory effort normal. Cardiovascular system: S1 & S2 heard, RRR. No JVD, murmurs, rubs, gallops or clicks. No pedal edema. Gastrointestinal system: Abdomen is nondistended, soft and nontender. No organomegaly or masses felt. Normal bowel sounds heard. Central nervous system: Alert and oriented. No focal neurological deficits. Extremities: Right lower extremity with outward rotation, moving all extremities independently, decreased muscle tone Skin: No rashes, lesions or ulcers Psychiatry: Judgement and insight appear normal. Mood & affect appropriate.     Data Reviewed: I have personally reviewed following labs and imaging studies  CBC: Recent Labs  Lab 01/01/19 1445 01/02/19 0859  WBC 9.0 12.8*  NEUTROABS 5.7  --   HGB 12.3 12.7  HCT 38.9 38.2  MCV 101.3* 97.7  PLT 191 196   Basic Metabolic Panel: Recent Labs  Lab 01/01/19 1445 01/02/19 0859  NA 141 142  K 4.0 3.4*  CL 103 101  CO2 29 26  GLUCOSE 166* 160*  BUN 14 16  CREATININE 1.06* 1.08*  CALCIUM 9.1 9.4   GFR: Estimated Creatinine Clearance: 25.8 mL/min (A) (by C-G formula based on SCr of 1.08 mg/dL (H)). Liver Function Tests: No results for input(s): AST, ALT, ALKPHOS, BILITOT, PROT, ALBUMIN in the last 168 hours. No results for input(s): LIPASE, AMYLASE in the last 168 hours. No results for input(s): AMMONIA in the last 168 hours. Coagulation Profile: Recent Labs  Lab 01/01/19 1445 01/02/19 0859  INR 1.0 1.0   Cardiac Enzymes: No results for input(s): CKTOTAL, CKMB, CKMBINDEX, TROPONINI in the last 168 hours. BNP (last 3 results) No results for input(s): PROBNP in the last 8760 hours. HbA1C: Recent Labs    01/01/19 1941  HGBA1C  5.9*   CBG: Recent Labs  Lab 01/01/19 1720 01/02/19 0749 01/02/19 1106  GLUCAP 125* 126* 157*   Lipid Profile: No results for input(s): CHOL, HDL, LDLCALC, TRIG, CHOLHDL, LDLDIRECT in the last 72 hours. Thyroid Function Tests: No results for input(s): TSH, T4TOTAL, FREET4, T3FREE, THYROIDAB in the last 72 hours. Anemia Panel: No results for input(s): VITAMINB12, FOLATE, FERRITIN, TIBC, IRON, RETICCTPCT in the last 72 hours. Sepsis Labs: No results for input(s): PROCALCITON, LATICACIDVEN in the last 168 hours.  Recent Results (from the past 240 hour(s))  SARS Coronavirus 2 (CEPHEID - Performed in [facility] hospital lab), Hosp Order     Status: None   Collection Time: 01/01/19  7:01 PM   Specimen: Nasopharyngeal Swab  Result Value Ref Range Status   SARS Coronavirus 2 NEGATIVE NEGATIVE Final    Comment: (NOTE) If result is NEGATIVE SARS-CoV-2 target nucleic acids are NOT DETECTED. The SARS-CoV-2 RNA is generally detectable in upper and lower  respiratory specimens during the acute phase of infection. The lowest  concentration of SARS-CoV-2 viral copies this assay can detect is 250  copies / mL. A negative result does not preclude SARS-CoV-2 infection  and should not be used as the sole basis for treatment or other  patient management decisions.  A negative result may occur with  improper specimen collection / handling, submission of specimen other  than nasopharyngeal swab, presence of viral mutation(s) within the  areas targeted by this assay, and inadequate number of viral copies  (<250 copies / mL). A negative result must be combined with clinical  observations, patient history, and epidemiological information. If result is POSITIVE SARS-CoV-2 target nucleic acids are DETECTED. The SARS-CoV-2 RNA is generally detectable in upper and lower  respiratory specimens dur ing the acute phase of infection.  Positive  results are indicative of active infection with SARS-CoV-2.   Clinical  correlation with patient history and other diagnostic information is  necessary to determine patient infection status.  Positive results do  not rule out bacterial infection or co-infection with other viruses. If result is PRESUMPTIVE POSTIVE SARS-CoV-2 nucleic acids MAY BE PRESENT.   A presumptive positive result was obtained on the submitted specimen  and confirmed on repeat testing.  While 2019 novel coronavirus  (SARS-CoV-2) nucleic acids may be present in the submitted sample  additional confirmatory testing may be necessary for epidemiological  and / or clinical management purposes  to differentiate between  SARS-CoV-2 and other Sarbecovirus currently known to infect humans.  If clinically indicated additional testing with an alternate test  methodology [phone] is advised. The SARS-CoV-2 RNA is generally  detectable in upper and lower respiratory sp ecimens during the acute  phase of infection. The expected result is Negative. Fact Sheet for Patients:  BoilerBrush.com.cyhttps://www.fda.gov/media/136312/download Fact Sheet for Healthcare Providers: https://pope.com/https://www.fda.gov/media/136313/download This test is not yet approved or cleared by the [place] FDA and has been authorized for detection and/or diagnosis of SARS-CoV-2 by FDA under an Emergency Use Authorization (EUA).  This EUA will remain in effect (meaning this test can be used) for the duration of the COVID-19 declaration under Section 564(b)(1) of the Act, 21 U.S.C. section 360bbb-3(b)(1), unless the authorization is terminated or revoked sooner. Performed at [facility], [address] [place], [place] [address]   Surgical pcr screen     Status: Abnormal   Collection Time: 01/01/19  9:17 PM   Specimen: Nasal Mucosa; Nasal Swab  Result Value Ref Range Status   MRSA, PCR NEGATIVE NEGATIVE Final   Staphylococcus aureus POSITIVE (A) NEGATIVE Final    Comment: (NOTE) The Xpert SA Assay (FDA approved for NASAL  specimens in patients 83 years of age and older), is one component of a comprehensive surveillance program. It is not intended to diagnose infection nor to guide or monitor treatment. Performed at [facility], [address] [place], [place] [address]          Radiology Studies: Dg Chest 1 View  Result Date: 01/01/2019 CLINICAL DATA:  Preop EXAM: CHEST  1 VIEW COMPARISON:  None. FINDINGS: Cardiomegaly, diffuse aortic atherosclerosis. Lungs clear. No effusions or edema. No acute bony abnormality. IMPRESSION: Cardiomegaly.  No active disease. Electronically Signed   By: Charlett NoseKevin  Dover M.D.   On: 01/01/2019 17:17   Ct Head Wo Contrast  Result Date: 01/01/2019 CLINICAL DATA:  Polytrauma, can call fall. EXAM: CT HEAD WITHOUT CONTRAST CT CERVICAL SPINE WITHOUT CONTRAST TECHNIQUE: Multidetector CT imaging of the head and cervical spine was performed following the standard protocol without intravenous contrast. Multiplanar CT image reconstructions of the cervical spine were also generated. COMPARISON:  None. FINDINGS: CT HEAD FINDINGS Brain: Patchy low-density throughout the deep white matter likely chronic small vessel disease. Mild age related volume loss.  No acute intracranial abnormality. Specifically, no hemorrhage, hydrocephalus, mass lesion, acute infarction, or significant intracranial injury. Vascular: No hyperdense vessel or unexpected calcification. Skull: No acute calvarial abnormality. Sinuses/Orbits: Visualized paranasal sinuses and mastoids clear. Orbital soft tissues unremarkable. Other: None CT CERVICAL SPINE FINDINGS Alignment: Normal alignment. Skull base and vertebrae: No acute fracture. No primary bone lesion or focal pathologic process. Soft tissues and spinal canal: No prevertebral fluid or swelling. No visible canal hematoma. Disc levels: Diffuse degenerative disc disease with disc space narrowing and spurring most pronounced from C4-5 through C6-7. Diffuse bilateral  degenerative facet disease. Upper chest: Aortic and great vessel atherosclerosis. No acute findings. Other: Carotid bulb calcifications.  No acute findings. IMPRESSION: Atrophy, chronic microvascular disease. No acute intracranial abnormality. Degenerative disc and facet disease in the cervical spine. No acute bony abnormality. Electronically Signed   By: Rolm Baptise M.D.   On: 01/01/2019 15:56   Ct Cervical Spine Wo Contrast  Result Date: 01/01/2019 CLINICAL DATA:  Polytrauma, can call fall. EXAM: CT HEAD WITHOUT CONTRAST CT CERVICAL SPINE WITHOUT CONTRAST TECHNIQUE: Multidetector CT imaging of the head and cervical spine was performed following the standard protocol without intravenous contrast. Multiplanar CT image reconstructions of the cervical spine were also generated. COMPARISON:  None. FINDINGS: CT HEAD FINDINGS Brain: Patchy low-density throughout the deep white matter likely chronic small vessel disease. Mild age related volume loss. No acute intracranial abnormality. Specifically, no hemorrhage, hydrocephalus, mass lesion, acute infarction, or significant intracranial injury. Vascular: No hyperdense vessel or unexpected calcification. Skull: No acute calvarial abnormality. Sinuses/Orbits: Visualized paranasal sinuses and mastoids clear. Orbital soft tissues unremarkable. Other: None CT CERVICAL SPINE FINDINGS Alignment: Normal alignment. Skull base and vertebrae: No acute fracture. No primary bone lesion or focal pathologic process. Soft tissues and spinal canal: No prevertebral fluid or swelling. No visible canal hematoma. Disc levels: Diffuse degenerative disc disease with disc space narrowing and spurring most pronounced from C4-5 through C6-7. Diffuse bilateral degenerative facet disease. Upper chest: Aortic and great vessel atherosclerosis. No acute findings. Other: Carotid bulb calcifications.  No acute findings. IMPRESSION: Atrophy, chronic microvascular disease. No acute intracranial  abnormality. Degenerative disc and facet disease in the cervical spine. No acute bony abnormality. Electronically Signed   By: Rolm Baptise M.D.   On: 01/01/2019 15:56   Chest Portable 1 View  Result Date: 01/01/2019 CLINICAL DATA:  Preoperative assessment for hip fracture EXAM: PORTABLE CHEST 1 VIEW COMPARISON:  January 01, 2019 study obtained earlier in the day FINDINGS: No evident edema or consolidation. Heart is borderline enlarged with pulmonary vascularity normal. No adenopathy. There is aortic atherosclerosis. Bones are osteoporotic. IMPRESSION: No edema or consolidation. Borderline cardiac enlargement. Aortic Atherosclerosis (ICD10-I70.0). Bones osteoporotic. Electronically Signed   By: Lowella Grip III M.D.   On: 01/01/2019 19:46   Dg Knee Right Port  Result Date: 01/02/2019 CLINICAL DATA:  Right hip fracture. EXAM: PORTABLE RIGHT KNEE - 1-2 VIEW COMPARISON:  None. FINDINGS: No fracture or dislocation is noted. Moderate narrowing of medial joint space is noted. Mild patellar spurring is noted. No joint effusion is noted. Vascular calcifications are noted. IMPRESSION: Moderate degenerative joint disease is noted medially. No acute abnormality seen in the right knee. Electronically Signed   By: Marijo Conception M.D.   On: 01/02/2019 08:58   Dg Hip Unilat With Pelvis 2-3 Views Right  Result Date: 01/01/2019 CLINICAL DATA:  Preop images.  Hip fracture. EXAM: DG HIP (WITH OR WITHOUT PELVIS) 2-3V RIGHT COMPARISON:  None. FINDINGS: Three  views of the pelvis and right proximal femur showing intertrochanteric fracture. Primary fracture components are displaced approximately 1 cm. There is varus angulation. There are separate greater lesser trochanteric fracture components. No other fractures. No bone lesions. Skeletal structures are demineralized. Hip joints, SI joints and symphysis pubis are normally aligned. IMPRESSION: Intertrochanteric fracture of the proximal right femur as described.  Electronically Signed   By: Amie Portland M.D.   On: 01/01/2019 17:18        Scheduled Meds:  amLODipine  2.5 mg Oral QHS   enoxaparin (LOVENOX) injection  30 mg Subcutaneous Q24H   gabapentin  300 mg Oral QHS   insulin aspart  0-15 Units Subcutaneous TID WC   levothyroxine  75 mcg Oral QAC breakfast   lisinopril  20 mg Oral q morning - 10a   propranolol  40 mg Oral Daily   And   propranolol  20 mg Oral Q24H   senna  1 tablet Oral BID   Continuous Infusions:  sodium chloride 10 mL/hr at 01/01/19 2159   sodium chloride     clindamycin (CLEOCIN) IV       LOS: 1 day    Time spent: 38 minutes spent on chart review, discussion with nursing staff, consultants, updating family and interview/physical exam; more than 50% of that time was spent in counseling and/or coordination of care.    Alvira Philips [place], DO [facility] Pager [phone]  If 7PM-7AM, please contact night-coverage [email or web address] Password [facility] 01/02/2019, 11:37 AM

## 2019-01-02 NOTE — Progress Notes (Signed)
Initiated soft B/L wrist restraints. Patient given bath, full linen change due incontinence. Patient refused lab draw. Continued to scream, yell verbal insults and swing and kick at staff. Able to calm somewhat with reassurance and orienting to room environment (pt thinks she is home).   Maintained NPO, bed exit alarm set, and call light within reach. Put tele monitor back on and updated tele tech for rate and rhythm check.

## 2019-01-02 NOTE — Anesthesia Preprocedure Evaluation (Addendum)
Anesthesia Evaluation  Patient identified by MRN, date of birth, ID band Patient awake    Reviewed: Allergy & Precautions, NPO status , Patient's Chart, lab work & pertinent test results  History of Anesthesia Complications Negative for: history of anesthetic complications  Airway Mallampati: II  TM Distance: >3 FB Neck ROM: Full    Dental  (+) Edentulous Upper, Dental Advisory Given, Poor Dentition, Missing   Pulmonary neg pulmonary ROS,    Pulmonary exam normal breath sounds clear to auscultation       Cardiovascular hypertension, Pt. on medications (-) anginanegative cardio ROS   Rhythm:Regular Rate:Normal  NSR  r 80   Neuro/Psych Dementia  Neuromuscular disease    GI/Hepatic negative GI ROS, Neg liver ROS,   Endo/Other  diabetes (glu 157), Type 2Hypothyroidism   Renal/GU negative Renal ROS     Musculoskeletal  (+) Arthritis ,   Abdominal   Peds  Hematology negative hematology ROS (+)   Anesthesia Other Findings   Reproductive/Obstetrics                            Anesthesia Physical Anesthesia Plan  ASA: III  Anesthesia Plan: General   Post-op Pain Management:    Induction: Intravenous  PONV Risk Score and Plan: 2 and Ondansetron, Dexamethasone and Treatment may vary due to age or medical condition  Airway Management Planned: Oral ETT  Additional Equipment:   Intra-op Plan:   Post-operative Plan: Extubation in OR  Informed Consent: I have reviewed the patients History and Physical, chart, labs and discussed the procedure including the risks, benefits and alternatives for the proposed anesthesia with the patient or authorized representative who has indicated his/her understanding and acceptance.   Patient has DNR.  Discussed DNR with power of attorney and Suspend DNR.   Dental advisory given, Consent reviewed with POA and History available from chart only  Plan  Discussed with: CRNA and Surgeon  Anesthesia Plan Comments: (Discussed with pt's son, Fritz Pickerel, by phone)      Anesthesia Quick Evaluation

## 2019-01-02 NOTE — Consult Note (Signed)
[facility] (OTS) Consult   Patient ID: Lori Ellis MRN: [id number] DOB/AGE: 1920/08/30 85 y.o.   Reason for Consult:right hip fracture Referring Physician: Illene Regulus, MD  HPI: Lori Ellis is an 85 y.o. female who lives in her own home with 24/ 7 care for assistance. She fell and sustained a right hip fracture in addition to a head laceration which has been repaired. Largely wheelchair dependent now with decreasing activity over the last year. C/o pain in right hip worse with movement, aching, allieved with stillness.  Past Medical History:  Diagnosis Date  . Degenerative disc disease, lumbar   . Diabetes mellitus without complication (HCC)   . Hypertension     Past Surgical History:  Procedure Laterality Date  . BLADDER SURGERY    . CHOLECYSTECTOMY      History reviewed. No pertinent family history.  Social History:  reports that she has never smoked. She has never used smokeless tobacco. She reports that she does not drink alcohol. No history on file for drug.  Allergies:  Allergies  Allergen Reactions  . Fentanyl Other (See Comments)    Severe delusions   . Penicillins Hives and Rash    Did it involve swelling of the face/tongue/throat, SOB, or low BP? Unk Did it involve sudden or severe rash/hives, skin peeling, or any reaction on the inside of your mouth or nose? Unk Did you need to seek medical attention at a hospital or doctor's office? Unk When did it last happen? "A long time ago" If all above answers are "NO", may proceed with cephalosporin use.   . Tetanus Toxoid Hives  . Adhesive [Tape] Other (See Comments)    TEARS THE SKIN!!  . Advil [Ibuprofen] Other (See Comments)    Was told by MD to not take  . Hydrochlorothiazide Other (See Comments)    Hyponatremia   . Naproxen Sodium Rash    Medications:  Prior to Admission:  Medications Prior to Admission  Medication Sig Dispense Refill Last Dose  . acetaminophen  (TYLENOL) 500 MG tablet Take 500-1,000 mg by mouth See admin instructions. Take 500 mg by mouth in the morning, 500 mg at 4:30 PM, and 1,000 mg at bedtime   01/01/2019 at am  . amLODipine (NORVASC) 2.5 MG tablet Take 2.5 mg by mouth at bedtime.   12/31/2018 at pm  . gabapentin (NEURONTIN) 300 MG capsule Take 300 mg by mouth at bedtime.   12/31/2018 at pm  . levothyroxine (SYNTHROID) 75 MCG tablet Take 75 mcg by mouth daily before breakfast.   01/01/2019 at am  . lisinopril (ZESTRIL) 20 MG tablet Take 20 mg by mouth every morning.    01/01/2019 at am  . Multiple Vitamins-Minerals (CENTRUM SILVER 50+WOMEN) TABS Take 1 tablet by mouth daily with lunch.   12/31/2018 at 1200  . Multiple Vitamins-Minerals (PRESERVISION AREDS 2) CAPS Take 1 capsule by mouth every evening.   12/31/2018 at pm  . propranolol (INDERAL) 20 MG tablet Take 20-40 mg by mouth See admin instructions. Take 40 mg by mouth in the morning and 20 mg at 4:30 PM   01/01/2019 at am  . traMADol (ULTRAM) 50 MG tablet Take 1 tablet (50 mg total) by mouth every 6 (six) hours as needed for moderate pain or severe pain. (Patient taking differently: Take 25-50 mg by mouth See admin instructions. Take 25 mg by mouth in the morning, 25 mg at 4:30 PM, 50 mg at bedtime, and  an additional 50 mg during the night (as needed for leg pain)) 28 tablet 0 01/01/2019 at am  . acetaminophen (TYLENOL) 325 MG tablet Take 2 tablets (650 mg total) by mouth every 6 (six) hours as needed for mild pain. (Patient not taking: Reported on 01/01/2019) 120 tablet 0 Not Taking at Unknown time  . Amino Acids-Protein Hydrolys (FEEDING SUPPLEMENT, PRO-STAT SUGAR FREE 64,) LIQD Take 30 mLs by mouth daily at 3 pm. (Patient not taking: Reported on 01/01/2019) 900 mL 0 Not Taking at Unknown time  . docusate sodium (COLACE) 100 MG capsule Take 1 capsule (100 mg total) by mouth every 12 (twelve) hours. (Patient not taking: Reported on 01/01/2019) 60 capsule 0 Not Taking at Unknown time  . ibuprofen  (ADVIL,MOTRIN) 400 MG tablet Take 1 tablet (400 mg total) by mouth 4 (four) times daily. (Patient not taking: Reported on 01/01/2019) 30 tablet 0 Not Taking at Unknown time  . lisinopril (PRINIVIL,ZESTRIL) 10 MG tablet Take 1 tablet (10 mg total) by mouth daily. (Patient not taking: Reported on 01/01/2019) 30 tablet 0 Not Taking at Unknown time  . ondansetron (ZOFRAN ODT) 4 MG disintegrating tablet Take 1 tablet (4 mg total) by mouth every 6 (six) hours as needed for nausea or vomiting. (Patient not taking: Reported on 01/01/2019) 20 tablet 0 Not Taking at Unknown time  . polyethylene glycol (MIRALAX / GLYCOLAX) packet Take 17 g by mouth daily. (Patient not taking: Reported on 01/01/2019) 14 each 0 Not Taking at Unknown time  . senna-docusate (SENOKOT-S) 8.6-50 MG tablet Take 1 tablet by mouth 2 (two) times daily. (Patient not taking: Reported on 01/01/2019) 60 tablet 0 Not Taking at Unknown time    Results for orders placed or performed during the hospital encounter of 01/01/19 (from the past 48 hour(s))  Basic metabolic panel     Status: Abnormal   Collection Time: 01/01/19  2:45 PM  Result Value Ref Range   Sodium 141 135 - 145 mmol/L   Potassium 4.0 3.5 - 5.1 mmol/L   Chloride 103 98 - 111 mmol/L   CO2 29 22 - 32 mmol/L   Glucose, Bld 166 (H) 70 - 99 mg/dL   BUN 14 8 - 23 mg/dL   Creatinine, Ser 1.611.06 (H) 0.44 - 1.00 mg/dL   Calcium 9.1 8.9 - 09.610.3 mg/dL   GFR calc non Af Amer 45 (L) >60 mL/min   GFR calc Af Amer 52 (L) >60 mL/min   Anion gap 9 5 - 15    Comment: Performed at [facility], [address] [place], [place] [address]  CBC WITH DIFFERENTIAL     Status: Abnormal   Collection Time: 01/01/19  2:45 PM  Result Value Ref Range   WBC 9.0 4.0 - 10.5 K/uL   RBC 3.84 (L) 3.87 - 5.11 MIL/uL   Hemoglobin 12.3 12.0 - 15.0 g/dL   HCT 98.138.9 19.136.0 - 47.846.0 %   MCV 101.3 (H) 80.0 - 100.0 fL   MCH 32.0 26.0 - 34.0 pg   MCHC 31.6 30.0 - 36.0 g/dL   RDW 29.512.1 62.111.5 - 30.815.5 %   Platelets 191  150 - 400 K/uL   nRBC 0.0 0.0 - 0.2 %   Neutrophils Relative % 64 %   Neutro Abs 5.7 1.7 - 7.7 K/uL   Lymphocytes Relative 23 %   Lymphs Abs 2.1 0.7 - 4.0 K/uL   Monocytes Relative 9 %   Monocytes Absolute 0.8 0.1 - 1.0 K/uL   Eosinophils  Relative 3 %   Eosinophils Absolute 0.3 0.0 - 0.5 K/uL   Basophils Relative 1 %   Basophils Absolute 0.1 0.0 - 0.1 K/uL   Immature Granulocytes 0 %   Abs Immature Granulocytes 0.04 0.00 - 0.07 K/uL    Comment: Performed at [facility], [address], [place], [place] [address]  Protime-INR     Status: None   Collection Time: 01/01/19  2:45 PM  Result Value Ref Range   Prothrombin Time 12.6 11.4 - 15.2 seconds   INR 1.0 0.8 - 1.2    Comment: (NOTE) INR goal varies based on device and disease states. Performed at [facility], [address], [place], [place] [address]   Type and screen [facility]     Status: None   Collection Time: 01/01/19  2:50 PM  Result Value Ref Range   ABO/RH(D) B POS    Antibody Screen NEG    Sample Expiration      01/04/2019,2359 Performed at [facility], [address], [place], [place] [address]   ABO/Rh     Status: None   Collection Time: 01/01/19  2:50 PM  Result Value Ref Range   ABO/RH(D)      B POS Performed at [facility], [address] [place], [place] [address]   CBG monitoring, ED     Status: Abnormal   Collection Time: 01/01/19  5:20 PM  Result Value Ref Range   Glucose-Capillary 125 (H) 70 - 99 mg/dL  SARS Coronavirus 2 (CEPHEID - Performed in [facility] hospital lab), Hosp Order     Status: None   Collection Time: 01/01/19  7:01 PM   Specimen: Nasopharyngeal Swab  Result Value Ref Range   SARS Coronavirus 2 NEGATIVE NEGATIVE    Comment: (NOTE) If result is NEGATIVE SARS-CoV-2 target nucleic acids are NOT DETECTED. The SARS-CoV-2 RNA is generally detectable in upper and lower  respiratory specimens during the acute phase of infection. The  lowest  concentration of SARS-CoV-2 viral copies this assay can detect is 250  copies / mL. A negative result does not preclude SARS-CoV-2 infection  and should not be used as the sole basis for treatment or other  patient management decisions.  A negative result may occur with  improper specimen collection / handling, submission of specimen other  than nasopharyngeal swab, presence of viral mutation(s) within the  areas targeted by this assay, and inadequate number of viral copies  (<250 copies / mL). A negative result must be combined with clinical  observations, patient history, and epidemiological information. If result is POSITIVE SARS-CoV-2 target nucleic acids are DETECTED. The SARS-CoV-2 RNA is generally detectable in upper and lower  respiratory specimens dur ing the acute phase of infection.  Positive  results are indicative of active infection with SARS-CoV-2.  Clinical  correlation with patient history and other diagnostic information is  necessary to determine patient infection status.  Positive results do  not rule out bacterial infection or co-infection with other viruses. If result is PRESUMPTIVE POSTIVE SARS-CoV-2 nucleic acids MAY BE PRESENT.   A presumptive positive result was obtained on the submitted specimen  and confirmed on repeat testing.  While 2019 novel coronavirus  (SARS-CoV-2) nucleic acids may be present in the submitted sample  additional confirmatory testing may be necessary for epidemiological  and / or clinical management purposes  to differentiate between  SARS-CoV-2 and other Sarbecovirus currently known to infect humans.  If clinically indicated additional testing with an alternate test  methodology [phone]([id number]) is advised. The SARS-CoV-2 RNA is generally  detectable in upper and lower respiratory sp ecimens during the acute  phase of infection. The expected result is Negative. Fact Sheet for Patients:  BoilerBrush.com.cyhttps://www.fda.gov/media/136312/download  Fact Sheet for Healthcare Providers: https://pope.com/https://www.fda.gov/media/136313/download This test is not yet approved or cleared by the [place] FDA and has been authorized for detection and/or diagnosis of SARS-CoV-2 by FDA under an Emergency Use Authorization (EUA).  This EUA will remain in effect (meaning this test can be used) for the duration of the COVID-19 declaration under Section 564(b)(1) of the Act, 21 U.S.C. section 360bbb-3(b)(1), unless the authorization is terminated or revoked sooner. Performed at [facility], [address] [place], [place] [address]   Hemoglobin A1c     Status: Abnormal   Collection Time: 01/01/19  7:41 PM  Result Value Ref Range   Hgb A1c MFr Bld 5.9 (H) 4.8 - 5.6 %    Comment: (NOTE) Pre diabetes:          5.7%-6.4% Diabetes:              >6.4% Glycemic control for   <7.0% adults with diabetes    Mean Plasma Glucose 122.63 mg/dL    Comment: Performed at [facility], [address] [place], [place] [address]  Surgical pcr screen     Status: Abnormal   Collection Time: 01/01/19  9:17 PM   Specimen: Nasal Mucosa; Nasal Swab  Result Value Ref Range   MRSA, PCR NEGATIVE NEGATIVE   Staphylococcus aureus POSITIVE (A) NEGATIVE    Comment: (NOTE) The Xpert SA Assay (FDA approved for NASAL specimens in patients 83 years of age and older), is one component of a comprehensive surveillance program. It is not intended to diagnose infection nor to guide or monitor treatment. Performed at [facility], [address] [place], [place] [address]   Glucose, capillary     Status: Abnormal   Collection Time: 01/02/19  7:49 AM  Result Value Ref Range   Glucose-Capillary 126 (H) 70 - 99 mg/dL  CBC     Status: Abnormal   Collection Time: 01/02/19  8:59 AM  Result Value Ref Range   WBC 12.8 (H) 4.0 - 10.5 K/uL   RBC 3.91 3.87 - 5.11 MIL/uL   Hemoglobin 12.7 12.0 - 15.0 g/dL   HCT 30.838.2 65.736.0 - 84.646.0 %   MCV 97.7 80.0 - 100.0 fL   MCH 32.5  26.0 - 34.0 pg   MCHC 33.2 30.0 - 36.0 g/dL   RDW 96.212.2 95.211.5 - 84.115.5 %   Platelets 196 150 - 400 K/uL   nRBC 0.0 0.0 - 0.2 %    Comment: Performed at [facility], [address] [place], [place] [address]  Protime-INR     Status: None   Collection Time: 01/02/19  8:59 AM  Result Value Ref Range   Prothrombin Time 13.2 11.4 - 15.2 seconds   INR 1.0 0.8 - 1.2    Comment: (NOTE) INR goal varies based on device and disease states. Performed at [facility], [address] [place], [place] [address]   Basic metabolic panel     Status: Abnormal   Collection Time: 01/02/19  8:59 AM  Result Value Ref Range   Sodium 142 135 - 145 mmol/L   Potassium 3.4 (L) 3.5 - 5.1 mmol/L   Chloride 101 98 - 111 mmol/L  CO2 26 22 - 32 mmol/L   Glucose, Bld 160 (H) 70 - 99 mg/dL   BUN 16 8 - 23 mg/dL   Creatinine, Ser 1.611.08 (H) 0.44 - 1.00 mg/dL   Calcium 9.4 8.9 - 09.610.3 mg/dL   GFR calc non Af Amer 44 (L) >60 mL/min   GFR calc Af Amer 51 (L) >60 mL/min   Anion gap 15 5 - 15    Comment: Performed at [facility], [address] [place], [place] [address]  Glucose, capillary     Status: Abnormal   Collection Time: 01/02/19 11:06 AM  Result Value Ref Range   Glucose-Capillary 157 (H) 70 - 99 mg/dL    Dg Chest 1 View  Result Date: 01/01/2019 CLINICAL DATA:  Preop EXAM: CHEST  1 VIEW COMPARISON:  None. FINDINGS: Cardiomegaly, diffuse aortic atherosclerosis. Lungs clear. No effusions or edema. No acute bony abnormality. IMPRESSION: Cardiomegaly.  No active disease. Electronically Signed   By: Charlett NoseKevin  Dover M.D.   On: 01/01/2019 17:17   Ct Head Wo Contrast  Result Date: 01/01/2019 CLINICAL DATA:  Polytrauma, can call fall. EXAM: CT HEAD WITHOUT CONTRAST CT CERVICAL SPINE WITHOUT CONTRAST TECHNIQUE: Multidetector CT imaging of the head and cervical spine was performed following the standard protocol without intravenous contrast. Multiplanar CT image reconstructions of the cervical spine  were also generated. COMPARISON:  None. FINDINGS: CT HEAD FINDINGS Brain: Patchy low-density throughout the deep white matter likely chronic small vessel disease. Mild age related volume loss. No acute intracranial abnormality. Specifically, no hemorrhage, hydrocephalus, mass lesion, acute infarction, or significant intracranial injury. Vascular: No hyperdense vessel or unexpected calcification. Skull: No acute calvarial abnormality. Sinuses/Orbits: Visualized paranasal sinuses and mastoids clear. Orbital soft tissues unremarkable. Other: None CT CERVICAL SPINE FINDINGS Alignment: Normal alignment. Skull base and vertebrae: No acute fracture. No primary bone lesion or focal pathologic process. Soft tissues and spinal canal: No prevertebral fluid or swelling. No visible canal hematoma. Disc levels: Diffuse degenerative disc disease with disc space narrowing and spurring most pronounced from C4-5 through C6-7. Diffuse bilateral degenerative facet disease. Upper chest: Aortic and great vessel atherosclerosis. No acute findings. Other: Carotid bulb calcifications.  No acute findings. IMPRESSION: Atrophy, chronic microvascular disease. No acute intracranial abnormality. Degenerative disc and facet disease in the cervical spine. No acute bony abnormality. Electronically Signed   By: Charlett NoseKevin  Dover M.D.   On: 01/01/2019 15:56   Ct Cervical Spine Wo Contrast  Result Date: 01/01/2019 CLINICAL DATA:  Polytrauma, can call fall. EXAM: CT HEAD WITHOUT CONTRAST CT CERVICAL SPINE WITHOUT CONTRAST TECHNIQUE: Multidetector CT imaging of the head and cervical spine was performed following the standard protocol without intravenous contrast. Multiplanar CT image reconstructions of the cervical spine were also generated. COMPARISON:  None. FINDINGS: CT HEAD FINDINGS Brain: Patchy low-density throughout the deep white matter likely chronic small vessel disease. Mild age related volume loss. No acute intracranial abnormality.  Specifically, no hemorrhage, hydrocephalus, mass lesion, acute infarction, or significant intracranial injury. Vascular: No hyperdense vessel or unexpected calcification. Skull: No acute calvarial abnormality. Sinuses/Orbits: Visualized paranasal sinuses and mastoids clear. Orbital soft tissues unremarkable. Other: None CT CERVICAL SPINE FINDINGS Alignment: Normal alignment. Skull base and vertebrae: No acute fracture. No primary bone lesion or focal pathologic process. Soft tissues and spinal canal: No prevertebral fluid or swelling. No visible canal hematoma. Disc levels: Diffuse degenerative disc disease with disc space narrowing and spurring most pronounced from C4-5 through C6-7. Diffuse bilateral degenerative facet disease. Upper chest: Aortic  and great vessel atherosclerosis. No acute findings. Other: Carotid bulb calcifications.  No acute findings. IMPRESSION: Atrophy, chronic microvascular disease. No acute intracranial abnormality. Degenerative disc and facet disease in the cervical spine. No acute bony abnormality. Electronically Signed   By: Charlett Nose M.D.   On: 01/01/2019 15:56   Chest Portable 1 View  Result Date: 01/01/2019 CLINICAL DATA:  Preoperative assessment for hip fracture EXAM: PORTABLE CHEST 1 VIEW COMPARISON:  January 01, 2019 study obtained earlier in the day FINDINGS: No evident edema or consolidation. Heart is borderline enlarged with pulmonary vascularity normal. No adenopathy. There is aortic atherosclerosis. Bones are osteoporotic. IMPRESSION: No edema or consolidation. Borderline cardiac enlargement. Aortic Atherosclerosis (ICD10-I70.0). Bones osteoporotic. Electronically Signed   By: Bretta Bang III M.D.   On: 01/01/2019 19:46   Dg Knee Right Port  Result Date: 01/02/2019 CLINICAL DATA:  Right hip fracture. EXAM: PORTABLE RIGHT KNEE - 1-2 VIEW COMPARISON:  None. FINDINGS: No fracture or dislocation is noted. Moderate narrowing of medial joint space is noted. Mild  patellar spurring is noted. No joint effusion is noted. Vascular calcifications are noted. IMPRESSION: Moderate degenerative joint disease is noted medially. No acute abnormality seen in the right knee. Electronically Signed   By: Lupita Raider M.D.   On: 01/02/2019 08:58   Dg Hip Unilat With Pelvis 2-3 Views Right  Result Date: 01/01/2019 CLINICAL DATA:  Preop images.  Hip fracture. EXAM: DG HIP (WITH OR WITHOUT PELVIS) 2-3V RIGHT COMPARISON:  None. FINDINGS: Three views of the pelvis and right proximal femur showing intertrochanteric fracture. Primary fracture components are displaced approximately 1 cm. There is varus angulation. There are separate greater lesser trochanteric fracture components. No other fractures. No bone lesions. Skeletal structures are demineralized. Hip joints, SI joints and symphysis pubis are normally aligned. IMPRESSION: Intertrochanteric fracture of the proximal right femur as described. Electronically Signed   By: Amie Portland M.D.   On: 01/01/2019 17:18    ROS No recent fever, bleeding abnormalities, urologic dysfunction, GI problems, or weight gain. Blood pressure 134/64, pulse 80, temperature 99.1 F (37.3 C), temperature source Oral, resp. rate 16, height  (1.6 m), weight 54.4 kg, SpO2 98 %. Physical Exam Slight blood in hair [place] No audible wheezing or retractions with breathing RLE No traumatic wounds, ecchymosis, or rash  Tender right hip  No knee or ankle effusion  Knee stable to varus/ valgus and anterior/posterior stress  Sens DPN, SPN, TN intact  Motor EHL, ext, flex, evers intact grossly  CR 2 sec and warm but pulses not palpable, No edema         Assessment/Plan:  Right intertroch fracture  The risks and benefits of surgery were discussed with her son, [name], including the possibility of infection, nerve injury, vessel injury, wound breakdown, arthritis, symptomatic hardware, DVT/ PE, loss of motion, malunion, nonunion, and need for further  surgery among others.  Dr. Jairo Ben also discussed anesthesia risks specifically. He acknowledged these risks and wished to proceed.  Weightbearing: WBAT RLE Insicional and dressing care: OK to remove dressings two days after discharge and leave open to air with dry gauze PRN Orthopedic device(s): None new Showering: absolutely fine VTE prophylaxis: Lovenox  qd 10 days Pain control: Tramadol post discharge Follow - up plan: 3 wks Contact information:  Myrene Galas MD, Montez Morita PA   Myrene Galas, MD [facility], [facility] [phone]  01/02/2019, 11:51 AM  [facility] [address] [place] [address] [phone] (  O) [phone] (F)

## 2019-01-02 NOTE — Anesthesia Postprocedure Evaluation (Signed)
Anesthesia Post Note  Patient: Lori Ellis  Procedure(s) Performed: INTRAMEDULLARY (IM) NAIL INTERTROCHANTRIC right hip (Right Hip)     Patient location during evaluation: PACU Anesthesia Type: General Level of consciousness: awake and alert and patient cooperative (mental status similar to pre-op status) Pain management: pain level controlled Vital Signs Assessment: post-procedure vital signs reviewed and stable Respiratory status: spontaneous breathing, nonlabored ventilation, respiratory function stable and patient connected to nasal cannula oxygen Cardiovascular status: blood pressure returned to baseline and stable Postop Assessment: no apparent nausea or vomiting Anesthetic complications: no    Last Vitals:  Vitals:   01/02/19 1504 01/02/19 1506  BP: (!) 144/125 137/67  Pulse:  69  Resp: 17 (!) 25  Temp:    SpO2:  93%    Last Pain:  Vitals:   01/02/19 0358  TempSrc: Oral  PainSc:                  Jakobi Thetford,E. Trevonn Hallum

## 2019-01-02 NOTE — Anesthesia Procedure Notes (Signed)
Procedure Name: Intubation Date/Time: 01/02/2019 12:21 PM Performed by: Wilburn Cornelia, CRNA Pre-anesthesia Checklist: Patient identified, Emergency Drugs available, Suction available, Patient being monitored and Timeout performed Patient Re-evaluated:Patient Re-evaluated prior to induction Oxygen Delivery Method: Circle system utilized Preoxygenation: Pre-oxygenation with 100% oxygen Induction Type: IV induction Ventilation: Mask ventilation without difficulty Laryngoscope Size: Mac and 3 Grade View: Grade I Tube type: Oral Tube size: 7.0 mm Number of attempts: 1 Airway Equipment and Method: Stylet Placement Confirmation: ETT inserted through vocal cords under direct vision,  positive ETCO2,  CO2 detector and breath sounds checked- equal and bilateral Secured at: 21 cm Tube secured with: Tape Dental Injury: Teeth and Oropharynx as per pre-operative assessment

## 2019-01-02 NOTE — Plan of Care (Signed)
  Problem: Pain Managment: Goal: General experience of comfort will improve Outcome: Progressing   Problem: Safety: Goal: Ability to remain free from injury will improve Outcome: Progressing   

## 2019-01-02 NOTE — Consult Note (Signed)
Orthopaedic Trauma Service (OTS) Consult   Patient ID: Lori Ellis MRN: [id number] DOB/AGE: [birth year] 85 y.o.   Reason for Consult: Right hip fracture Referring Physician: Illene Regulus, MD (hospitalist)   HPI: Lori Ellis is an 85 y.o. white female with history of diabetes, degenerative disc disease, osteoarthritis and hypertension who sustained a ground-level fall with resultant right hip fracture.  Per son patient is moderately ambulatory.  She does use a wheelchair that she treat as an ambulatory device by pushing it for balance.  But she has become progressively more dependent on it over the last several years.  She does live alone but does have a caregiver 24/7.   Discussed with patient's son [name] plans for surgery and expectations.  [name] anticipates that his mom will not want to move to a nursing home although she did have a good experience at [facility] last year which could potentially be an option.   Patient was seen and evaluated in surgical short stay.  She is in soft restraints as she has been calling out her IVs as well as the pure wick.  She did remove 2 IVs overnight.  She does appear to be somewhat comfortable.  She does report some mild pain in her right hip.  No other complaints or concerns.   Imaging of her right hip demonstrates a four-part intertrochanteric proximal femur fracture.  2 views of her right knee does not show any acute fracture.  There is tricompartmental osteoarthritis present.  Calcific vascular disease also noted  COVID 19 test is negative   Past Medical History:  Diagnosis Date  . Degenerative disc disease, lumbar   . Diabetes mellitus without complication (HCC)   . Hypertension     Past Surgical History:  Procedure Laterality Date  . BLADDER SURGERY    . CHOLECYSTECTOMY      History reviewed. No pertinent family history.  Social History:  reports that she has never smoked. She has never used smokeless tobacco. She  reports that she does not drink alcohol. No history on file for drug.  Allergies:  Allergies  Allergen Reactions  . Fentanyl Other (See Comments)    Severe delusions   . Penicillins Hives and Rash    Did it involve swelling of the face/tongue/throat, SOB, or low BP? Unk Did it involve sudden or severe rash/hives, skin peeling, or any reaction on the inside of your mouth or nose? Unk Did you need to seek medical attention at a hospital or doctor's office? Unk When did it last happen? "A long time ago" If all above answers are "NO", may proceed with cephalosporin use.   . Tetanus Toxoid Hives  . Adhesive [Tape] Other (See Comments)    TEARS THE SKIN!!  . Advil [Ibuprofen] Other (See Comments)    Was told by MD to not take  . Hydrochlorothiazide Other (See Comments)    Hyponatremia   . Naproxen Sodium Rash    Medications: I have reviewed the patient's current medications.  Current Meds  Medication Sig  . acetaminophen (TYLENOL) 500 MG tablet Take 500-1,000 mg by mouth See admin instructions. Take 500 mg by mouth in the morning, 500 mg at 4:30 PM, and 1,000 mg at bedtime  . amLODipine (NORVASC) 2.5 MG tablet Take 2.5 mg by mouth at bedtime.  . gabapentin (NEURONTIN) 300 MG capsule Take 300 mg by mouth at bedtime.  Marland Kitchen levothyroxine (SYNTHROID) 75 MCG tablet Take 75 mcg by mouth daily before  breakfast.  . lisinopril (ZESTRIL) 20 MG tablet Take 20 mg by mouth every morning.   . Multiple Vitamins-Minerals (CENTRUM SILVER 50+WOMEN) TABS Take 1 tablet by mouth daily with lunch.  . Multiple Vitamins-Minerals (PRESERVISION AREDS 2) CAPS Take 1 capsule by mouth every evening.  . propranolol (INDERAL) 20 MG tablet Take 20-40 mg by mouth See admin instructions. Take 40 mg by mouth in the morning and 20 mg at 4:30 PM  . traMADol (ULTRAM) 50 MG tablet Take 1 tablet (50 mg total) by mouth every 6 (six) hours as needed for moderate pain or severe pain. (Patient taking differently: Take 25-50 mg by  mouth See admin instructions. Take 25 mg by mouth in the morning, 25 mg at 4:30 PM, 50 mg at bedtime, and an additional 50 mg during the night (as needed for leg pain))     Results for orders placed or performed during the hospital encounter of 01/01/19 (from the past 48 hour(s))  Basic metabolic panel     Status: Abnormal   Collection Time: 01/01/19  2:45 PM  Result Value Ref Range   Sodium 141 135 - 145 mmol/L   Potassium 4.0 3.5 - 5.1 mmol/L   Chloride 103 98 - 111 mmol/L   CO2 29 22 - 32 mmol/L   Glucose, Bld 166 (H) 70 - 99 mg/dL   BUN 14 8 - 23 mg/dL   Creatinine, Ser 0.981.06 (H) 0.44 - 1.00 mg/dL   Calcium 9.1 8.9 - 11.910.3 mg/dL   GFR calc non Af Amer 45 (L) >60 mL/min   GFR calc Af Amer 52 (L) >60 mL/min   Anion gap 9 5 - 15    Comment: Performed at [facility], [address] [place], [place] [address]  CBC WITH DIFFERENTIAL     Status: Abnormal   Collection Time: 01/01/19  2:45 PM  Result Value Ref Range   WBC 9.0 4.0 - 10.5 K/uL   RBC 3.84 (L) 3.87 - 5.11 MIL/uL   Hemoglobin 12.3 12.0 - 15.0 g/dL   HCT 95.638.9 21.336.0 - 08.646.0 %   MCV 101.3 (H) 80.0 - 100.0 fL   MCH 32.0 26.0 - 34.0 pg   MCHC 31.6 30.0 - 36.0 g/dL   RDW 57.812.1 46.911.5 - 62.915.5 %   Platelets 191 150 - 400 K/uL   nRBC 0.0 0.0 - 0.2 %   Neutrophils Relative % 64 %   Neutro Abs 5.7 1.7 - 7.7 K/uL   Lymphocytes Relative 23 %   Lymphs Abs 2.1 0.7 - 4.0 K/uL   Monocytes Relative 9 %   Monocytes Absolute 0.8 0.1 - 1.0 K/uL   Eosinophils Relative 3 %   Eosinophils Absolute 0.3 0.0 - 0.5 K/uL   Basophils Relative 1 %   Basophils Absolute 0.1 0.0 - 0.1 K/uL   Immature Granulocytes 0 %   Abs Immature Granulocytes 0.04 0.00 - 0.07 K/uL    Comment: Performed at [facility], [address] [place], [place] [address]  Protime-INR     Status: None   Collection Time: 01/01/19  2:45 PM  Result Value Ref Range   Prothrombin Time 12.6 11.4 - 15.2 seconds   INR 1.0 0.8 - 1.2    Comment: (NOTE) INR goal varies  based on device and disease states. Performed at [facility], [address] [place], [place] [address]   Type and screen [facility]     Status: None   Collection  Time: 01/01/19  2:50 PM  Result Value Ref Range   ABO/RH(D) B POS    Antibody Screen NEG    Sample Expiration      01/04/2019,2359 Performed at [facility], [address], [place], [place] [address]   ABO/Rh     Status: None   Collection Time: 01/01/19  2:50 PM  Result Value Ref Range   ABO/RH(D)      B POS Performed at [facility], [address] [place], [place] [address]   CBG monitoring, ED     Status: Abnormal   Collection Time: 01/01/19  5:20 PM  Result Value Ref Range   Glucose-Capillary 125 (H) 70 - 99 mg/dL  SARS Coronavirus 2 (CEPHEID - Performed in [facility] hospital lab), Hosp Order     Status: None   Collection Time: 01/01/19  7:01 PM   Specimen: Nasopharyngeal Swab  Result Value Ref Range   SARS Coronavirus 2 NEGATIVE NEGATIVE    Comment: (NOTE) If result is NEGATIVE SARS-CoV-2 target nucleic acids are NOT DETECTED. The SARS-CoV-2 RNA is generally detectable in upper and lower  respiratory specimens during the acute phase of infection. The lowest  concentration of SARS-CoV-2 viral copies this assay can detect is 250  copies / mL. A negative result does not preclude SARS-CoV-2 infection  and should not be used as the sole basis for treatment or other  patient management decisions.  A negative result may occur with  improper specimen collection / handling, submission of specimen other  than nasopharyngeal swab, presence of viral mutation(s) within the  areas targeted by this assay, and inadequate number of viral copies  (<250 copies / mL). A negative result must be combined with clinical  observations, patient history, and epidemiological information. If result is POSITIVE SARS-CoV-2 target nucleic acids are DETECTED. The SARS-CoV-2 RNA is generally  detectable in upper and lower  respiratory specimens dur ing the acute phase of infection.  Positive  results are indicative of active infection with SARS-CoV-2.  Clinical  correlation with patient history and other diagnostic information is  necessary to determine patient infection status.  Positive results do  not rule out bacterial infection or co-infection with other viruses. If result is PRESUMPTIVE POSTIVE SARS-CoV-2 nucleic acids MAY BE PRESENT.   A presumptive positive result was obtained on the submitted specimen  and confirmed on repeat testing.  While 2019 novel coronavirus  (SARS-CoV-2) nucleic acids may be present in the submitted sample  additional confirmatory testing may be necessary for epidemiological  and / or clinical management purposes  to differentiate between  SARS-CoV-2 and other Sarbecovirus currently known to infect humans.  If clinically indicated additional testing with an alternate test  methodology [phone]) is advised. The SARS-CoV-2 RNA is generally  detectable in upper and lower respiratory sp ecimens during the acute  phase of infection. The expected result is Negative. Fact Sheet for Patients:  BoilerBrush.com.cy Fact Sheet for Healthcare Providers: https://pope.com/ This test is not yet approved or cleared by the [place] FDA and has been authorized for detection and/or diagnosis of SARS-CoV-2 by FDA under an Emergency Use Authorization (EUA).  This EUA will remain in effect (meaning this test can be used) for the duration of the COVID-19 declaration under Section 564(b)(1) of the Act, 21 U.S.C. section 360bbb-3(b)(1), unless the authorization is terminated or revoked sooner. Performed at [facility], [address], [place], [place] [address]   Hemoglobin A1c  Status: Abnormal   Collection Time: 01/01/19  7:41 PM  Result Value Ref Range   Hgb A1c MFr Bld 5.9 (H) 4.8 - 5.6 %     Comment: (NOTE) Pre diabetes:          5.7%-6.4% Diabetes:              >6.4% Glycemic control for   <7.0% adults with diabetes    Mean Plasma Glucose 122.63 mg/dL    Comment: Performed at [facility], [address] [place], [place] [address]  Surgical pcr screen     Status: Abnormal   Collection Time: 01/01/19  9:17 PM   Specimen: Nasal Mucosa; Nasal Swab  Result Value Ref Range   MRSA, PCR NEGATIVE NEGATIVE   Staphylococcus aureus POSITIVE (A) NEGATIVE    Comment: (NOTE) The Xpert SA Assay (FDA approved for NASAL specimens in patients 59 years of age and older), is one component of a comprehensive surveillance program. It is not intended to diagnose infection nor to guide or monitor treatment. Performed at [facility], [address] [place], [place] [address]   Glucose, capillary     Status: Abnormal   Collection Time: 01/02/19  7:49 AM  Result Value Ref Range   Glucose-Capillary 126 (H) 70 - 99 mg/dL  CBC     Status: Abnormal   Collection Time: 01/02/19  8:59 AM  Result Value Ref Range   WBC 12.8 (H) 4.0 - 10.5 K/uL   RBC 3.91 3.87 - 5.11 MIL/uL   Hemoglobin 12.7 12.0 - 15.0 g/dL   HCT 09.8 11.9 - 14.7 %   MCV 97.7 80.0 - 100.0 fL   MCH 32.5 26.0 - 34.0 pg   MCHC 33.2 30.0 - 36.0 g/dL   RDW 82.9 56.2 - 13.0 %   Platelets 196 150 - 400 K/uL   nRBC 0.0 0.0 - 0.2 %    Comment: Performed at [facility], [address], [place], [place] [address]  Protime-INR     Status: None   Collection Time: 01/02/19  8:59 AM  Result Value Ref Range   Prothrombin Time 13.2 11.4 - 15.2 seconds   INR 1.0 0.8 - 1.2    Comment: (NOTE) INR goal varies based on device and disease states. Performed at [facility], [address] [place], [place] [address]   Basic metabolic panel     Status: Abnormal   Collection Time: 01/02/19  8:59 AM  Result Value Ref Range   Sodium 142 135 - 145 mmol/L   Potassium 3.4 (L) 3.5 - 5.1 mmol/L   Chloride 101 98 - 111  mmol/L   CO2 26 22 - 32 mmol/L   Glucose, Bld 160 (H) 70 - 99 mg/dL   BUN 16 8 - 23 mg/dL   Creatinine, Ser 9.52 (H) 0.44 - 1.00 mg/dL   Calcium 9.4 8.9 - 84.1 mg/dL   GFR calc non Af Amer 44 (L) >60 mL/min   GFR calc Af Amer 51 (L) >60 mL/min   Anion gap 15 5 - 15    Comment: Performed at [facility], [address] [place], [place] [address]  Glucose, capillary     Status: Abnormal   Collection Time: 01/02/19 11:06 AM  Result Value Ref Range   Glucose-Capillary 157 (H) 70 - 99 mg/dL    Dg Chest 1 View  Result Date: 01/01/2019 CLINICAL DATA:  Preop EXAM: CHEST  1 VIEW COMPARISON:  None. FINDINGS: Cardiomegaly, diffuse aortic atherosclerosis. Lungs clear. No effusions or edema. No acute bony abnormality. IMPRESSION: Cardiomegaly.  No active disease. Electronically Signed   By: Charlett NoseKevin  Dover M.D.   On: 01/01/2019 17:17   Ct Head Wo Contrast  Result Date: 01/01/2019 CLINICAL DATA:  Polytrauma, can call fall. EXAM: CT HEAD WITHOUT CONTRAST CT CERVICAL SPINE WITHOUT CONTRAST TECHNIQUE: Multidetector CT imaging of the head and cervical spine was performed following the standard protocol without intravenous contrast. Multiplanar CT image reconstructions of the cervical spine were also generated. COMPARISON:  None. FINDINGS: CT HEAD FINDINGS Brain: Patchy low-density throughout the deep white matter likely chronic small vessel disease. Mild age related volume loss. No acute intracranial abnormality. Specifically, no hemorrhage, hydrocephalus, mass lesion, acute infarction, or significant intracranial injury. Vascular: No hyperdense vessel or unexpected calcification. Skull: No acute calvarial abnormality. Sinuses/Orbits: Visualized paranasal sinuses and mastoids clear. Orbital soft tissues unremarkable. Other: None CT CERVICAL SPINE FINDINGS Alignment: Normal alignment. Skull base and vertebrae: No acute fracture. No primary bone lesion or focal pathologic process. Soft tissues and spinal  canal: No prevertebral fluid or swelling. No visible canal hematoma. Disc levels: Diffuse degenerative disc disease with disc space narrowing and spurring most pronounced from C4-5 through C6-7. Diffuse bilateral degenerative facet disease. Upper chest: Aortic and great vessel atherosclerosis. No acute findings. Other: Carotid bulb calcifications.  No acute findings. IMPRESSION: Atrophy, chronic microvascular disease. No acute intracranial abnormality. Degenerative disc and facet disease in the cervical spine. No acute bony abnormality. Electronically Signed   By: Charlett NoseKevin  Dover M.D.   On: 01/01/2019 15:56   Ct Cervical Spine Wo Contrast  Result Date: 01/01/2019 CLINICAL DATA:  Polytrauma, can call fall. EXAM: CT HEAD WITHOUT CONTRAST CT CERVICAL SPINE WITHOUT CONTRAST TECHNIQUE: Multidetector CT imaging of the head and cervical spine was performed following the standard protocol without intravenous contrast. Multiplanar CT image reconstructions of the cervical spine were also generated. COMPARISON:  None. FINDINGS: CT HEAD FINDINGS Brain: Patchy low-density throughout the deep white matter likely chronic small vessel disease. Mild age related volume loss. No acute intracranial abnormality. Specifically, no hemorrhage, hydrocephalus, mass lesion, acute infarction, or significant intracranial injury. Vascular: No hyperdense vessel or unexpected calcification. Skull: No acute calvarial abnormality. Sinuses/Orbits: Visualized paranasal sinuses and mastoids clear. Orbital soft tissues unremarkable. Other: None CT CERVICAL SPINE FINDINGS Alignment: Normal alignment. Skull base and vertebrae: No acute fracture. No primary bone lesion or focal pathologic process. Soft tissues and spinal canal: No prevertebral fluid or swelling. No visible canal hematoma. Disc levels: Diffuse degenerative disc disease with disc space narrowing and spurring most pronounced from C4-5 through C6-7. Diffuse bilateral degenerative facet  disease. Upper chest: Aortic and great vessel atherosclerosis. No acute findings. Other: Carotid bulb calcifications.  No acute findings. IMPRESSION: Atrophy, chronic microvascular disease. No acute intracranial abnormality. Degenerative disc and facet disease in the cervical spine. No acute bony abnormality. Electronically Signed   By: Charlett NoseKevin  Dover M.D.   On: 01/01/2019 15:56   Chest Portable 1 View  Result Date: 01/01/2019 CLINICAL DATA:  Preoperative assessment for hip fracture EXAM: PORTABLE CHEST 1 VIEW COMPARISON:  January 01, 2019 study obtained earlier in the day FINDINGS: No evident edema or consolidation. Heart is borderline enlarged with pulmonary vascularity normal. No adenopathy. There is aortic atherosclerosis. Bones are osteoporotic. IMPRESSION: No edema or consolidation. Borderline cardiac enlargement. Aortic Atherosclerosis (ICD10-I70.0). Bones osteoporotic. Electronically Signed   By: Bretta BangWilliam  Woodruff III M.D.   On: 01/01/2019 19:46   Dg Knee  Right Port  Result Date: 01/02/2019 CLINICAL DATA:  Right hip fracture. EXAM: PORTABLE RIGHT KNEE - 1-2 VIEW COMPARISON:  None. FINDINGS: No fracture or dislocation is noted. Moderate narrowing of medial joint space is noted. Mild patellar spurring is noted. No joint effusion is noted. Vascular calcifications are noted. IMPRESSION: Moderate degenerative joint disease is noted medially. No acute abnormality seen in the right knee. Electronically Signed   By: Lupita RaiderJames  Green Jr M.D.   On: 01/02/2019 08:58   Dg Hip Unilat With Pelvis 2-3 Views Right  Result Date: 01/01/2019 CLINICAL DATA:  Preop images.  Hip fracture. EXAM: DG HIP (WITH OR WITHOUT PELVIS) 2-3V RIGHT COMPARISON:  None. FINDINGS: Three views of the pelvis and right proximal femur showing intertrochanteric fracture. Primary fracture components are displaced approximately 1 cm. There is varus angulation. There are separate greater lesser trochanteric fracture components. No other fractures. No  bone lesions. Skeletal structures are demineralized. Hip joints, SI joints and symphysis pubis are normally aligned. IMPRESSION: Intertrochanteric fracture of the proximal right femur as described. Electronically Signed   By: Amie Portlandavid  Ormond M.D.   On: 01/01/2019 17:18    Review of Systems  Constitutional: Negative for chills and fever.  Respiratory: Negative for shortness of breath and wheezing.   Cardiovascular: Negative for chest pain and palpitations.  Musculoskeletal: Positive for joint pain (R hip).   Blood pressure 134/64, pulse 80, temperature 99.1 F (37.3 C), temperature source Oral, resp. rate 16, height 5\' 3"  (1.6 m), weight 54.4 kg, SpO2 98 %. Physical Exam Vitals signs and nursing note reviewed.  Constitutional:      Comments: Awake Yelling at times to "cut these things off now"  Cardiovascular:     Rate and Rhythm: Normal rate and regular rhythm.     Heart sounds: S1 normal and S2 normal.  Pulmonary:     Effort: Pulmonary effort is normal. No respiratory distress.     Comments: Clear anterior fields Abdominal:     Comments: Soft, NTND, + BS   Musculoskeletal:     Comments: Pelvis no traumatic wounds or rash, no ecchymosis, stable to manual stress, nontender  Right Lower Extremity  Inspection: R hip/thigh is abducted, shortened and externally rotated  No acute findings noted to  R knee, lower leg, ankle  or foot No traumatic wounds of note  Bony eval: TTP R hip, did not manipulate R hip  Right knee is nontender Right lower leg, ankle and foot are nontender No crepitus, gross motion or pain with manipulation of knee, lower leg and ankle   Soft tissue: Soft tissue over R hip is stable but could not completely evaluate given position of R lower extremity   No appreciable knee effusion, no ankle effusion   Ankle is stable with evaluation   Sensation: DPN, SPN, TN sensation grossly intact Motor: EHL, FHL, lesser toe motor functions grossly intact Vascular:  +  DP pulse Foot is cool but symmetric to contra-lateral side  Compartments are soft  Left Lower Extremity             no open wounds or lesions, no swelling or ecchymosis   Nontender hip, knee, ankle and foot             No crepitus or gross motion noted with manipulation of the Left leg  No knee or ankle effusion             No pain with axial loading or logrolling of the hip.   Knee  stable to varus/ valgus and anterior/posterior stress             No pain with manipulation of the ankle or foot             No blocks to motion noted  Sens DPN, SPN, TN intact  Motor EHL, FHL, lesser toe motor, Ext, flex, evers 5/5  DP 2+, PT 2+, No significant edema             Compartments are soft and nontender, no pain with passive stretching  B upper extremities      B upper extremities in soft restraints as pt has pulled out 2 ivs already      Motor and sensory functions grossly intact     + radial pulses   Neurological:     Mental Status: She is alert.     Comments: Unable to assess coordination or gain      Assessment/Plan:  85 y/o female s/p fall with R 4 part IT femur fracture   -fall  - R 4 part intertrochanteric femur fracture   OR for IMN today   WBAT post op  No ROM restrictions post op   PT/OT post op  Home vs SNF?   Did discuss that pts mobility and overall health may further decline as a result of her hip fracture. It is imperative to be aggressive with therapies and to ensure adequate nutrition to optimize recovery   - Pain management:  Minimize opioids  Schedule tylenol post op   - ABL anemia/Hemodynamics  Monitor   - Medical issues   Per primary   - DVT/PE prophylaxis:  lovenox x 30 days post op   - ID:   periop abx  - Metabolic Bone Disease:  Mechanism and fracture indicative of osteoporosis    Check vitamin d (25OH vit D)  - Activity:  WBAT post op   - FEN/GI prophylaxis/Foley/Lines:  RD consult post op to eval nutritional needs   - Dispo:   OR for IMN R hip today      Jari Pigg, PA-C [phone] (C) 01/02/2019, 11:20 AM  [facility] [place] [id number] [phone] Domingo Sep (F)

## 2019-01-02 NOTE — Progress Notes (Signed)
CSW acknowledges SNF consult pending PT/OT recs.   Khan Chura, LCSW [phone]  

## 2019-01-02 NOTE — Progress Notes (Signed)
Pt is confused and agitated, continues to remove tele montior and has removed 2 IV sites. Texted Dr. Baltazar Najjar (covering MD) updated with concerns of patient refusal to keep tele monitor and continues to remove IV sites.  Awaiting response from MD

## 2019-01-02 NOTE — Transfer of Care (Signed)
Immediate Anesthesia Transfer of Care Note  Patient: Lori Ellis  Procedure(s) Performed: INTRAMEDULLARY (IM) NAIL INTERTROCHANTRIC right hip (Right Hip)  Patient Location: PACU  Anesthesia Type:General  Level of Consciousness: awake and alert   Airway & Oxygen Therapy: Patient Spontanous Breathing and Patient connected to nasal cannula oxygen  Post-op Assessment: Report given to RN and Post -op Vital signs reviewed and stable  Post vital signs: Reviewed and stable  Last Vitals:  Vitals Value Taken Time  BP 149/58 01/02/19 1419  Temp    Pulse 66 01/02/19 1419  Resp 0 01/02/19 1419  SpO2 98 % 01/02/19 1419  Vitals shown include unvalidated device data.  Last Pain:  Vitals:   01/02/19 0358  TempSrc: Oral  PainSc:       Patients Stated Pain Goal: 1 (03/47/42 5956)  Complications: No apparent anesthesia complications

## 2019-01-03 ENCOUNTER — Encounter (HOSPITAL_COMMUNITY): Payer: Self-pay | Admitting: Orthopedic Surgery

## 2019-01-03 LAB — BASIC METABOLIC PANEL
Anion gap: 12 (ref 5–15)
BUN: 23 mg/dL (ref 8–23)
CO2: 24 mmol/L (ref 22–32)
Calcium: 8.9 mg/dL (ref 8.9–10.3)
Chloride: 104 mmol/L (ref 98–111)
Creatinine, Ser: 1.11 mg/dL — ABNORMAL HIGH (ref 0.44–1.00)
GFR calc Af Amer: 49 mL/min — ABNORMAL LOW (ref >60)
GFR calc non Af Amer: 42 mL/min — ABNORMAL LOW (ref >60)
Glucose, Bld: 154 mg/dL — ABNORMAL HIGH (ref 70–99)
Potassium: 3.5 mmol/L (ref 3.5–5.1)
Sodium: 140 mmol/L (ref 135–145)

## 2019-01-03 LAB — GLUCOSE, CAPILLARY
Glucose-Capillary: 150 mg/dL — ABNORMAL HIGH (ref 70–99)
Glucose-Capillary: 136 mg/dL — ABNORMAL HIGH (ref 70–99)
Glucose-Capillary: 126 mg/dL — ABNORMAL HIGH (ref 70–99)

## 2019-01-03 LAB — VITAMIN D 25 HYDROXY (VIT D DEFICIENCY, FRACTURES): Vit D, 25-Hydroxy: 34.3 ng/mL (ref 30.0–100.0)

## 2019-01-03 LAB — CBC
HCT: 33.4 % — ABNORMAL LOW (ref 36.0–46.0)
Hemoglobin: 10.9 g/dL — ABNORMAL LOW (ref 12.0–15.0)
MCH: 32.2 pg (ref 26.0–34.0)
MCHC: 32.6 g/dL (ref 30.0–36.0)
MCV: 98.5 fL (ref 80.0–100.0)
Platelets: 198 10*3/uL (ref 150–400)
RBC: 3.39 MIL/uL — ABNORMAL LOW (ref 3.87–5.11)
RDW: 12.2 % (ref 11.5–15.5)
WBC: 16.7 10*3/uL — ABNORMAL HIGH (ref 4.0–10.5)
nRBC: 0 % (ref 0.0–0.2)

## 2019-01-03 MED ORDER — ASCORBIC ACID 500 MG PO TABS: 500.0000 mg | ORAL_TABLET | Freq: Every day | ORAL | 0 refills | Status: DC

## 2019-01-03 MED ORDER — BOOST / RESOURCE BREEZE PO LIQD CUSTOM
1.0000 | Freq: Three times a day (TID) | ORAL | Status: DC
  Administered 2019-01-03 – 2019-01-09 (×11): 1 via ORAL

## 2019-01-03 MED ORDER — ENOXAPARIN SODIUM 30 MG/0.3ML ~~LOC~~ SOLN: 30.0000 mg | SUBCUTANEOUS | 0 refills | Status: DC

## 2019-01-03 MED ORDER — VITAMIN D3 25 MCG PO TABS: 2000.0000 [IU] | ORAL_TABLET | Freq: Two times a day (BID) | ORAL | Status: DC

## 2019-01-03 NOTE — Plan of Care (Signed)
  Problem: Education: Goal: Knowledge of General Education information will improve Description: Including pain rating scale, medication(s)/side effects and non-pharmacologic comfort measures Outcome: Progressing   Problem: Clinical Measurements: Goal: Will remain free from infection Outcome: Progressing   Problem: Activity: Goal: Risk for activity intolerance will decrease Outcome: Progressing   Problem: Nutrition: Goal: Adequate nutrition will be maintained Outcome: Progressing   Problem: Pain Managment: Goal: General experience of comfort will improve Outcome: Progressing   

## 2019-01-03 NOTE — Progress Notes (Signed)
Orthopedic Trauma Service Progress Note  Patient ID: Silvio PateDoris B Ollinger MRN: [id number] DOB/AGE: 83/06/1920 83 y.o.  Subjective:  Appears to be doing well Has picked her distal 2 dressings off   In soft restraints again   At baseline pt lives alone but has a caregiver She uses a wheelchair essentially as a walker to ambulate    ROS Difficult to obtain   Objective:   VITALS:   Vitals:   01/03/19 0020 01/03/19 0650 01/03/19 0700 01/03/19 0802  BP:  (!) 129/54  119/71  Pulse:  97  86  Resp:  18  17  Temp:  98.4 F (36.9 C)  98.2 F (36.8 C)  TempSrc:  Oral  Oral  SpO2: 95% (!) 85% 100% 100%  Weight:      Height:        Estimated body mass index is 21.26 kg/m as calculated from the following:   Height as of this encounter: 5\' 3"  (1.6 m).   Weight as of this encounter: 54.4 kg.   Intake/Output      07/16 0701 - 07/17 0700 07/17 0701 - 07/18 0700   P.O. 570    I.V. (mL/kg) 870 (16)    Total Intake(mL/kg) 1440 (26.5)    Urine (mL/kg/hr) 502 (0.4)    Blood 75    Total Output 577    Net +863         Urine Occurrence 1 x      LABS  Results for orders placed or performed during the hospital encounter of 01/01/19 (from the past 24 hour(s))  Glucose, capillary     Status: Abnormal   Collection Time: 01/02/19 11:06 AM  Result Value Ref Range   Glucose-Capillary 157 (H) 70 - 99 mg/dL  Glucose, capillary     Status: Abnormal   Collection Time: 01/02/19  2:58 PM  Result Value Ref Range   Glucose-Capillary 138 (H) 70 - 99 mg/dL  Glucose, capillary     Status: Abnormal   Collection Time: 01/02/19  3:42 PM  Result Value Ref Range   Glucose-Capillary 183 (H) 70 - 99 mg/dL   Comment 1 Notify RN   Glucose, capillary     Status: Abnormal   Collection Time: 01/02/19  9:15 PM  Result Value Ref Range   Glucose-Capillary 163 (H) 70 - 99 mg/dL  Basic metabolic panel     Status: Abnormal   Collection  Time: 01/03/19  4:26 AM  Result Value Ref Range   Sodium 140 135 - 145 mmol/L   Potassium 3.5 3.5 - 5.1 mmol/L   Chloride 104 98 - 111 mmol/L   CO2 24 22 - 32 mmol/L   Glucose, Bld 154 (H) 70 - 99 mg/dL   BUN 23 8 - 23 mg/dL   Creatinine, Ser 4.091.11 (H) 0.44 - 1.00 mg/dL   Calcium 8.9 8.9 - 81.110.3 mg/dL   GFR calc non Af Amer 42 (L) >60 mL/min   GFR calc Af Amer 49 (L) >60 mL/min   Anion gap 12 5 - 15  CBC     Status: Abnormal   Collection Time: 01/03/19  4:26 AM  Result Value Ref Range   WBC 16.7 (H) 4.0 - 10.5 K/uL   RBC 3.39 (L) 3.87 - 5.11 MIL/uL   Hemoglobin 10.9 (L) 12.0 - 15.0  g/dL   HCT 33.4 (L) 36.0 - 46.0 %   MCV 98.5 80.0 - 100.0 fL   MCH 32.2 26.0 - 34.0 pg   MCHC 32.6 30.0 - 36.0 g/dL   RDW 12.2 11.5 - 15.5 %   Platelets 198 150 - 400 K/uL   nRBC 0.0 0.0 - 0.2 %  Glucose, capillary     Status: Abnormal   Collection Time: 01/03/19  8:00 AM  Result Value Ref Range   Glucose-Capillary 150 (H) 70 - 99 mg/dL     PHYSICAL EXAM:   Gen: awake, sitting up in bed, soft restraints  Lungs: breathing unlabored Cardiac: regular  Abd: soft, + BS Ext:      Right Lower Extremity   Dressing R hip stable, intact  Dressings over mid lateral thigh and lateral distal thigh have been removed but wounds are stable, clean and dry   Ext warm   Swelling controlled  Distal motor and sensory functions intact  No DCT   Compartments are soft   No pain with passives stretch   Assessment/Plan: 1 Day Post-Op   Active Problems:   CKD (chronic kidney disease) stage 3, GFR 30-59 ml/min (HCC)   Hypothyroidism   Type II diabetes mellitus with renal manifestations (HCC)   Benign essential HTN   Closed intertrochanteric fracture, right, initial encounter (HCC)   Atrial flutter by electrocardiogram (HCC)   Anti-infectives (From admission, onward)   Start     Dose/Rate Route Frequency Ordered Stop   01/02/19 1545  clindamycin (CLEOCIN) IVPB 600 mg     600 mg 100 mL/hr over 30 Minutes  Intravenous Every 6 hours 01/02/19 1541 01/03/19 0012   01/02/19 1215  clindamycin (CLEOCIN) IVPB 600 mg     600 mg 100 mL/hr over 30 Minutes Intravenous On call to O.R. 01/02/19 6301 01/03/19 0559    .  POD/HD#: 12 85 y/o female s/p fall with R 4 part IT femur fracture    -fall   - R 4 part intertrochanteric femur fracture s/p IMN              WBAT R leg  ROM as tolerated  PT/OT evals  Dressing changes as needed   Place thigh high ted hose so pt does not pick at sutures/incisions   Will likely need snf   Had a good experience at [facility] in the past   - Pain management:             Minimize opioids             Schedule tylenol    Appears to be doing well with scheduled tylenol    - ABL anemia/Hemodynamics             Monitor   Currently stable    - Medical issues              Per primary    - DVT/PE prophylaxis:             lovenox x 30 days post op    - ID:              periop abx to be completed today    - Metabolic Bone Disease:             Mechanism and fracture indicative of osteoporosis                          vitamin d levels look ok  Continue with vitamin d and calcium  Optimize nutrition    - Activity:             WBAT post op    - FEN/GI prophylaxis/Foley/Lines:             RD consult post op to eval nutritional needs    - Dispo:             Therapy evals  Will likely need snf   Weightbearing: WBAT RLE Insicional and dressing care: Daily dressing changes with 4x4's and tape Orthopedic device(s): walker Showering: ok to shower and clean wounds with soap and water only  VTE prophylaxis: Lovenox 40mg  qd 30 days  Pain control: tylenol, norco for severe pain  Follow - up plan: 3 weeks Contact information:  Myrene GalasMichael Handy MD, Montez MoritaKeith Areebah Meinders PA-C   Mearl LatinKeith W. Saren Corkern, PA-C [phone] (C) 01/03/2019, 9:55 AM  [facility] [address] [place] [address] [phone] Collier Bullock(O) [phone] (F)

## 2019-01-03 NOTE — [place] FL2 (Signed)
[place] MEDICAID FL2 LEVEL OF CARE SCREENING TOOL     IDENTIFICATION  Patient Name: Lori Ellis Birthdate: 07/21/1920 Sex: female Admission Date (Current Location): 01/01/2019  [facility] [place]   Facility and Address:  [facility], [address], [place], [place] [address]      Provider Number: [id number]  Attending Physician Name and Address:  [place], Eric J, DO  Relative Name and Phone Number:  Loel LoftyLarry Tewksbury [phone]    Current Level of Care: Hospital Recommended Level of Care: Skilled Nursing Facility Prior Approval Number:    Date Approved/Denied:   PASRR Number: [id number]  Discharge Plan: SNF    Current Diagnoses: Patient Active Problem List   Diagnosis Date Noted  . Closed intertrochanteric fracture, right, initial encounter (HCC) 01/01/2019  . Atrial flutter by electrocardiogram (HCC) 01/01/2019  . Benign essential HTN   . Spinal stenosis, lumbar 06/12/2017  . Chronic bilateral low back pain with bilateral sciatica 03/07/2016  . Spondylolisthesis of lumbar region 03/07/2016  . CKD (chronic kidney disease) stage 3, GFR 30-59 ml/min (HCC) 03/12/2014  . Hypothyroidism 09/02/2013  . Type II diabetes mellitus with renal manifestations (HCC) 09/02/2013  . DDD (degenerative disc disease), lumbosacral 09/01/2013    Orientation RESPIRATION BLADDER Height & Weight     Self  O2(2lpm via [place]) Continent, External catheter Weight: 54.4 kg Height:  5\' 3"  (160 cm)  BEHAVIORAL SYMPTOMS/MOOD NEUROLOGICAL BOWEL NUTRITION STATUS      Continent Diet(See DC summary)  AMBULATORY STATUS COMMUNICATION OF NEEDS Skin   Extensive Assist Verbally Skin abrasions, Bruising, Surgical wounds(Abrasion left elbow; bruising bilateral arms; MASD groin; R Hip surgical incision)                       Personal Care Assistance Level of Assistance  Bathing, Feeding, Dressing, Total care Bathing Assistance: Maximum  assistance Feeding assistance: Independent Dressing Assistance: Maximum assistance Total Care Assistance: Maximum assistance   Functional Limitations Info  Sight, Hearing, Speech Sight Info: Adequate Hearing Info: Adequate Speech Info: Adequate    SPECIAL CARE FACTORS FREQUENCY  PT (By licensed PT), OT (By licensed OT)     PT Frequency: 5x's /week OT Frequency: 5x's/ week            Contractures Contractures Info: Not present    Additional Factors Info  Code Status, Allergies Code Status Info: DNR Allergies Info: Fentanyl, Penicillins, Tetanus Toxoid, Adhesive (Tape), Advil (Ibuprofen), Hydrochlorothiazide, Naproxen Sodium           Current Medications (01/03/2019):  This is the current hospital active medication list Current Facility-Administered Medications  Medication Dose Route Frequency Provider Last Rate Last Dose  . 0.9 %  sodium chloride infusion   Intravenous Continuous Montez MoritaPaul, Keith, PA-C      . acetaminophen (TYLENOL) tablet 500 mg  500 mg Oral Q6H Montez MoritaPaul, Keith, PA-C   500 mg at 01/03/19 1156  . amLODipine (NORVASC) tablet 2.5 mg  2.5 mg Oral QHS Montez MoritaPaul, Keith, PA-C   2.5 mg at 01/02/19 2337  . cholecalciferol (VITAMIN D3) tablet 2,000 Units  2,000 Units Oral BID Montez Moritaaul, Keith, PA-C   2,000 Units at 01/03/19 1003  . docusate sodium (COLACE) capsule 100 mg  100 mg Oral BID Montez Moritaaul, Keith, PA-C   100 mg at 01/02/19 2337  . enoxaparin (LOVENOX) injection 30 mg  30 mg Subcutaneous Q24H Montez Moritaaul, Keith, PA-C   30 mg at 01/03/19 1002  . gabapentin (NEURONTIN) capsule 300  mg  300 mg Oral QHS Ainsley Spinner, PA-C   300 mg at 01/02/19 2337  . HYDROcodone-acetaminophen (NORCO/VICODIN) 5-325 MG per tablet 1-2 tablet  1-2 tablet Oral Q6H PRN Ainsley Spinner, PA-C      . insulin aspart (novoLOG) injection 0-15 Units  0-15 Units Subcutaneous TID WC Ainsley Spinner, PA-C   2 Units at 01/03/19 1159  . levothyroxine (SYNTHROID) tablet 75 mcg  75 mcg Oral QAC breakfast Ainsley Spinner, PA-C   75 mcg at  01/03/19 6967  . lisinopril (ZESTRIL) tablet 20 mg  20 mg Oral q morning - 10a Ainsley Spinner, PA-C   20 mg at 01/03/19 1003  . Melatonin TABS 3 mg  3 mg Oral QHS PRN Ainsley Spinner, PA-C      . menthol-cetylpyridinium (CEPACOL) lozenge 3 mg  1 lozenge Oral PRN Ainsley Spinner, PA-C       Or  . phenol (CHLORASEPTIC) mouth spray 1 spray  1 spray Mouth/Throat PRN Ainsley Spinner, PA-C      . metoCLOPramide (REGLAN) tablet 5-10 mg  5-10 mg Oral Q8H PRN Ainsley Spinner, PA-C       Or  . metoCLOPramide (REGLAN) injection 5-10 mg  5-10 mg Intravenous Q8H PRN Ainsley Spinner, PA-C      . morphine 2 MG/ML injection 0.5 mg  0.5 mg Intravenous Q2H PRN Ainsley Spinner, PA-C   0.5 mg at 01/01/19 2142  . ondansetron (ZOFRAN) tablet 4 mg  4 mg Oral Q6H PRN Ainsley Spinner, PA-C       Or  . ondansetron [facility]) injection 4 mg  4 mg Intravenous Q6H PRN Ainsley Spinner, PA-C      . propranolol (INDERAL) tablet 40 mg  40 mg Oral Daily Ainsley Spinner, PA-C   40 mg at 01/03/19 1003   And  . propranolol (INDERAL) tablet 20 mg  20 mg Oral Q24H Ainsley Spinner, PA-C   20 mg at 01/02/19 1630  . senna (SENOKOT) tablet 8.6 mg  1 tablet Oral BID Ainsley Spinner, PA-C   8.6 mg at 01/03/19 1003  . vitamin C (ASCORBIC ACID) tablet 500 mg  500 mg Oral Daily Ainsley Spinner, PA-C   500 mg at 01/03/19 1003     Discharge Medications: Please see discharge summary for a list of discharge medications.  Relevant Imaging Results:  Relevant Lab Results:   Additional Information [name]  [name], [place]

## 2019-01-03 NOTE — Progress Notes (Signed)
Initial Nutrition Assessment  DOCUMENTATION CODES:   Not applicable  INTERVENTION:  Provide Boost Breeze po TID, each supplement provides 250 kcal and 9 grams of protein.  Encourage adequate PO intake.   NUTRITION DIAGNOSIS:   Increased nutrient needs related to post-op healing as evidenced by estimated needs.  GOAL:   Patient will meet greater than or equal to 90% of their needs  MONITOR:   PO intake, Supplement acceptance, Skin, Weight trends, Labs, I & O's  REASON FOR ASSESSMENT:   Consult Assessment of nutrition requirement/status, Hip fracture protocol  ASSESSMENT:   85 y.o. female with medical history significant of non-insulin-dependent diabetes, degenerative disc disease lumbar spine, hypertension presents after fall. Pt with intertrochanteric fracture right hip. Patient underwent IMN to right intertrochanteric hip fracture on 01/02/2019   Pt with confusion and agitation. RD unable to obtain pt nutrition history at this time. Pt only consumed a couple of bites of food at meal tray today. RD to order nutritional supplements to aid in caloric and protein needs. Noted pt dislikes Ensure. RD to order Boost Breeze instead.   Unable to complete Nutrition-Focused physical exam at this time.   Labs and medications reviewed.   Diet Order:   Diet Order            Diet Carb Modified Fluid consistency: Thin; Room service appropriate? Yes  Diet effective now              EDUCATION NEEDS:   Not appropriate for education at this time  Skin:  Skin Assessment: Skin Integrity Issues: Skin Integrity Issues:: Incisions Incisions: R leg  Last BM:  7/15  Height:   Ht Readings from Last 1 Encounters:  01/01/19 5\' 3"  (1.6 m)    Weight:   Wt Readings from Last 1 Encounters:  01/01/19 54.4 kg    Ideal Body Weight:  52.27 kg  BMI:  Body mass index is 21.26 kg/m.  Estimated Nutritional Needs:   Kcal:  1450-1650  Protein:  55-65 grams  Fluid:  >/= 1.5  L/day    Corrin Parker, MS, RD, LDN Pager # [phone] After hours/ weekend pager # [phone]

## 2019-01-03 NOTE — Discharge Instructions (Addendum)
Orthopaedic Trauma Service Discharge Instructions  General Discharge Instructions  WEIGHT BEARING STATUS: Weightbearing Right Lower Extremity   RANGE OF MOTION/ACTIVITY: unrestricted range of motion R hip and knee   Bone health:  Continue with vitamin d and vitamin c supplementation   Wound Care: daily wound care starting upon discharge from hospital. See instructions below  Discharge Wound Care Instructions  Do NOT apply any ointments, solutions or lotions to pin sites or surgical wounds.  These prevent needed drainage and even though solutions like hydrogen peroxide kill bacteria, they also damage cells lining the pin sites that help fight infection.  Applying lotions or ointments can keep the wounds moist and can cause them to breakdown and open up as well. This can increase the risk for infection. When in doubt call the office.  Surgical incisions should be dressed daily.  If any drainage is noted, use one layer of adaptic, then gauze, Kerlix, and an ace wrap.  Once the incision is completely dry and without drainage, it may be left open to air out.  Showering may begin 36-48 hours later.  Cleaning gently with soap and water.  Traumatic wounds should be dressed daily as well.    One layer of adaptic, gauze, Kerlix, then ace wrap.  The adaptic can be discontinued once the draining has ceased    If you have a wet to dry dressing: wet the gauze with saline the squeeze as much saline out so the gauze is moist (not soaking wet), place moistened gauze over wound, then place a dry gauze over the moist one, followed by Kerlix wrap, then ace wrap.  DVT/PE prophylaxis: Lovenox, daily subcutaneous injection x 30 days   Diet: as you were eating previously.  Can use over the counter stool softeners and bowel preparations, such as Miralax, to help with bowel movements.  Narcotics can be constipating.  Be sure to drink plenty of fluids  PAIN MEDICATION USE AND EXPECTATIONS  You have likely  been given narcotic medications to help control your pain.  After a traumatic event that results in an fracture (broken bone) with or without surgery, it is ok to use narcotic pain medications to help control one's pain.  We understand that everyone responds to pain differently and each individual patient will be evaluated on a regular basis for the continued need for narcotic medications. Ideally, narcotic medication use should last no more than 6-8 weeks (coinciding with fracture healing).   As a patient it is your responsibility as well to monitor narcotic medication use and report the amount and frequency you use these medications when you come to your office visit.   We would also advise that if you are using narcotic medications, you should take a dose prior to therapy to maximize you participation.  IF YOU ARE ON NARCOTIC MEDICATIONS IT IS NOT PERMISSIBLE TO OPERATE A MOTOR VEHICLE (MOTORCYCLE/CAR/TRUCK/MOPED) OR HEAVY MACHINERY DO NOT MIX NARCOTICS WITH OTHER CNS (CENTRAL NERVOUS SYSTEM) DEPRESSANTS SUCH AS ALCOHOL   STOP SMOKING OR USING NICOTINE PRODUCTS!!!!  As discussed nicotine severely impairs your body's ability to heal surgical and traumatic wounds but also impairs bone healing.  Wounds and bone heal by forming microscopic blood vessels (angiogenesis) and nicotine is a vasoconstrictor (essentially, shrinks blood vessels).  Therefore, if vasoconstriction occurs to these microscopic blood vessels they essentially disappear and are unable to deliver necessary nutrients to the healing tissue.  This is one modifiable factor that you can do to dramatically increase your chances of  healing your injury.    (This means no smoking, no nicotine gum, patches, etc)     ICE AND ELEVATE INJURED/OPERATIVE EXTREMITY  Using ice and elevating the injured extremity above your heart can help with swelling and pain control.  Icing in a pulsatile fashion, such as 20 minutes on and 20 minutes off, can be  followed.    Do not place ice directly on skin. Make sure there is a barrier between to skin and the ice pack.    Using frozen items such as frozen peas works well as the conform nicely to the are that needs to be iced.  USE AN ACE WRAP OR TED HOSE FOR SWELLING CONTROL  In addition to icing and elevation, Ace wraps or TED hose are used to help limit and resolve swelling.  It is recommended to use Ace wraps or TED hose until you are informed to stop.    When using Ace Wraps start the wrapping distally (farthest away from the body) and wrap proximally (closer to the body)   Example: If you had surgery on your leg or thing and you do not have a splint on, start the ace wrap at the toes and work your way up to the thigh        If you had surgery on your upper extremity and do not have a splint on, start the ace wrap at your fingers and work your way up to the upper arm  [place]: [phone]

## 2019-01-03 NOTE — Progress Notes (Signed)
PROGRESS NOTE    Lori PateDoris B Carmickle  ZOX:[id number] DOB: 01/12/1921 DOA: 01/01/2019 PCP: Malka SoJobe, Daniel B., MD    Brief Narrative:   Lori Ellis is a 85 y.o. female with medical history significant of non-insulin-dependent diabetes, degenerative disc disease lumbar spine, hypertension.  Patient has been living in her own home but has 24/7 care.  She is mobile in a wheelchair and does use a walker but has been less mobile over time according to her son.  Patient was at home in her usual state of health when she had a mechanical fall striking her head and her hip.  She had no apparent loss of consciousness.  Because of her significant pain following her fall as well as a laceration to her occiput she presented to [facility] department for evaluation.  She is now admitted for intertrochanteric fracture right hip patient has significant pain at rest and worse pain with movement.  He has been restless and uncomfortable.    ED Course: Evaluation in the emergency department revealed the patient to have a laceration to the right occiput.  This was repaired with interrupted sutures.  She had a CT scan of the head which was unremarkable except for cerebral atrophy.  Patient did have x-rays of the pelvis and hip and was found to have an intertrochanteric fracture of the right hip.  She developed poor bone mineralization was raised in that report.  Patient was referred for admission for fractured hip with operative repair planned after orthopedic consult in the a.m.   Assessment & Plan:   Active Problems:   CKD (chronic kidney disease) stage 3, GFR 30-59 ml/min (HCC)   Hypothyroidism   Type II diabetes mellitus with renal manifestations (HCC)   Benign essential HTN   Closed intertrochanteric fracture, right, initial encounter (HCC)   Atrial flutter by electrocardiogram [facility](HCC)  Right intertrochanteric hip fracture s/p IMN Patient presenting from home with decreased ability to ambulate with  right-sided hip pain.  X-ray on admission with findings of right intertrochanteric hip fracture.  Patient underwent IMN to right intertrochanteric hip fracture on 01/02/2019 by Dr. Carola FrostHandy. --Weightbearing as tolerated, with a walker --Lovenox 30 mg subcutaneously every 24 hours x 30 days per orthopedics --Fall precautions --Continue pain management with Norco, morphine prn --Pending PT/OT evaluation --Will need SNF placement, SW/CM for coordination  Essential hypertension Blood pressure well controlled 138/74 this morning. --Continue home amlodipine 2.5 mg p.o. daily, lisinopril 20 mg p.o. daily, propranolol 20 mg p.o. daily --Continue monitor blood pressure closely  Hypothyroidism: Continue levothyroxine 75 mcg p.o. daily  Acute hospital-acquired delirium Patient overnight with increased confusion with agitation.  Likely secondary to advanced age, possible underlying dementia, and active pain associated with right hip fracture. --Continue restraints to preclude removal of IV catheters and removal of surgical wound dressing --Delirium precautions, blinds open during the day/closed at night, TV off and quiet at night --Melatonin 3 mg p.o. nightly    DVT prophylaxis: Lovenox Code Status: DNR Family Communication: None Disposition Plan: Inpatient hospitalization, likely will need SNF on discharge   Consultants:   Orthopedic surgery, Dr. Carola FrostHandy  Procedures:   IMN 01/02/2019 by Dr. Carola FrostHandy  Antimicrobials:   Perioperative clindamycin   Subjective: Patient seen and examined at bedside, resting comfortably.  Continues in soft restraints.  She believes she is at the dentist office. No other complaints at this time.  Denies headache, no chest pain, no shortness of breath, no palpitations, no abdominal pain.  No other acute  concerns overnight per nursing staff.  Objective: Vitals:   01/03/19 0650 01/03/19 0700 01/03/19 0802 01/03/19 1003  BP: (!) 129/54  119/71 119/71  Pulse: 97  86  86  Resp: 18  17   Temp: 98.4 F (36.9 C)  98.2 F (36.8 C)   TempSrc: Oral  Oral   SpO2: (!) 85% 100% 100%   Weight:      Height:        Intake/Output Summary (Last 24 hours) at 01/03/2019 1035 Last data filed at 01/03/2019 0500 Gross per 24 hour  Intake 1440 ml  Output 577 ml  Net 863 ml   Filed Weights   01/01/19 1436  Weight: 54.4 kg    Examination:  General exam: Alert, oriented to person/time, not place (dentist office), no acute distress Respiratory system: Clear to auscultation. Respiratory effort normal. Cardiovascular system: S1 & S2 heard, RRR. No JVD, murmurs, rubs, gallops or clicks. No pedal edema. Gastrointestinal system: Abdomen is nondistended, soft and nontender. No organomegaly or masses felt. Normal bowel sounds heard. Central nervous system: Alert, oriented to person/time, not place (dentist office). No focal neurological deficits. Extremities: Moving all extremities independently, neurovascular intact, surgical site/wound noted, clean/dry/intact although dressings have been removed by patient Skin: No rashes, lesions or ulcers Psychiatry: Judgement and insight appear normal. Mood & affect appropriate.     Data Reviewed: I have personally reviewed following labs and imaging studies  CBC: Recent Labs  Lab 01/01/19 1445 01/02/19 0859 01/03/19 0426  WBC 9.0 12.8* 16.7*  NEUTROABS 5.7  --   --   HGB 12.3 12.7 10.9*  HCT 38.9 38.2 33.4*  MCV 101.3* 97.7 98.5  PLT 191 196 198   Basic Metabolic Panel: Recent Labs  Lab 01/01/19 1445 01/02/19 0859 01/03/19 0426  NA 141 142 140  K 4.0 3.4* 3.5  CL 103 101 104  CO2 GLUCOSE 166* 160* 154*  BUN CREATININE 1.06* 1.08* 1.11*  CALCIUM 9.1 9.4 8.9   GFR: Estimated Creatinine Clearance: 25.1 mL/min (A) (by C-G formula based on SCr of 1.11 mg/dL (H)). Liver Function Tests: No results for input(s): AST, ALT, ALKPHOS, BILITOT, PROT, ALBUMIN in the last 168 hours. No results for  input(s): LIPASE, AMYLASE in the last 168 hours. No results for input(s): AMMONIA in the last 168 hours. Coagulation Profile: Recent Labs  Lab 01/01/19 1445 01/02/19 0859  INR 1.0 1.0   Cardiac Enzymes: No results for input(s): CKTOTAL, CKMB, CKMBINDEX, TROPONINI in the last 168 hours. BNP (last 3 results) No results for input(s): PROBNP in the last 8760 hours. HbA1C: Recent Labs    01/01/19 1941  HGBA1C 5.9*   CBG: Recent Labs  Lab 01/02/19 1106 01/02/19 1458 01/02/19 1542 01/02/19 2115 01/03/19 0800  GLUCAP 157* 138* 183* 163* 150*   Lipid Profile: No results for input(s): CHOL, HDL, LDLCALC, TRIG, CHOLHDL, LDLDIRECT in the last 72 hours. Thyroid Function Tests: No results for input(s): TSH, T4TOTAL, FREET4, T3FREE, THYROIDAB in the last 72 hours. Anemia Panel: No results for input(s): VITAMINB12, FOLATE, FERRITIN, TIBC, IRON, RETICCTPCT in the last 72 hours. Sepsis Labs: No results for input(s): PROCALCITON, LATICACIDVEN in the last 168 hours.  Recent Results (from the past 240 hour(s))  SARS Coronavirus 2 (CEPHEID - Performed in [facility] hospital lab), Hosp Order     Status: None   Collection Time: 01/01/19  7:01 PM   Specimen: Nasopharyngeal Swab  Result Value Ref Range Status   SARS  Coronavirus 2 NEGATIVE NEGATIVE Final    Comment: (NOTE) If result is NEGATIVE SARS-CoV-2 target nucleic acids are NOT DETECTED. The SARS-CoV-2 RNA is generally detectable in upper and lower  respiratory specimens during the acute phase of infection. The lowest  concentration of SARS-CoV-2 viral copies this assay can detect is 250  copies / mL. A negative result does not preclude SARS-CoV-2 infection  and should not be used as the sole basis for treatment or other  patient management decisions.  A negative result may occur with  improper specimen collection / handling, submission of specimen other  than nasopharyngeal swab, presence of viral mutation(s) within the  areas  targeted by this assay, and inadequate number of viral copies  (<250 copies / mL). A negative result must be combined with clinical  observations, patient history, and epidemiological information. If result is POSITIVE SARS-CoV-2 target nucleic acids are DETECTED. The SARS-CoV-2 RNA is generally detectable in upper and lower  respiratory specimens dur ing the acute phase of infection.  Positive  results are indicative of active infection with SARS-CoV-2.  Clinical  correlation with patient history and other diagnostic information is  necessary to determine patient infection status.  Positive results do  not rule out bacterial infection or co-infection with other viruses. If result is PRESUMPTIVE POSTIVE SARS-CoV-2 nucleic acids MAY BE PRESENT.   A presumptive positive result was obtained on the submitted specimen  and confirmed on repeat testing.  While 2019 novel coronavirus  (SARS-CoV-2) nucleic acids may be present in the submitted sample  additional confirmatory testing may be necessary for epidemiological  and / or clinical management purposes  to differentiate between  SARS-CoV-2 and other Sarbecovirus currently known to infect humans.  If clinically indicated additional testing with an alternate test  methodology [phone]) is advised. The SARS-CoV-2 RNA is generally  detectable in upper and lower respiratory sp ecimens during the acute  phase of infection. The expected result is Negative. Fact Sheet for Patients:  BoilerBrush.com.cy Fact Sheet for Healthcare Providers: https://pope.com/ This test is not yet approved or cleared by the [place] FDA and has been authorized for detection and/or diagnosis of SARS-CoV-2 by FDA under an Emergency Use Authorization (EUA).  This EUA will remain in effect (meaning this test can be used) for the duration of the COVID-19 declaration under Section 564(b)(1) of the Act, 21 U.S.C. section  360bbb-3(b)(1), unless the authorization is terminated or revoked sooner. Performed at [facility], [address] [place], [place] [address]   Surgical pcr screen     Status: Abnormal   Collection Time: 01/01/19  9:17 PM   Specimen: Nasal Mucosa; Nasal Swab  Result Value Ref Range Status   MRSA, PCR NEGATIVE NEGATIVE Final   Staphylococcus aureus POSITIVE (A) NEGATIVE Final    Comment: (NOTE) The Xpert SA Assay (FDA approved for NASAL specimens in patients 69 years of age and older), is one component of a comprehensive surveillance program. It is not intended to diagnose infection nor to guide or monitor treatment. Performed at [facility], [address], [place], [place] [address]          Radiology Studies: Dg Chest 1 View  Result Date: 01/01/2019 CLINICAL DATA:  Preop EXAM: CHEST  1 VIEW COMPARISON:  None. FINDINGS: Cardiomegaly, diffuse aortic atherosclerosis. Lungs clear. No effusions or edema. No acute bony abnormality. IMPRESSION: Cardiomegaly.  No active disease. Electronically Signed   By: Charlett Nose M.D.   On: 01/01/2019 17:17  Ct Head Wo Contrast  Result Date: 01/01/2019 CLINICAL DATA:  Polytrauma, can call fall. EXAM: CT HEAD WITHOUT CONTRAST CT CERVICAL SPINE WITHOUT CONTRAST TECHNIQUE: Multidetector CT imaging of the head and cervical spine was performed following the standard protocol without intravenous contrast. Multiplanar CT image reconstructions of the cervical spine were also generated. COMPARISON:  None. FINDINGS: CT HEAD FINDINGS Brain: Patchy low-density throughout the deep white matter likely chronic small vessel disease. Mild age related volume loss. No acute intracranial abnormality. Specifically, no hemorrhage, hydrocephalus, mass lesion, acute infarction, or significant intracranial injury. Vascular: No hyperdense vessel or unexpected calcification. Skull: No acute calvarial abnormality. Sinuses/Orbits: Visualized paranasal sinuses and  mastoids clear. Orbital soft tissues unremarkable. Other: None CT CERVICAL SPINE FINDINGS Alignment: Normal alignment. Skull base and vertebrae: No acute fracture. No primary bone lesion or focal pathologic process. Soft tissues and spinal canal: No prevertebral fluid or swelling. No visible canal hematoma. Disc levels: Diffuse degenerative disc disease with disc space narrowing and spurring most pronounced from C4-5 through C6-7. Diffuse bilateral degenerative facet disease. Upper chest: Aortic and great vessel atherosclerosis. No acute findings. Other: Carotid bulb calcifications.  No acute findings. IMPRESSION: Atrophy, chronic microvascular disease. No acute intracranial abnormality. Degenerative disc and facet disease in the cervical spine. No acute bony abnormality. Electronically Signed   By: Rolm Baptise M.D.   On: 01/01/2019 15:56   Ct Cervical Spine Wo Contrast  Result Date: 01/01/2019 CLINICAL DATA:  Polytrauma, can call fall. EXAM: CT HEAD WITHOUT CONTRAST CT CERVICAL SPINE WITHOUT CONTRAST TECHNIQUE: Multidetector CT imaging of the head and cervical spine was performed following the standard protocol without intravenous contrast. Multiplanar CT image reconstructions of the cervical spine were also generated. COMPARISON:  None. FINDINGS: CT HEAD FINDINGS Brain: Patchy low-density throughout the deep white matter likely chronic small vessel disease. Mild age related volume loss. No acute intracranial abnormality. Specifically, no hemorrhage, hydrocephalus, mass lesion, acute infarction, or significant intracranial injury. Vascular: No hyperdense vessel or unexpected calcification. Skull: No acute calvarial abnormality. Sinuses/Orbits: Visualized paranasal sinuses and mastoids clear. Orbital soft tissues unremarkable. Other: None CT CERVICAL SPINE FINDINGS Alignment: Normal alignment. Skull base and vertebrae: No acute fracture. No primary bone lesion or focal pathologic process. Soft tissues and  spinal canal: No prevertebral fluid or swelling. No visible canal hematoma. Disc levels: Diffuse degenerative disc disease with disc space narrowing and spurring most pronounced from C4-5 through C6-7. Diffuse bilateral degenerative facet disease. Upper chest: Aortic and great vessel atherosclerosis. No acute findings. Other: Carotid bulb calcifications.  No acute findings. IMPRESSION: Atrophy, chronic microvascular disease. No acute intracranial abnormality. Degenerative disc and facet disease in the cervical spine. No acute bony abnormality. Electronically Signed   By: Rolm Baptise M.D.   On: 01/01/2019 15:56   Pelvis Portable  Result Date: 01/02/2019 CLINICAL DATA:  Postoperative radiograph, right hip fracture. EXAM: PORTABLE PELVIS 1-2 VIEWS COMPARISON:  Radiographs 1 day prior FINDINGS: Postsurgical changes from intramedullary nail and transcervical fixation screw placement of the right femur. Persistent displacement of the fracture fragment containing the lesser trochanter. Expected postsurgical soft tissue changes including soft tissue gas and air within the joint. No unexpected radiopaque foreign body. Femoral heads remain normally located. Severe degenerative changes are noted elsewhere throughout the left hip and pelvis similar to prior exam. IMPRESSION: Expected postsurgical changes following intramedullary nail placement of the right femur. Electronically Signed   By: Lovena Le M.D.   On: 01/02/2019 19:33   Chest Portable 1 View  Result Date:  01/01/2019 CLINICAL DATA:  Preoperative assessment for hip fracture EXAM: PORTABLE CHEST 1 VIEW COMPARISON:  January 01, 2019 study obtained earlier in the day FINDINGS: No evident edema or consolidation. Heart is borderline enlarged with pulmonary vascularity normal. No adenopathy. There is aortic atherosclerosis. Bones are osteoporotic. IMPRESSION: No edema or consolidation. Borderline cardiac enlargement. Aortic Atherosclerosis (ICD10-I70.0). Bones  osteoporotic. Electronically Signed   By: Bretta BangWilliam  Woodruff III M.D.   On: 01/01/2019 19:46   Dg Knee Right Port  Result Date: 01/02/2019 CLINICAL DATA:  Right hip fracture. EXAM: PORTABLE RIGHT KNEE - 1-2 VIEW COMPARISON:  None. FINDINGS: No fracture or dislocation is noted. Moderate narrowing of medial joint space is noted. Mild patellar spurring is noted. No joint effusion is noted. Vascular calcifications are noted. IMPRESSION: Moderate degenerative joint disease is noted medially. No acute abnormality seen in the right knee. Electronically Signed   By: Lupita RaiderJames  Green Jr M.D.   On: 01/02/2019 08:58   Dg C-arm 1-60 Min  Result Date: 01/02/2019 CLINICAL DATA:  ORIF of right hip EXAM: DG C-ARM 61-120 MIN COMPARISON:  None. FINDINGS: ORIF of right hip with a gamma nail, intramedullary rod, and a distal interlocking screw. IMPRESSION: ORIF of right hip fracture. Electronically Signed   By: Gerome Samavid  Williams III M.D   On: 01/02/2019 15:44   Dg Hip Operative Unilat W Or W/o Pelvis Right  Result Date: 01/02/2019 CLINICAL DATA:  ORIF right hip. FLUOROSCOPY TIME:  88.7 seconds. Images: 5 EXAM: OPERATIVE RIGHT HIP (WITH PELVIS IF PERFORMED) 5 VIEWS TECHNIQUE: Fluoroscopic spot image(s) were submitted for interpretation post-operatively. COMPARISON:  None. FINDINGS: A gamma nail and intramedullary femoral rod are placed across the right hip fracture with a distal interlocking screw. Hardware is in good position. IMPRESSION: Right hip fracture repair as above. Electronically Signed   By: Gerome Samavid  Williams III M.D   On: 01/02/2019 15:38   Dg Hip Unilat With Pelvis 2-3 Views Right  Result Date: 01/01/2019 CLINICAL DATA:  Preop images.  Hip fracture. EXAM: DG HIP (WITH OR WITHOUT PELVIS) 2-3V RIGHT COMPARISON:  None. FINDINGS: Three views of the pelvis and right proximal femur showing intertrochanteric fracture. Primary fracture components are displaced approximately 1 cm. There is varus angulation. There are separate  greater lesser trochanteric fracture components. No other fractures. No bone lesions. Skeletal structures are demineralized. Hip joints, SI joints and symphysis pubis are normally aligned. IMPRESSION: Intertrochanteric fracture of the proximal right femur as described. Electronically Signed   By: Amie Portlandavid  Ormond M.D.   On: 01/01/2019 17:18   Dg Femur Port, Min 2 Views Right  Result Date: 01/02/2019 CLINICAL DATA:  Postoperative evaluation, open reduction internal fixation of the right hip fracture. EXAM: RIGHT FEMUR PORTABLE 2 VIEW COMPARISON:  Radiographs January 01, 2019. FINDINGS: Expected postsurgical changes following placement of a right femoral intramedullary nail and transcervical fixation screw. Distal fixation screw noted at the level of the distal femoral diaphysis as well. No evidence of a prosthetic failure or immediate postoperative complication. Stable displacement of the inter trochanteric fracture fragment containing the lesser trochanter of the femur. Soft tissue gas and air within the right hip joint expected postoperatively. There is vascular calcification in the medial thigh. Degenerative changes are present at the right hip and pelvis, unchanged from prior studies. No unexpected radiopaque foreign body. IMPRESSION: Expected postoperative appearance following open reduction, internal fixation of the right femur fracture. Electronically Signed   By: Kreg ShropshirePrice  DeHay M.D.   On: 01/02/2019 19:36  Scheduled Meds:  acetaminophen  500 mg Oral Q6H   amLODipine  2.5 mg Oral QHS   cholecalciferol  2,000 Units Oral BID   docusate sodium  100 mg Oral BID   enoxaparin (LOVENOX) injection  30 mg Subcutaneous Q24H   gabapentin  300 mg Oral QHS   insulin aspart  0-15 Units Subcutaneous TID WC   levothyroxine  75 mcg Oral QAC breakfast   lisinopril  20 mg Oral q morning - 10a   propranolol  40 mg Oral Daily   And   propranolol  20 mg Oral Q24H   senna  1 tablet Oral BID    vitamin C  500 mg Oral Daily   Continuous Infusions:  sodium chloride       LOS: 2 days    Time spent: 29 minutes spent on chart review, discussion with nursing staff, consultants, updating family and interview/physical exam; more than 50% of that time was spent in counseling and/or coordination of care.    Alvira PhilipsEric J [place], DO [facility] Pager [phone]  If 7PM-7AM, please contact night-coverage [email or web address] Password [email or web address] 01/03/2019, 10:35 AM

## 2019-01-03 NOTE — Progress Notes (Signed)
Occupational Therapy Evaluation Patient Details Name: Lori PateDoris B Lagace MRN: [id number] DOB: 04/25/1921 Today's Date: 01/03/2019    History of Present Illness Pt is a 85 y.o. female admitted 01/01/19 after fall sustaining R hip fx. S/p R hip IM nail 7/16. PMH includes DM, HTN, lumbar DDD.   Clinical Impression   Pt presents with above diagnosis. PTA pt PLOF requiring 24 hour care in home setting. Pt poor historian, retrieved information from chart review. Pt currently demonstrates decreased ADL function due to pain, cognitive deficit and, weakness. Pt demonstrated grooming task in static sitting at EOB with support provided, VCs and hand over hand assistance required to perform task. Pt will benefit from continued acute therapy to maximize independence and for safety in ADLs prior to d/c setting.     Follow Up Recommendations  SNF;Supervision/Assistance - 24 hour    Equipment Recommendations  3 in 1 bedside commode    Recommendations for Other Services       Precautions / Restrictions Precautions Precautions: Fall Restrictions Weight Bearing Restrictions: Yes RLE Weight Bearing: Weight bearing as tolerated      Mobility Bed Mobility Overal bed mobility: Needs Assistance Bed Mobility: Supine to Sit;Sit to Supine     Supine to sit: Max assist;+2 for physical assistance Sit to supine: Max assist;+2 for physical assistance   General bed mobility comments: Pt c/o pain, actively resisting RLE movement; maxA+2 to assist BLEs and trunk elevation  Transfers Overall transfer level: Needs assistance Equipment used: Rolling walker (2 wheeled) Transfers: Sit to/from Stand Sit to Stand: Max assist;+2 physical assistance         General transfer comment: MaxA+2 to assist trunk elevation, move hands to RW and prevent posterior LOB    Balance Overall balance assessment: Needs assistance   Sitting balance-Leahy Scale: Poor Sitting balance - Comments: Brief periods of static sitting  with min guard, quickly requiring min-modA to maintain upright posture, especially when performing ADL task     Standing balance-Leahy Scale: Zero Standing balance comment: MaxA+2 and UE support to maintain standing balance                           ADL either performed or assessed with clinical judgement   ADL Overall ADL's : Needs assistance/impaired Eating/Feeding: Bed level;Moderate assistance   Grooming: Bed level;Moderate assistance   Upper Body Bathing: Moderate assistance;Sitting   Lower Body Bathing: Maximal assistance   Upper Body Dressing : Moderate assistance;Sitting   Lower Body Dressing: Maximal assistance     Toilet Transfer Details (indicate cue type and reason): simulated transfer deferred due to level of function and weakness.           General ADL Comments: Pt demonstrated brushing hair while seated at EOB in supported sitting. Pt required several cues to guide for motion of task.      Vision         Perception     Praxis      Pertinent Vitals/Pain Pain Assessment: Faces Faces Pain Scale: Hurts even more Pain Location: RLE Pain Descriptors / Indicators: Guarding;Grimacing Pain Intervention(s): Monitored during session;Repositioned     Hand Dominance Right   Extremity/Trunk Assessment Upper Extremity Assessment Upper Extremity Assessment: Generalized weakness   Lower Extremity Assessment Lower Extremity Assessment: Defer to PT evaluation RLE Deficits / Details: s/p R femoral IM nail; actively resisting RLE PROM RLE: Unable to fully assess due to pain   Cervical / Trunk Assessment Cervical / Trunk  Assessment: Kyphotic   Communication     Cognition Arousal/Alertness: Awake/alert Behavior During Therapy: WFL for tasks assessed/performed Overall Cognitive Status: History of cognitive impairments - at baseline                                 General Comments: Baseline dementia. Pt pleasantly confused, A&O to  self but also aware she broke hip, reports surgeon hasn't fixed it yet. Difficult to direct and reorient   General Comments       Exercises     Shoulder Instructions      Home Living Family/patient expects to be discharged to:: Skilled nursing facility Living Arrangements: Alone Available Help at Discharge: Personal care attendant;Available 24 hours/day                             Additional Comments: Per son, pt lives alone with 24/7 caregiver. Pushes w/c for balance, becoming more dependent on w/c for mobility      Prior Functioning/Environment Level of Independence: Needs assistance  Gait / Transfers Assistance Needed: Pushes w/c for balance, becoming more dependent on w/c for mobility. 24/7 caregiver likely assists with all mobility ADL's / Homemaking Assistance Needed: 24/7 caregiver likely assists with all ADLs            OT Problem List: Decreased activity tolerance;Impaired balance (sitting and/or standing);Decreased cognition;Decreased safety awareness;Decreased knowledge of use of DME or AE;Pain      OT Treatment/Interventions:      OT Goals(Current goals can be found in the care plan section) Acute Rehab OT Goals Patient Stated Goal: Go to the beach OT Goal Formulation: Patient unable to participate in goal setting Time For Goal Achievement: 01/17/19 Potential to Achieve Goals: Fair  OT Frequency: Min 2X/week   Barriers to D/C:            Co-evaluation PT/OT/SLP Co-Evaluation/Treatment: Yes Reason for Co-Treatment: Necessary to address cognition/behavior during functional activity;Complexity of the patient's impairments (multi-system involvement);For patient/therapist safety;To address functional/ADL transfers   OT goals addressed during session: ADL's and self-care;Strengthening/ROM      AM-PAC OT "6 Clicks" Daily Activity     Outcome Measure Help from another person eating meals?: A Little Help from another person taking care of personal  grooming?: A Little Help from another person toileting, which includes using toliet, bedpan, or urinal?: A Lot Help from another person bathing (including washing, rinsing, drying)?: A Lot Help from another person to put on and taking off regular upper body clothing?: A Little Help from another person to put on and taking off regular lower body clothing?: A Lot 6 Click Score: 15   End of Session Nurse Communication: Mobility status;Weight bearing status  Activity Tolerance: Patient limited by lethargy(lethargic towards end of session.) Patient left: in bed;with call bell/phone within reach;with bed alarm set;with restraints reapplied  OT Visit Diagnosis: Unsteadiness on feet (R26.81);Repeated falls (R29.6);Muscle weakness (generalized) (M62.81);Pain Pain - Right/Left: Right Pain - part of body: Hip                Time: 1040-1102 OT Time Calculation (min): 22 min Charges:  OT General Charges $OT Visit: 1 Visit OT Evaluation $OT Eval Moderate Complexity: [place], MSOT, OTR/L  Supplemental Rehabilitation Services  [phone]   Marius Ditch 01/03/2019, 11:59 AM

## 2019-01-03 NOTE — TOC Initial Note (Signed)
Transition of Care [facility]) - Initial/Assessment Note    Patient Details  Name: Lori Ellis MRN: [id number] Date of Birth: 06-Aug-1920  Transition of Care [facility]) CM/SW Contact:    [name], RN Phone Number: 01/03/2019, 4:37 PM  Clinical Narrative:                 Spoke with son, Fritz Pickerel, who states he and other family members support patient to live at her home. She has a Optician, dispensing 24 hours/day and usually requires 1 person assist for ADLs.  Fritz Pickerel is supportive of patient going to SNF and prefers [facility] since she has been there before. Son has also considered [facility] on [employer]. He gives permission to fax out to any facility that may have availability.  He will review facilities when she is accepted.    Expected Discharge Plan: [place]     Patient Goals and CMS Choice Patient states their goals for this hospitalization and ongoing recovery are:: to get back home CMS Medicare.gov Compare Post Acute Care list provided to:: Patient Represenative (must comment) Choice offered to / list presented to : Adult Children  Expected Discharge Plan and Services Expected Discharge Plan: [place]   Discharge Planning Services: CM Consult Post Acute Care Choice: [place] Living arrangements for the past 2 months: Single Family Home                     Prior Living Arrangements/Services Living arrangements for the past 2 months: Single Family Home Lives with:: Other (Comment)(Live-in personal care assistant through "[facility]") Patient language and need for interpreter reviewed:: Yes Do you feel safe going back to the place where you live?: No      Need for Family Participation in Patient Care: Yes (Comment) Care giver support system in place?: Yes (comment) Current home services: DME, Housekeeping, Actuary Criminal Activity/Legal Involvement Pertinent to Current Situation/Hospitalization: No - Comment as  needed  Activities of Daily Living Home Assistive Devices/Equipment: None ADL Screening (condition at time of admission) Patient's cognitive ability adequate to safely complete daily activities?: Yes Is the patient deaf or have difficulty hearing?: No Does the patient have difficulty seeing, even when wearing glasses/contacts?: No Does the patient have difficulty concentrating, remembering, or making decisions?: Yes Patient able to express need for assistance with ADLs?: Yes Does the patient have difficulty dressing or bathing?: Yes Independently performs ADLs?: No Does the patient have difficulty walking or climbing stairs?: Yes Weakness of Legs: Right Weakness of Arms/Hands: None  Permission Sought/Granted Permission sought to share information with : Chartered certified accountant granted to share information with : Yes, Verbal Permission Granted     Permission granted to share info w AGENCY: [facility], [facility], any other that may have availability        Emotional Assessment       Orientation: : Oriented to Self Alcohol / Substance Use: Not Applicable Psych Involvement: No (comment)  Admission diagnosis:  Preop exam for internal medicine [Z01.818] Closed nondisplaced intertrochanteric fracture of right femur, initial encounter [facility]) [S72.144A] Patient Active Problem List   Diagnosis Date Noted  . Closed intertrochanteric fracture, right, initial encounter ([place]) 01/01/2019  . Atrial flutter by electrocardiogram ([place]) 01/01/2019  . Benign essential HTN   . Spinal stenosis, lumbar 06/12/2017  . Chronic bilateral low back pain with bilateral sciatica 03/07/2016  . Spondylolisthesis of lumbar region 03/07/2016  . CKD (chronic kidney disease) stage 3, GFR 30-59 ml/min (HCC)  03/12/2014  . Hypothyroidism 09/02/2013  . Type II diabetes mellitus with renal manifestations (HCC) 09/02/2013  . DDD (degenerative disc disease), lumbosacral 09/01/2013   PCP:  Malka SoJobe,  Daniel B., MD Pharmacy:   [facility] [id number] [place] - [phone] [facility] [address] [place] [id number] Phone: [phone] Fax: [phone]

## 2019-01-03 NOTE — Evaluation (Signed)
Physical Therapy Evaluation Patient Details Name: EMSLEE Ellis MRN: [id number] DOB: 11-11-1920 Today's Date: 01/03/2019   History of Present Illness  Pt is a 85 y.o. female admitted 01/01/19 after fall sustaining R hip fx. S/p R hip IM nail 7/16. PMH includes DM, HTN, lumbar DDD.    Clinical Impression  Pt presents with an overall decrease in functional mobility secondary to above. Pt poor historian; per chart, lives at home alone with 24/7 caregiver, pushes w/c for mobility. Today, pt requiring maxA+2 for bed mobility and standing with RW; pt pleasantly confused throughout session. Pt would benefit from continued acute PT services to maximize functional mobility and independence prior to d/c with SNF-level therapies.     Follow Up Recommendations SNF;Supervision/Assistance - 24 hour    Equipment Recommendations  (TBD next venue)    Recommendations for Other Services       Precautions / Restrictions Precautions Precautions: Fall Restrictions Weight Bearing Restrictions: Yes RLE Weight Bearing: Weight bearing as tolerated      Mobility  Bed Mobility Overal bed mobility: Needs Assistance Bed Mobility: Supine to Sit;Sit to Supine     Supine to sit: Max assist;+2 for physical assistance Sit to supine: Max assist;+2 for physical assistance   General bed mobility comments: Pt c/o pain, actively resisting RLE movement; maxA+2 to assist BLEs and trunk elevation  Transfers Overall transfer level: Needs assistance Equipment used: Rolling walker (2 wheeled) Transfers: Sit to/from Stand Sit to Stand: Max assist;+2 physical assistance         General transfer comment: MaxA+2 to assist trunk elevation, move hands to RW and prevent posterior LOB  Ambulation/Gait             General Gait Details: Unable  Stairs            Wheelchair Mobility    Modified Rankin (Stroke Patients Only)       Balance Overall balance assessment: Needs assistance   Sitting  balance-Leahy Scale: Poor Sitting balance - Comments: Brief periods of static sitting with min guard, quickly requiring min-modA to maintain upright posture, especially when performing ADL task     Standing balance-Leahy Scale: Zero Standing balance comment: MaxA+2 and UE support to maintain standing balance                             Pertinent Vitals/Pain Pain Assessment: Faces Faces Pain Scale: Hurts even more Pain Location: RLE Pain Descriptors / Indicators: Guarding;Grimacing Pain Intervention(s): Monitored during session;Repositioned    Home Living Family/patient expects to be discharged to:: Skilled nursing facility Living Arrangements: Alone Available Help at Discharge: Personal care attendant;Available 24 hours/day             Additional Comments: Per son, pt lives alone with 24/7 caregiver. Pushes w/c for balance, becoming more dependent on w/c for mobility    Prior Function Level of Independence: Needs assistance   Gait / Transfers Assistance Needed: Pushes w/c for balance, becoming more dependent on w/c for mobility. 24/7 caregiver likely assists with all mobility  ADL's / Homemaking Assistance Needed: 24/7 caregiver likely assists with all ADLs        Hand Dominance        Extremity/Trunk Assessment   Upper Extremity Assessment Upper Extremity Assessment: Generalized weakness    Lower Extremity Assessment Lower Extremity Assessment: Generalized weakness;RLE deficits/detail;Difficult to assess due to impaired cognition RLE Deficits / Details: s/p R femoral IM nail; actively resisting RLE PROM  RLE: Unable to fully assess due to pain    Cervical / Trunk Assessment Cervical / Trunk Assessment: Kyphotic  Communication      Cognition Arousal/Alertness: Awake/alert Behavior During Therapy: WFL for tasks assessed/performed Overall Cognitive Status: History of cognitive impairments - at baseline                                  General Comments: Baseline dementia. Pt pleasantly confused, A&O to self but also aware she broke hip, reports surgeon hasn't fixed it yet. Difficult to direct and reorient      General Comments      Exercises     Assessment/Plan    PT Assessment Patient needs continued PT services  PT Problem List Decreased strength;Decreased range of motion;Decreased activity tolerance;Decreased balance;Decreased mobility;Decreased cognition;Decreased knowledge of use of DME;Decreased safety awareness;Decreased knowledge of precautions;Pain       PT Treatment Interventions DME instruction;Gait training;Functional mobility training;Therapeutic activities;Therapeutic exercise;Balance training;Patient/family education;Wheelchair mobility training    PT Goals (Current goals can be found in the Care Plan section)  Acute Rehab PT Goals Patient Stated Goal: Go to the beach PT Goal Formulation: With patient Time For Goal Achievement: 01/17/19 Potential to Achieve Goals: Fair    Frequency Min 3X/week   Barriers to discharge        Co-evaluation PT/OT/SLP Co-Evaluation/Treatment: Yes Reason for Co-Treatment: Necessary to address cognition/behavior during functional activity;For patient/therapist safety;To address functional/ADL transfers PT goals addressed during session: Mobility/safety with mobility;Balance         AM-PAC PT "6 Clicks" Mobility  Outcome Measure Help needed turning from your back to your side while in a flat bed without using bedrails?: Total Help needed moving from lying on your back to sitting on the side of a flat bed without using bedrails?: Total Help needed moving to and from a bed to a chair (including a wheelchair)?: Total Help needed standing up from a chair using your arms (e.g., wheelchair or bedside chair)?: Total Help needed to walk in hospital room?: Total Help needed climbing 3-5 steps with a railing? : Total 6 Click Score: 6    End of Session   Activity  Tolerance: Patient tolerated treatment well Patient left: in bed;with call bell/phone within reach;with bed alarm set Nurse Communication: Mobility status PT Visit Diagnosis: Other abnormalities of gait and mobility (R26.89);Muscle weakness (generalized) (M62.81)    Time: [id number] PT Time Calculation (min) (ACUTE ONLY): 24 min   Charges:   PT Evaluation $PT Eval Moderate Complexity: 1 Mod     Lori Ellis, PT, DPT Acute Rehabilitation Services  Pager [phone] Office [phone]  Lori Ellis 01/03/2019, 11:35 AM

## 2019-01-04 DIAGNOSIS — S72144A Nondisplaced intertrochanteric fracture of right femur, initial encounter for closed fracture: Secondary | ICD-10-CM

## 2019-01-04 LAB — CBC
HCT: 33 % — ABNORMAL LOW (ref 36.0–46.0)
Hemoglobin: 10.5 g/dL — ABNORMAL LOW (ref 12.0–15.0)
MCH: 31.8 pg (ref 26.0–34.0)
MCHC: 31.8 g/dL (ref 30.0–36.0)
MCV: 100 fL (ref 80.0–100.0)
Platelets: 189 10*3/uL (ref 150–400)
RBC: 3.3 MIL/uL — ABNORMAL LOW (ref 3.87–5.11)
RDW: 12.6 % (ref 11.5–15.5)
WBC: 18.7 10*3/uL — ABNORMAL HIGH (ref 4.0–10.5)
nRBC: 0 % (ref 0.0–0.2)

## 2019-01-04 LAB — GLUCOSE, CAPILLARY
Glucose-Capillary: 120 mg/dL — ABNORMAL HIGH (ref 70–99)
Glucose-Capillary: 133 mg/dL — ABNORMAL HIGH (ref 70–99)
Glucose-Capillary: 148 mg/dL — ABNORMAL HIGH (ref 70–99)
Glucose-Capillary: 135 mg/dL — ABNORMAL HIGH (ref 70–99)
Glucose-Capillary: 166 mg/dL — ABNORMAL HIGH (ref 70–99)

## 2019-01-04 LAB — BASIC METABOLIC PANEL
Anion gap: 13 (ref 5–15)
BUN: 41 mg/dL — ABNORMAL HIGH (ref 8–23)
CO2: 22 mmol/L (ref 22–32)
Calcium: 8.7 mg/dL — ABNORMAL LOW (ref 8.9–10.3)
Chloride: 106 mmol/L (ref 98–111)
Creatinine, Ser: 1.59 mg/dL — ABNORMAL HIGH (ref 0.44–1.00)
GFR calc Af Amer: 32 mL/min — ABNORMAL LOW (ref >60)
GFR calc non Af Amer: 27 mL/min — ABNORMAL LOW (ref >60)
Glucose, Bld: 158 mg/dL — ABNORMAL HIGH (ref 70–99)
Potassium: 3.6 mmol/L (ref 3.5–5.1)
Sodium: 141 mmol/L (ref 135–145)

## 2019-01-04 LAB — URINALYSIS, ROUTINE W REFLEX MICROSCOPIC
Bilirubin Urine: NEGATIVE
Glucose, UA: NEGATIVE mg/dL
Ketones, ur: 5 mg/dL — AB
Nitrite: NEGATIVE
Protein, ur: 100 mg/dL — AB
Specific Gravity, Urine: 1.021 (ref 1.005–1.030)
WBC, UA: >50 WBC/hpf — ABNORMAL HIGH (ref 0–5)
pH: 5 (ref 5.0–8.0)

## 2019-01-04 LAB — MAGNESIUM: Magnesium: 2.2 mg/dL (ref 1.7–2.4)

## 2019-01-04 MED ORDER — LACTATED RINGERS IV SOLN
INTRAVENOUS | Status: AC
  Administered 2019-01-04 – 2019-01-05 (×2): via INTRAVENOUS

## 2019-01-04 MED ORDER — HALOPERIDOL LACTATE 5 MG/ML IJ SOLN: 2.0000 mg | Freq: Four times a day (QID) | INTRAMUSCULAR | Status: DC | PRN

## 2019-01-04 NOTE — Plan of Care (Signed)

## 2019-01-04 NOTE — Plan of Care (Signed)
  Problem: Nutrition: Goal: Adequate nutrition will be maintained Outcome: Progressing   Problem: Pain Managment: Goal: General experience of comfort will improve Outcome: Progressing   Problem: Skin Integrity: Goal: Risk for impaired skin integrity will decrease Outcome: Progressing   

## 2019-01-04 NOTE — Progress Notes (Addendum)
Orthopedic progress note  Patient ID: Lori Ellis MRN: [id number] DOB/AGE: 1921-04-21 85 y.o.  Subjective:  Appears comfortable.  Sleeping soundly on arrival and agitated when waking is attempted.  At baseline pt lives alone but has a caregiver She uses a wheelchair essentially as a walker to ambulate   ROS: Patient not cooperative/interactive active with exam  Objective:   VITALS:   Vitals:   01/03/19 1728 01/03/19 1948 01/04/19 0436 01/04/19 0757  BP: 112/73 (!) 143/53 (!) 107/59 (!) 123/39  Pulse: 85 (!) 43 (!) 46 67  Resp:  18 18 16   Temp:  98.4 F (36.9 C) 98.5 F (36.9 C) 98.3 F (36.8 C)  TempSrc:  Oral Oral Oral  SpO2:  100% 92% 95%  Weight:      Height:        Estimated body mass index is 21.26 kg/m as calculated from the following:   Height as of this encounter: 5\' 3"  (1.6 m).   Weight as of this encounter: 54.4 kg.   Intake/Output      07/17 0701 - 07/18 0700 07/18 0701 - 07/19 0700   P.O.     I.V. (mL/kg)     Total Intake(mL/kg)     Urine (mL/kg/hr)     Blood     Total Output     Net          Stool Occurrence  1 x     LABS  Results for orders placed or performed during the hospital encounter of 01/01/19 (from the past 24 hour(s))  Glucose, capillary     Status: Abnormal   Collection Time: 01/03/19 11:18 AM  Result Value Ref Range   Glucose-Capillary 136 (H) 70 - 99 mg/dL  Glucose, capillary     Status: Abnormal   Collection Time: 01/03/19  4:50 PM  Result Value Ref Range   Glucose-Capillary 126 (H) 70 - 99 mg/dL  CBC     Status: Abnormal   Collection Time: 01/04/19  5:24 AM  Result Value Ref Range   WBC 18.7 (H) 4.0 - 10.5 K/uL   RBC 3.30 (L) 3.87 - 5.11 MIL/uL   Hemoglobin 10.5 (L) 12.0 - 15.0 g/dL   HCT 33.0 (L) 36.0 - 46.0 %   MCV 100.0 80.0 - 100.0 fL   MCH 31.8 26.0 - 34.0 pg   MCHC 31.8 30.0 - 36.0 g/dL   RDW 12.6 11.5 - 15.5 %   Platelets 189 150 - 400 K/uL   nRBC 0.0 0.0 - 0.2 %  Basic metabolic panel     Status:  Abnormal   Collection Time: 01/04/19  5:24 AM  Result Value Ref Range   Sodium 141 135 - 145 mmol/L   Potassium 3.6 3.5 - 5.1 mmol/L   Chloride 106 98 - 111 mmol/L   CO2 22 22 - 32 mmol/L   Glucose, Bld 158 (H) 70 - 99 mg/dL   BUN 41 (H) 8 - 23 mg/dL   Creatinine, Ser 1.59 (H) 0.44 - 1.00 mg/dL   Calcium 8.7 (L) 8.9 - 10.3 mg/dL   GFR calc non Af Amer 27 (L) >60 mL/min   GFR calc Af Amer 32 (L) >60 mL/min   Anion gap 13 5 - 15  Magnesium     Status: None   Collection Time: 01/04/19  5:24 AM  Result Value Ref Range   Magnesium 2.2 1.7 - 2.4 mg/dL  Glucose, capillary     Status: Abnormal   Collection Time: 01/04/19  9:03 AM  Result Value Ref Range   Glucose-Capillary 120 (H) 70 - 99 mg/dL     PHYSICAL EXAM:  General:  Sleeping soundly.  Appears comfortable.  Breakfast tray untouched at bedside.  Agitated when attempt is made to wake her up.  No increased work of breathing. MSK: RLE: Dressing right hip moderate bloody drainage-changed.  New middle dressing placed.  Distal incision covered by thigh-high TED hose CDI Feet warm Minimal swelling.  Compartments soft. Not cooperative with motor and sensory exam, but dorsiflexion flexion intact   Assessment/Plan: 2 Days Post-Op   Active Problems:   CKD (chronic kidney disease) stage 3, GFR 30-59 ml/min (HCC)   Hypothyroidism   Type II diabetes mellitus with renal manifestations (HCC)   Benign essential HTN   Closed intertrochanteric fracture, right, initial encounter (HCC)   Atrial flutter by electrocardiogram (HCC)   Anti-infectives (From admission, onward)   Start     Dose/Rate Route Frequency Ordered Stop   01/02/19 1545  clindamycin (CLEOCIN) IVPB 600 mg     600 mg 100 mL/hr over 30 Minutes Intravenous Every 6 hours 01/02/19 1541 01/03/19 0012   01/02/19 1215  clindamycin (CLEOCIN) IVPB 600 mg  Status:  Discontinued     600 mg 100 mL/hr over 30 Minutes Intravenous On call to O.R. 01/02/19 [id number] 01/03/19 0559     .  85 y/o female s/p fall with R 4 part IT femur fracture   Status post IM nail by Dr. Carola FrostHandy 01/02/2019  POD 2  - ABL anemia/Hemodynamics  Hemoglobin stable   Seems a bit dry.  Not likely getting adequate p.o.   - Medical issues              Per primary    - ID:              periop abx completed    - Metabolic Bone Disease:             Mechanism and fracture indicative of osteoporosis    Continue with vitamin d and calcium   Optimize nutrition    - FEN/GI prophylaxis/Foley/Lines:             RD consulted    Weightbearing: WBAT RLE.  ROM as tolerated. Insicional and dressing care: Daily dressing changes with 4x4's and tape Orthopedic device(s): walker Showering: ok to shower and clean wounds with soap and water only  VTE prophylaxis: Lovenox 30 days  Pain control: Minimize narcotics.  Tylenol.  Norco for severe pain  Follow - up plan: 3 weeks with Dr. Carola FrostHandy  Dispo: Therapy evaluations ongoing.  Likely SNF.  [facility]  Albina BilletHenry Calvin Martensen III, PA-C 01/04/2019 11:08 AM

## 2019-01-04 NOTE — Progress Notes (Signed)
PROGRESS NOTE    Lori PateDoris B Kennerly  EAV:[id number] DOB: 05/06/1921 DOA: 01/01/2019 PCP: Malka SoJobe, Daniel B., MD    Brief Narrative:   Lori Ellis is a 85 y.o. female with medical history significant of non-insulin-dependent diabetes, degenerative disc disease lumbar spine, hypertension.  Patient has been living in her own home but has 24/7 care.  She is mobile in a wheelchair and does use a walker but has been less mobile over time according to her son.  Patient was at home in her usual state of health when she had a mechanical fall striking her head and her hip.  She had no apparent loss of consciousness.  Because of her significant pain following her fall as well as a laceration to her occiput she presented to [facility] emergency department for evaluation.  She is now admitted for intertrochanteric fracture right hip patient has significant pain at rest and worse pain with movement.  He has been restless and uncomfortable.    ED Course: Evaluation in the emergency department revealed the patient to have a laceration to the right occiput.  This was repaired with interrupted sutures.  She had a CT scan of the head which was unremarkable except for cerebral atrophy.  Patient did have x-rays of the pelvis and hip and was found to have an intertrochanteric fracture of the right hip.  She developed poor bone mineralization was raised in that report.  Patient was referred for admission for fractured hip with operative repair planned after orthopedic consult in the a.m.   Subjective: Patient in bed, agitated, denies any headache chest or abdominal pain.  Unreliable historian.  Assessment & Plan:    Right intertrochanteric hip fracture s/p IMN Patient presenting from home with decreased ability to ambulate with right-sided hip pain.  X-ray on admission with findings of right intertrochanteric hip fracture.  Patient underwent IMN to right intertrochanteric hip fracture on 01/02/2019 by Dr. Carola FrostHandy.  --Weightbearing as tolerated, with a walker --Lovenox 30 mg subcutaneously every 24 hours x 30 days per orthopedics --Fall precautions --Continue pain management with Norco, morphine prn --Pending PT/OT evaluation --Will need SNF placement, SW/CM for coordination  Essential hypertension Blood pressure well controlled 138/74 this morning. --Continue home amlodipine 2.5 mg p.o. daily, lisinopril 20 mg p.o. daily, propranolol 20 mg p.o. daily --Continue monitor blood pressure closely  Hypothyroidism: Continue levothyroxine 75 mcg p.o. daily  Acute hospital-acquired delirium Patient overnight with increased confusion with agitation.  Likely secondary to advanced age, possible underlying dementia, and active pain associated with right hip fracture. --Continue restraints to preclude removal of IV catheters and removal of surgical wound dressing --Delirium precautions, blinds open during the day/closed at night, TV off and quiet at night --Melatonin 3 mg p.o. nightly -PRN Haldol added.     AKI.  Due to dehydration.  Hydrate.  Leukocytosis.  Could be reactive, check UA when possible, bladder scan stable.  No cough or signs of pneumonia.  For now afebrile.    DVT prophylaxis: Lovenox Code Status: DNR Family Communication: None Disposition Plan: Inpatient hospitalization, likely will need SNF on discharge   Consultants:   Orthopedic surgery, Dr. Carola FrostHandy  Procedures:   IMN 01/02/2019 by Dr. Carola FrostHandy  Antimicrobials:   Perioperative clindamycin     Objective: Vitals:   01/03/19 1728 01/03/19 1948 01/04/19 0436 01/04/19 0757  BP: 112/73 (!) 143/53 (!) 107/59 (!) 123/39  Pulse: 85 (!) 43 (!) 46 67  Resp:  18 18 16   Temp:  98.4 F (  36.9 C) 98.5 F (36.9 C) 98.3 F (36.8 C)  TempSrc:  Oral Oral Oral  SpO2:  100% 92% 95%  Weight:      Height:       No intake or output data in the 24 hours ending 01/04/19 1245 Filed Weights   01/01/19 1436  Weight: 54.4 kg     Examination:  Awake but confused, agitated affect, moving all 4 extremities,  [place].AT,PERRAL Supple Neck,No JVD, No cervical lymphadenopathy appriciated.  Symmetrical Chest wall movement, Good air movement bilaterally, CTAB RRR,No Gallops, Rubs or new Murmurs, No Parasternal Heave +ve B.Sounds, Abd Soft, No tenderness, No organomegaly appriciated, No rebound - guarding or rigidity. No Cyanosis, Clubbing or edema, No new Rash or bruise     Data Reviewed: I have personally reviewed following labs and imaging studies  CBC: Recent Labs  Lab 01/01/19 1445 01/02/19 0859 01/03/19 0426 01/04/19 0524  WBC 9.0 12.8* 16.7* 18.7*  NEUTROABS 5.7  --   --   --   HGB 12.3 12.7 10.9* 10.5*  HCT 38.9 38.2 33.4* 33.0*  MCV 101.3* 97.7 98.5 100.0  PLT 191 196 198 712   Basic Metabolic Panel: Recent Labs  Lab 01/01/19 1445 01/02/19 0859 01/03/19 0426 01/04/19 0524  NA 141 142 140 141  K 4.0 3.4* 3.5 3.6  CL 103 101 104 106  CO2 29 26 24 22   GLUCOSE 166* 160* 154* 158*  BUN 14 16 23  41*  CREATININE 1.06* 1.08* 1.11* 1.59*  CALCIUM 9.1 9.4 8.9 8.7*  MG  --   --   --  2.2   GFR: Estimated Creatinine Clearance: 17.5 mL/min (A) (by C-G formula based on SCr of 1.59 mg/dL (H)). Liver Function Tests: No results for input(s): AST, ALT, ALKPHOS, BILITOT, PROT, ALBUMIN in the last 168 hours. No results for input(s): LIPASE, AMYLASE in the last 168 hours. No results for input(s): AMMONIA in the last 168 hours. Coagulation Profile: Recent Labs  Lab 01/01/19 1445 01/02/19 0859  INR 1.0 1.0   Cardiac Enzymes: No results for input(s): CKTOTAL, CKMB, CKMBINDEX, TROPONINI in the last 168 hours. BNP (last 3 results) No results for input(s): PROBNP in the last 8760 hours. HbA1C: Recent Labs    01/01/19 1941  HGBA1C 5.9*   CBG: Recent Labs  Lab 01/03/19 0800 01/03/19 1118 01/03/19 1650 01/04/19 0903 01/04/19 1144  GLUCAP 150* 136* 126* 120* 133*   Lipid Profile: No results for  input(s): CHOL, HDL, LDLCALC, TRIG, CHOLHDL, LDLDIRECT in the last 72 hours. Thyroid Function Tests: No results for input(s): TSH, T4TOTAL, FREET4, T3FREE, THYROIDAB in the last 72 hours. Anemia Panel: No results for input(s): VITAMINB12, FOLATE, FERRITIN, TIBC, IRON, RETICCTPCT in the last 72 hours. Sepsis Labs: No results for input(s): PROCALCITON, LATICACIDVEN in the last 168 hours.  Recent Results (from the past 240 hour(s))  SARS Coronavirus 2 (CEPHEID - Performed in [facility] hospital lab), Hosp Order     Status: None   Collection Time: 01/01/19  7:01 PM   Specimen: Nasopharyngeal Swab  Result Value Ref Range Status   SARS Coronavirus 2 NEGATIVE NEGATIVE Final    Comment: (NOTE) If result is NEGATIVE SARS-CoV-2 target nucleic acids are NOT DETECTED. The SARS-CoV-2 RNA is generally detectable in upper and lower  respiratory specimens during the acute phase of infection. The lowest  concentration of SARS-CoV-2 viral copies this assay can detect is 250  copies / mL. A negative result does not preclude SARS-CoV-2 infection  and should not be  used as the sole basis for treatment or other  patient management decisions.  A negative result may occur with  improper specimen collection / handling, submission of specimen other  than nasopharyngeal swab, presence of viral mutation(s) within the  areas targeted by this assay, and inadequate number of viral copies  (<250 copies / mL). A negative result must be combined with clinical  observations, patient history, and epidemiological information. If result is POSITIVE SARS-CoV-2 target nucleic acids are DETECTED. The SARS-CoV-2 RNA is generally detectable in upper and lower  respiratory specimens dur ing the acute phase of infection.  Positive  results are indicative of active infection with SARS-CoV-2.  Clinical  correlation with patient history and other diagnostic information is  necessary to determine patient infection status.   Positive results do  not rule out bacterial infection or co-infection with other viruses. If result is PRESUMPTIVE POSTIVE SARS-CoV-2 nucleic acids MAY BE PRESENT.   A presumptive positive result was obtained on the submitted specimen  and confirmed on repeat testing.  While 2019 novel coronavirus  (SARS-CoV-2) nucleic acids may be present in the submitted sample  additional confirmatory testing may be necessary for epidemiological  and / or clinical management purposes  to differentiate between  SARS-CoV-2 and other Sarbecovirus currently known to infect humans.  If clinically indicated additional testing with an alternate test  methodology [phone] is advised. The SARS-CoV-2 RNA is generally  detectable in upper and lower respiratory sp ecimens during the acute  phase of infection. The expected result is Negative. Fact Sheet for Patients:  BoilerBrush.com.cyhttps://www.fda.gov/media/136312/download Fact Sheet for Healthcare Providers: https://pope.com/https://www.fda.gov/media/136313/download This test is not yet approved or cleared by the [place] FDA and has been authorized for detection and/or diagnosis of SARS-CoV-2 by FDA under an Emergency Use Authorization (EUA).  This EUA will remain in effect (meaning this test can be used) for the duration of the COVID-19 declaration under Section 564(b)(1) of the Act, 21 U.S.C. section 360bbb-3(b)(1), unless the authorization is terminated or revoked sooner. Performed at [facility], [address] [place], [place] [address]   Surgical pcr screen     Status: Abnormal   Collection Time: 01/01/19  9:17 PM   Specimen: Nasal Mucosa; Nasal Swab  Result Value Ref Range Status   MRSA, PCR NEGATIVE NEGATIVE Final   Staphylococcus aureus POSITIVE (A) NEGATIVE Final    Comment: (NOTE) The Xpert SA Assay (FDA approved for NASAL specimens in patients 83 years of age and older), is one component of a comprehensive surveillance program. It is not intended to diagnose  infection nor to guide or monitor treatment. Performed at [facility], [address] [place], [place] [address]          Radiology Studies: Pelvis Portable  Result Date: 01/02/2019 CLINICAL DATA:  Postoperative radiograph, right hip fracture. EXAM: PORTABLE PELVIS 1-2 VIEWS COMPARISON:  Radiographs 1 day prior FINDINGS: Postsurgical changes from intramedullary nail and transcervical fixation screw placement of the right femur. Persistent displacement of the fracture fragment containing the lesser trochanter. Expected postsurgical soft tissue changes including soft tissue gas and air within the joint. No unexpected radiopaque foreign body. Femoral heads remain normally located. Severe degenerative changes are noted elsewhere throughout the left hip and pelvis similar to prior exam. IMPRESSION: Expected postsurgical changes following intramedullary nail placement of the right femur. Electronically Signed   By: Kreg ShropshirePrice  DeHay M.D.   On: 01/02/2019 19:33   Dg C-arm 1-60 Min  Result Date: 01/02/2019 CLINICAL  DATA:  ORIF of right hip EXAM: DG C-ARM 61-120 MIN COMPARISON:  None. FINDINGS: ORIF of right hip with a gamma nail, intramedullary rod, and a distal interlocking screw. IMPRESSION: ORIF of right hip fracture. Electronically Signed   By: Gerome Samavid  Williams III M.D   On: 01/02/2019 15:44   Dg Hip Operative Unilat W Or W/o Pelvis Right  Result Date: 01/02/2019 CLINICAL DATA:  ORIF right hip. FLUOROSCOPY TIME:  88.7 seconds. Images: 5 EXAM: OPERATIVE RIGHT HIP (WITH PELVIS IF PERFORMED) 5 VIEWS TECHNIQUE: Fluoroscopic spot image(s) were submitted for interpretation post-operatively. COMPARISON:  None. FINDINGS: A gamma nail and intramedullary femoral rod are placed across the right hip fracture with a distal interlocking screw. Hardware is in good position. IMPRESSION: Right hip fracture repair as above. Electronically Signed   By: Gerome Samavid  Williams III M.D   On: 01/02/2019 15:38   Dg Femur  Port, Min 2 Views Right  Result Date: 01/02/2019 CLINICAL DATA:  Postoperative evaluation, open reduction internal fixation of the right hip fracture. EXAM: RIGHT FEMUR PORTABLE 2 VIEW COMPARISON:  Radiographs January 01, 2019. FINDINGS: Expected postsurgical changes following placement of a right femoral intramedullary nail and transcervical fixation screw. Distal fixation screw noted at the level of the distal femoral diaphysis as well. No evidence of a prosthetic failure or immediate postoperative complication. Stable displacement of the inter trochanteric fracture fragment containing the lesser trochanter of the femur. Soft tissue gas and air within the right hip joint expected postoperatively. There is vascular calcification in the medial thigh. Degenerative changes are present at the right hip and pelvis, unchanged from prior studies. No unexpected radiopaque foreign body. IMPRESSION: Expected postoperative appearance following open reduction, internal fixation of the right femur fracture. Electronically Signed   By: Kreg ShropshirePrice  DeHay M.D.   On: 01/02/2019 19:36        Scheduled Meds: . acetaminophen  500 mg Oral Q6H  . amLODipine  2.5 mg Oral QHS  . cholecalciferol  2,000 Units Oral BID  . docusate sodium  100 mg Oral BID  . enoxaparin (LOVENOX) injection  30 mg Subcutaneous Q24H  . feeding supplement  1 Container Oral TID BM  . gabapentin  300 mg Oral QHS  . insulin aspart  0-15 Units Subcutaneous TID WC  . levothyroxine  75 mcg Oral QAC breakfast  . lisinopril  20 mg Oral q morning - 10a  . propranolol  40 mg Oral Daily   And  . propranolol  20 mg Oral Q24H  . senna  1 tablet Oral BID  . vitamin C  500 mg Oral Daily   Continuous Infusions: . sodium chloride       LOS: 3 days    Time spent: 29 minutes spent on chart review, discussion with nursing staff, consultants, updating family and interview/physical exam; more than 50% of that time was spent in counseling and/or coordination of  care.   Signature  Susa RaringPrashant Kenson Groh M.D on 01/04/2019 at 12:45 PM   -  To page go to [email or web address]      01/04/2019, 12:45 PM

## 2019-01-04 NOTE — Progress Notes (Signed)
Pt's BP at 13:13 97/46, 17:54 103/55, held Inderal. MD updated on Pt's status. Restrains had been off the entire shift, Pt now calm and cooperative, Fritz Pickerel (son) came to visit. Pt denies pain and resting comfortably in bed. Endorsed accordingly.

## 2019-01-04 NOTE — Plan of Care (Signed)

## 2019-01-05 ENCOUNTER — Encounter (HOSPITAL_COMMUNITY): Payer: Self-pay | Admitting: *Deleted

## 2019-01-05 LAB — GLUCOSE, CAPILLARY
Glucose-Capillary: 140 mg/dL — ABNORMAL HIGH (ref 70–99)
Glucose-Capillary: 139 mg/dL — ABNORMAL HIGH (ref 70–99)
Glucose-Capillary: 153 mg/dL — ABNORMAL HIGH (ref 70–99)

## 2019-01-05 LAB — CBC
HCT: 27.4 % — ABNORMAL LOW (ref 36.0–46.0)
Hemoglobin: 8.9 g/dL — ABNORMAL LOW (ref 12.0–15.0)
MCH: 32.7 pg (ref 26.0–34.0)
MCHC: 32.5 g/dL (ref 30.0–36.0)
MCV: 100.7 fL — ABNORMAL HIGH (ref 80.0–100.0)
Platelets: 210 10*3/uL (ref 150–400)
RBC: 2.72 MIL/uL — ABNORMAL LOW (ref 3.87–5.11)
RDW: 12.8 % (ref 11.5–15.5)
WBC: 14.7 10*3/uL — ABNORMAL HIGH (ref 4.0–10.5)
nRBC: 0 % (ref 0.0–0.2)

## 2019-01-05 LAB — BASIC METABOLIC PANEL
Anion gap: 9 (ref 5–15)
BUN: 53 mg/dL — ABNORMAL HIGH (ref 8–23)
CO2: 24 mmol/L (ref 22–32)
Calcium: 8.3 mg/dL — ABNORMAL LOW (ref 8.9–10.3)
Chloride: 107 mmol/L (ref 98–111)
Creatinine, Ser: 1.58 mg/dL — ABNORMAL HIGH (ref 0.44–1.00)
GFR calc Af Amer: 32 mL/min — ABNORMAL LOW (ref >60)
GFR calc non Af Amer: 28 mL/min — ABNORMAL LOW (ref >60)
Glucose, Bld: 224 mg/dL — ABNORMAL HIGH (ref 70–99)
Potassium: 3.4 mmol/L — ABNORMAL LOW (ref 3.5–5.1)
Sodium: 140 mmol/L (ref 135–145)

## 2019-01-05 LAB — NOVEL CORONAVIRUS, NAA (HOSP ORDER, SEND-OUT TO REF LAB; TAT 18-24 HRS): SARS-CoV-2, NAA: NOT DETECTED

## 2019-01-05 NOTE — Progress Notes (Signed)
Orthopedic progress note  Patient ID: Lori Ellis MRN: [id number] DOB/AGE: July 18, 1920 85 y.o.  Subjective:  Better today.  Much more interactive.  Sore, but pain controlled.  No CP, Abd Pain, or SOB.  At baseline pt lives alone but has a caregiver She uses a wheelchair essentially as a walker to ambulate    Objective:   VITALS:   Vitals:   01/04/19 1754 01/04/19 2034 01/05/19 0328 01/05/19 0739  BP: (!) 103/55 (!) 110/48 (!) 110/55 (!) 109/52  Pulse: 95 96 95 (!) 105  Resp:  (!) 24 20 14   Temp:  97.7 F (36.5 C) 98.1 F (36.7 C) 98.4 F (36.9 C)  TempSrc:  Oral Oral Oral  SpO2: 100% 98% 97% 97%  Weight:      Height:        Estimated body mass index is 21.26 kg/m as calculated from the following:   Height as of this encounter: 5\' 3"  (1.6 m).   Weight as of this encounter: 54.4 kg.   Intake/Output      07/18 0701 - 07/19 0700 07/19 0701 - 07/20 0700   P.O. 474    I.V. (mL/kg) 1289.7 (23.7)    Total Intake(mL/kg) 1763.7 (32.4)    Urine (mL/kg/hr) 70 (0.1)    Stool 0    Total Output 70    Net +1693.7         Urine Occurrence 1 x    Stool Occurrence 3 x      LABS  Results for orders placed or performed during the hospital encounter of 01/01/19 (from the past 24 hour(s))  Glucose, capillary     Status: Abnormal   Collection Time: 01/04/19 11:44 AM  Result Value Ref Range   Glucose-Capillary 133 (H) 70 - 99 mg/dL  Urinalysis, Routine w reflex microscopic     Status: Abnormal   Collection Time: 01/04/19 12:56 PM  Result Value Ref Range   Color, Urine AMBER (A) YELLOW   APPearance CLOUDY (A) CLEAR   Specific Gravity, Urine 1.021 1.005 - 1.030   pH 5.0 5.0 - 8.0   Glucose, UA NEGATIVE NEGATIVE mg/dL   Hgb urine dipstick MODERATE (A) NEGATIVE   Bilirubin Urine NEGATIVE NEGATIVE   Ketones, ur 5 (A) NEGATIVE mg/dL   Protein, ur 100 (A) NEGATIVE mg/dL   Nitrite NEGATIVE NEGATIVE   Leukocytes,Ua LARGE (A) NEGATIVE   RBC / HPF 6-10 0 - 5 RBC/hpf   WBC, UA  >50 (H) 0 - 5 WBC/hpf   Bacteria, UA MANY (A) NONE SEEN   Squamous Epithelial / LPF 0-5 0 - 5  Glucose, capillary     Status: Abnormal   Collection Time: 01/04/19  4:05 PM  Result Value Ref Range   Glucose-Capillary 148 (H) 70 - 99 mg/dL  Glucose, capillary     Status: Abnormal   Collection Time: 01/04/19  5:55 PM  Result Value Ref Range   Glucose-Capillary 135 (H) 70 - 99 mg/dL  Glucose, capillary     Status: Abnormal   Collection Time: 01/04/19  8:58 PM  Result Value Ref Range   Glucose-Capillary 166 (H) 70 - 99 mg/dL  Glucose, capillary     Status: Abnormal   Collection Time: 01/05/19  7:36 AM  Result Value Ref Range   Glucose-Capillary 140 (H) 70 - 99 mg/dL  CBC     Status: Abnormal   Collection Time: 01/05/19  7:52 AM  Result Value Ref Range   WBC 14.7 (H) 4.0 - 10.5 K/uL  RBC 2.72 (L) 3.87 - 5.11 MIL/uL   Hemoglobin 8.9 (L) 12.0 - 15.0 g/dL   HCT 78.227.4 (L) 95.636.0 - 21.346.0 %   MCV 100.7 (H) 80.0 - 100.0 fL   MCH 32.7 26.0 - 34.0 pg   MCHC 32.5 30.0 - 36.0 g/dL   RDW 08.612.8 57.811.5 - 46.915.5 %   Platelets 210 150 - 400 K/uL   nRBC 0.0 0.0 - 0.2 %     PHYSICAL EXAM:  General:  Upright in bed.  Frail-appearing.  NAD.  Calm and conversant.  MSK: RLE: Dressings CDI.  Distal incision covered by thigh-high TED hose CDI Feet warm.  Minimal swelling.  Compartments soft.  Sensation intact distally.  EHL, FHL, dorsiflexion plantarflexion intact.  Assessment/Plan: 3 Days Post-Op   Active Problems:   CKD (chronic kidney disease) stage 3, GFR 30-59 ml/min (HCC)   Hypothyroidism   Type II diabetes mellitus with renal manifestations (HCC)   Benign essential HTN   Closed intertrochanteric fracture, right, initial encounter (HCC)   Atrial flutter by electrocardiogram (HCC)   Anti-infectives (From admission, onward)   Start     Dose/Rate Route Frequency Ordered Stop   01/02/19 1545  clindamycin (CLEOCIN) IVPB 600 mg     600 mg 100 mL/hr over 30 Minutes Intravenous Every 6 hours  01/02/19 1541 01/03/19 0012   01/02/19 1215  clindamycin (CLEOCIN) IVPB 600 mg  Status:  Discontinued     600 mg 100 mL/hr over 30 Minutes Intravenous On call to O.R. 01/02/19 [id number] 01/03/19 0559    .  85 y/o female s/p fall with R 4 part IT femur fracture   Status post IM nail by Dr. Carola FrostHandy 01/02/2019  POD 3  - ABL anemia/Hemodynamics  Hemoglobin down to 8.9<10.5.  Likely with some delutional component with IVFs started yesterday.  No sign of active bleeding.  Asymptomatic.    Still seems a bit dry.  Making some dark [facility] to [facility].  Bedpad wet this a.m.  Imrpoving p.o. intake.Marland Kitchen.  No BMP yet this morning.   - Medical issues               Per primary: AKI, Leukocytosis, UTI? (Cx pending), HTN, hypothyroidism   - ID:              periop abx completed    - Metabolic Bone Disease:             Mechanism and fracture indicative of osteoporosis    Continue with vitamin d and calcium   Optimize nutrition    - FEN/GI prophylaxis/Foley/Lines:             RD consulted    Weightbearing: WBAT RLE.  ROM as tolerated. Insicional and dressing care: Daily dressing changes with 4x4's and tape Orthopedic device(s): walker Showering: ok to shower and clean wounds with soap and water only  VTE prophylaxis: Lovenox 30 days  Pain control: Minimize narcotics.  Tylenol.  Norco for severe pain  Follow - up plan: 3 weeks with Dr. Carola FrostHandy  Dispo: Therapy evaluations ongoing.  Likely SNF.  [facility] in the past.  Albina BilletHenry Calvin Martensen III, PA-C 01/05/2019 9:36 AM

## 2019-01-05 NOTE — Progress Notes (Signed)
PROGRESS NOTE    Lori Ellis  UEA:[id number] DOB: 07/10/1920 DOA: 01/01/2019 PCP: Malka SoJobe, Daniel B., MD    Brief Narrative:   Lori Ellis is a 85 y.o. female with medical history significant of non-insulin-dependent diabetes, degenerative disc disease lumbar spine, hypertension.  Patient has been living in her own home but has 24/7 care.  She is mobile in a wheelchair and does use a walker but has been less mobile over time according to her son.  Patient was at home in her usual state of health when she had a mechanical fall striking her head and her hip.  She had no apparent loss of consciousness.  Because of her significant pain following her fall as well as a laceration to her occiput she presented to [facility] emergency department for evaluation.  She is now admitted for intertrochanteric fracture right hip patient has significant pain at rest and worse pain with movement.  He has been restless and uncomfortable.    ED Course: Evaluation in the emergency department revealed the patient to have a laceration to the right occiput.  This was repaired with interrupted sutures.  She had a CT scan of the head which was unremarkable except for cerebral atrophy.  Patient did have x-rays of the pelvis and hip and was found to have an intertrochanteric fracture of the right hip.  She developed poor bone mineralization was raised in that report.  Patient was referred for admission for fractured hip with operative repair planned after orthopedic consult in the a.m.   Subjective: Patient in bed, agitated, denies any headache chest or abdominal pain.  Unreliable historian.  Assessment & Plan:    Right intertrochanteric hip fracture s/p IMN Patient presenting from home with decreased ability to ambulate with right-sided hip pain.  X-ray on admission with findings of right intertrochanteric hip fracture.  Patient underwent IMN to right intertrochanteric hip fracture on 01/02/2019 by Dr. Carola FrostHandy.  --Weightbearing as tolerated, with a walker --Lovenox 30 mg subcutaneously every 24 hours x 30 days per orthopedics --Followed by PT OT, will require SNF.  Social worker working on placement.  Essential hypertension -  Continue home medications which include Norvasc, lisinopril and propranolol.     Hypothyroidism: Continue levothyroxine 75 mcg p.o. daily  Acute hospital-acquired delirium/encephalopathy- currently on nightly melatonin and PRN Haldol with good effect, initially required some restraints, remains at risk for delirium and encephalopathy.  We will continue to monitor.  Minimize narcotics and benzodiazepines.  AKI.  Was due to dehydration, hydrated with IV fluids will repeat BMP tomorrow.  Have noticed some fall in H&H likely due to hemodilution with IV fluids will continue to monitor.  No signs of bleeding.  Leukocytosis.  Could be reactive, UA is borderline and she is currently afebrile, will follow urine cultures.  Does have multiple drug allergies..  Anemia of chronic disease with some acute drop due to hemodilution from IV fluids.  We will continue to monitor.    DVT prophylaxis: Lovenox Code Status: DNR Family Communication: None Disposition Plan: Inpatient hospitalization, likely will need SNF on discharge   Consultants:   Orthopedic surgery, Dr. Carola FrostHandy  Procedures:   IMN 01/02/2019 by Dr. Carola FrostHandy  Antimicrobials:   Perioperative clindamycin     Objective: Vitals:   01/04/19 1754 01/04/19 2034 01/05/19 0328 01/05/19 0739  BP: (!) 103/55 (!) 110/48 (!) 110/55 (!) 109/52  Pulse: 95 96 95 (!) 105  Resp:  (!) 24 20 14   Temp:  97.7 F (36.5  C) 98.1 F (36.7 C) 98.4 F (36.9 C)  TempSrc:  Oral Oral Oral  SpO2: 100% 98% 97% 97%  Weight:      Height:        Intake/Output Summary (Last 24 hours) at 01/05/2019 1100 Last data filed at 01/05/2019 0700 Gross per 24 hour  Intake 1763.68 ml  Output 70 ml  Net 1693.68 ml   Filed Weights   01/01/19 1436   Weight: 54.4 kg    Examination:  Awake Alert,   No new F.N deficits, Normal affect [place].AT,PERRAL Supple Neck,No JVD, No cervical lymphadenopathy appriciated.  Symmetrical Chest wall movement, Good air movement bilaterally, CTAB RRR,No Gallops, Rubs or new Murmurs, No Parasternal Heave +ve B.Sounds, Abd Soft, No tenderness, No organomegaly appriciated, No rebound - guarding or rigidity. No Cyanosis, Clubbing or edema, No new Rash or bruise  Data Reviewed: I have personally reviewed following labs and imaging studies  CBC: Recent Labs  Lab 01/01/19 1445 01/02/19 0859 01/03/19 0426 01/04/19 0524 01/05/19 0752  WBC 9.0 12.8* 16.7* 18.7* 14.7*  NEUTROABS 5.7  --   --   --   --   HGB 12.3 12.7 10.9* 10.5* 8.9*  HCT 38.9 38.2 33.4* 33.0* 27.4*  MCV 101.3* 97.7 98.5 100.0 100.7*  PLT 191 196 198 189 210   Basic Metabolic Panel: Recent Labs  Lab 01/01/19 1445 01/02/19 0859 01/03/19 0426 01/04/19 0524  NA 141 142 140 141  K 4.0 3.4* 3.5 3.6  CL 103 101 104 106  CO2 29 26 24 22   GLUCOSE 166* 160* 154* 158*  BUN 14 16 23  41*  CREATININE 1.06* 1.08* 1.11* 1.59*  CALCIUM 9.1 9.4 8.9 8.7*  MG  --   --   --  2.2   GFR: Estimated Creatinine Clearance: 17.5 mL/min (A) (by C-G formula based on SCr of 1.59 mg/dL (H)). Liver Function Tests: No results for input(s): AST, ALT, ALKPHOS, BILITOT, PROT, ALBUMIN in the last 168 hours. No results for input(s): LIPASE, AMYLASE in the last 168 hours. No results for input(s): AMMONIA in the last 168 hours. Coagulation Profile: Recent Labs  Lab 01/01/19 1445 01/02/19 0859  INR 1.0 1.0   Cardiac Enzymes: No results for input(s): CKTOTAL, CKMB, CKMBINDEX, TROPONINI in the last 168 hours. BNP (last 3 results) No results for input(s): PROBNP in the last 8760 hours. HbA1C: No results for input(s): HGBA1C in the last 72 hours. CBG: Recent Labs  Lab 01/04/19 1144 01/04/19 1605 01/04/19 1755 01/04/19 2058 01/05/19 0736  GLUCAP 133*  148* 135* 166* 140*   Lipid Profile: No results for input(s): CHOL, HDL, LDLCALC, TRIG, CHOLHDL, LDLDIRECT in the last 72 hours. Thyroid Function Tests: No results for input(s): TSH, T4TOTAL, FREET4, T3FREE, THYROIDAB in the last 72 hours. Anemia Panel: No results for input(s): VITAMINB12, FOLATE, FERRITIN, TIBC, IRON, RETICCTPCT in the last 72 hours. Sepsis Labs: No results for input(s): PROCALCITON, LATICACIDVEN in the last 168 hours.  Recent Results (from the past 240 hour(s))  SARS Coronavirus 2 (CEPHEID - Performed in [facility] hospital lab), Hosp Order     Status: None   Collection Time: 01/01/19  7:01 PM   Specimen: Nasopharyngeal Swab  Result Value Ref Range Status   SARS Coronavirus 2 NEGATIVE NEGATIVE Final    Comment: (NOTE) If result is NEGATIVE SARS-CoV-2 target nucleic acids are NOT DETECTED. The SARS-CoV-2 RNA is generally detectable in upper and lower  respiratory specimens during the acute phase of infection. The lowest  concentration of SARS-CoV-2  viral copies this assay can detect is 250  copies / mL. A negative result does not preclude SARS-CoV-2 infection  and should not be used as the sole basis for treatment or other  patient management decisions.  A negative result may occur with  improper specimen collection / handling, submission of specimen other  than nasopharyngeal swab, presence of viral mutation(s) within the  areas targeted by this assay, and inadequate number of viral copies  (<250 copies / mL). A negative result must be combined with clinical  observations, patient history, and epidemiological information. If result is POSITIVE SARS-CoV-2 target nucleic acids are DETECTED. The SARS-CoV-2 RNA is generally detectable in upper and lower  respiratory specimens dur ing the acute phase of infection.  Positive  results are indicative of active infection with SARS-CoV-2.  Clinical  correlation with patient history and other diagnostic information is   necessary to determine patient infection status.  Positive results do  not rule out bacterial infection or co-infection with other viruses. If result is PRESUMPTIVE POSTIVE SARS-CoV-2 nucleic acids MAY BE PRESENT.   A presumptive positive result was obtained on the submitted specimen  and confirmed on repeat testing.  While 2019 novel coronavirus  (SARS-CoV-2) nucleic acids may be present in the submitted sample  additional confirmatory testing may be necessary for epidemiological  and / or clinical management purposes  to differentiate between  SARS-CoV-2 and other Sarbecovirus currently known to infect humans.  If clinically indicated additional testing with an alternate test  methodology [phone]) is advised. The SARS-CoV-2 RNA is generally  detectable in upper and lower respiratory sp ecimens during the acute  phase of infection. The expected result is Negative. Fact Sheet for Patients:  StrictlyIdeas.no Fact Sheet for Healthcare Providers: BankingDealers.co.za This test is not yet approved or cleared by the [place] FDA and has been authorized for detection and/or diagnosis of SARS-CoV-2 by FDA under an Emergency Use Authorization (EUA).  This EUA will remain in effect (meaning this test can be used) for the duration of the COVID-19 declaration under Section 564(b)(1) of the Act, 21 U.S.C. section 360bbb-3(b)(1), unless the authorization is terminated or revoked sooner. Performed at [facility], [place] [address], [place], [place] [address]   Surgical pcr screen     Status: Abnormal   Collection Time: 01/01/19  9:17 PM   Specimen: Nasal Mucosa; Nasal Swab  Result Value Ref Range Status   MRSA, PCR NEGATIVE NEGATIVE Final   Staphylococcus aureus POSITIVE (A) NEGATIVE Final    Comment: (NOTE) The Xpert SA Assay (FDA approved for NASAL specimens in patients 10 years of age and older), is one component of a comprehensive  surveillance program. It is not intended to diagnose infection nor to guide or monitor treatment. Performed at [facility], [place] [address] [place], [place] [address]      Radiology Studies: No results found.   Scheduled Meds: . acetaminophen  500 mg Oral Q6H  . amLODipine  2.5 mg Oral QHS  . cholecalciferol  2,000 Units Oral BID  . docusate sodium  100 mg Oral BID  . enoxaparin (LOVENOX) injection  30 mg Subcutaneous Q24H  . feeding supplement  1 Container Oral TID BM  . gabapentin  300 mg Oral QHS  . insulin aspart  0-15 Units Subcutaneous TID WC  . levothyroxine  75 mcg Oral QAC breakfast  . lisinopril  20 mg Oral q morning - 10a  . propranolol  40 mg Oral Daily  And  . propranolol  20 mg Oral Q24H  . senna  1 tablet Oral BID  . vitamin C  500 mg Oral Daily   Continuous Infusions: . sodium chloride    . lactated ringers 75 mL/hr at 01/05/19 0214     LOS: 4 days    Time spent: 29 minutes spent on chart review, discussion with nursing staff, consultants, updating family and interview/physical exam; more than 50% of that time was spent in counseling and/or coordination of care.   Signature  Susa RaringPrashant Singh M.D on 01/05/2019 at 11:00 AM   -  To page go to [email or web address]      01/05/2019, 11:00 AM

## 2019-01-06 LAB — BASIC METABOLIC PANEL
Anion gap: 12 (ref 5–15)
BUN: 32 mg/dL — ABNORMAL HIGH (ref 8–23)
CO2: 24 mmol/L (ref 22–32)
Calcium: 8.5 mg/dL — ABNORMAL LOW (ref 8.9–10.3)
Chloride: 106 mmol/L (ref 98–111)
Creatinine, Ser: 1.07 mg/dL — ABNORMAL HIGH (ref 0.44–1.00)
GFR calc Af Amer: 51 mL/min — ABNORMAL LOW (ref >60)
GFR calc non Af Amer: 44 mL/min — ABNORMAL LOW (ref >60)
Glucose, Bld: 175 mg/dL — ABNORMAL HIGH (ref 70–99)
Potassium: 3.3 mmol/L — ABNORMAL LOW (ref 3.5–5.1)
Sodium: 142 mmol/L (ref 135–145)

## 2019-01-06 LAB — CBC
HCT: 29.5 % — ABNORMAL LOW (ref 36.0–46.0)
Hemoglobin: 9.4 g/dL — ABNORMAL LOW (ref 12.0–15.0)
MCH: 31.8 pg (ref 26.0–34.0)
MCHC: 31.9 g/dL (ref 30.0–36.0)
MCV: 99.7 fL (ref 80.0–100.0)
Platelets: 213 10*3/uL (ref 150–400)
RBC: 2.96 MIL/uL — ABNORMAL LOW (ref 3.87–5.11)
RDW: 12.4 % (ref 11.5–15.5)
WBC: 12.7 10*3/uL — ABNORMAL HIGH (ref 4.0–10.5)
nRBC: 0 % (ref 0.0–0.2)

## 2019-01-06 LAB — GLUCOSE, CAPILLARY
Glucose-Capillary: 136 mg/dL — ABNORMAL HIGH (ref 70–99)
Glucose-Capillary: 125 mg/dL — ABNORMAL HIGH (ref 70–99)
Glucose-Capillary: 134 mg/dL — ABNORMAL HIGH (ref 70–99)
Glucose-Capillary: 151 mg/dL — ABNORMAL HIGH (ref 70–99)

## 2019-01-06 LAB — URINE CULTURE: Culture: >=100000 — AB

## 2019-01-06 MED ORDER — HYDROCODONE-ACETAMINOPHEN 5-325 MG PO TABS: 1.0000 | ORAL_TABLET | Freq: Four times a day (QID) | ORAL | Status: DC | PRN

## 2019-01-06 MED ORDER — POTASSIUM CHLORIDE 10 MEQ/100ML IV SOLN
10.0000 meq | Freq: Once | INTRAVENOUS | Status: AC
  Administered 2019-01-06: 10 meq via INTRAVENOUS
  Filled 2019-01-06: qty 100

## 2019-01-06 MED ORDER — HALOPERIDOL LACTATE 5 MG/ML IJ SOLN
5.0000 mg | Freq: Once | INTRAMUSCULAR | Status: AC
  Administered 2019-01-06: 5 mg via INTRAMUSCULAR
  Filled 2019-01-06: qty 1

## 2019-01-06 MED ORDER — CIPROFLOXACIN IN D5W 400 MG/200ML IV SOLN
400.0000 mg | INTRAVENOUS | Status: AC
  Administered 2019-01-06 – 2019-01-08 (×3): 400 mg via INTRAVENOUS
  Filled 2019-01-06 (×3): qty 200

## 2019-01-06 MED ORDER — POTASSIUM CHLORIDE 10 MEQ/100ML IV SOLN
10.0000 meq | INTRAVENOUS | Status: AC
  Administered 2019-01-06: 10 meq via INTRAVENOUS
  Filled 2019-01-06: qty 100

## 2019-01-06 MED ORDER — MUPIROCIN 2 % EX OINT
1.0000 "application " | TOPICAL_OINTMENT | Freq: Two times a day (BID) | CUTANEOUS | Status: DC
  Administered 2019-01-06 – 2019-01-10 (×8): 1 via NASAL
  Filled 2019-01-06: qty 22

## 2019-01-06 MED ORDER — CHLORHEXIDINE GLUCONATE CLOTH 2 % EX PADS
6.0000 | MEDICATED_PAD | Freq: Every day | CUTANEOUS | Status: DC
  Administered 2019-01-07 – 2019-01-10 (×3): 6 via TOPICAL

## 2019-01-06 NOTE — Progress Notes (Signed)
Pt calm and cooperative, denies pain, wrist restraints continued to be off, eating dinner. Will continue to monitor.

## 2019-01-06 NOTE — Progress Notes (Signed)
Removed wrist restraints, Pt currently calm and cooperative, resting comfortably in bed, denies any pain. Son Fritz Pickerel updated of Pt's status. Will continue to monitor.

## 2019-01-06 NOTE — Progress Notes (Signed)
VAST consulted to place PIV.  Called and spoke to patient's nurse who stated they were able to obtain IV access.

## 2019-01-06 NOTE — Progress Notes (Signed)
PROGRESS NOTE    Lori PateDoris B Yi  OZD:[id number] DOB: 11/20/1920 DOA: 01/01/2019 PCP: Malka SoJobe, Daniel B., MD    Brief Narrative:   Lori Ellis is a 85 y.o. female with medical history significant of non-insulin-dependent diabetes, degenerative disc disease lumbar spine, hypertension.  Patient has been living in her own home but has 24/7 care.  She is mobile in a wheelchair and does use a walker but has been less mobile over time according to her son.  Patient was at home in her usual state of health when she had a mechanical fall striking her head and her hip.  She had no apparent loss of consciousness.  Because of her significant pain following her fall as well as a laceration to her occiput she presented to [facility] emergency department for evaluation.  She is now admitted for intertrochanteric fracture right hip patient has significant pain at rest and worse pain with movement.  He has been restless and uncomfortable.    ED Course: Evaluation in the emergency department revealed the patient to have a laceration to the right occiput.  This was repaired with interrupted sutures.  She had a CT scan of the head which was unremarkable except for cerebral atrophy.  Patient did have x-rays of the pelvis and hip and was found to have an intertrochanteric fracture of the right hip.  She developed poor bone mineralization was raised in that report.  Patient was referred for admission for fractured hip with operative repair planned after orthopedic consult in the a.m.   Subjective: Patient in bed, agitated, denies any headache chest or abdominal pain.  Trying to hit staff.  Assessment & Plan:    Right intertrochanteric hip fracture s/p IMN Patient presenting from home with decreased ability to ambulate with right-sided hip pain.  X-ray on admission with findings of right intertrochanteric hip fracture.  Patient underwent IMN to right intertrochanteric hip fracture on 01/02/2019 by Dr. Carola FrostHandy.  --Weightbearing as tolerated, with a walker --Lovenox 30 mg subcutaneously every 24 hours x 30 days per orthopedics --Followed by PT OT, will require SNF.  Social worker working on placement.  Essential hypertension -  Continue home medications which include Norvasc, lisinopril and propranolol.     Hypothyroidism: Continue levothyroxine 75 mcg p.o. daily  Acute hospital-acquired delirium/encephalopathy- currently on nightly melatonin and PRN Haldol with good effect, initially required some restraints, remains at risk for delirium and encephalopathy.  We will continue to monitor.  Minimize narcotics and benzodiazepines.  AKI.  Was due to dehydration, hydrated with IV fluids and resolved .  Leukocytosis/ E. Coli.  Placed on Cipro 01/06/2019 give her 3-day course.  Anemia of chronic disease with some acute drop due to hemodilution from IV fluids.  We will continue to monitor.    DVT prophylaxis: Lovenox Code Status: DNR Family Communication: Son Peyton NajjarLarry over the phone on 01/06/2019. Disposition Plan:SNF   Consultants:   Orthopedic surgery, Dr. Carola FrostHandy  Procedures:   IMN 01/02/2019 by Dr. Carola FrostHandy  Antimicrobials:   Perioperative clindamycin     Objective: Vitals:   01/05/19 0328 01/05/19 0739 01/05/19 1624 01/06/19 1012  BP: (!) 110/55 (!) 109/52 (!) 111/40 118/66  Pulse: 95 (!) 105 77 86  Resp: 20 14 14 17   Temp: 98.1 F (36.7 C) 98.4 F (36.9 C) 97.9 F (36.6 C) 98.2 F (36.8 C)  TempSrc: Oral Oral Oral Oral  SpO2: 97% 97% 98% 100%  Weight:      Height:  Intake/Output Summary (Last 24 hours) at 01/06/2019 1047 Last data filed at 01/06/2019 1020 Gross per 24 hour  Intake 1260 ml  Output 600 ml  Net 660 ml   Filed Weights   01/01/19 1436  Weight: 54.4 kg    Examination:  Awake but confused and agitated, No new F.N deficits, Normal affect [place].AT,PERRAL Supple Neck,No JVD, No cervical lymphadenopathy appriciated.  Symmetrical Chest wall movement, Good air  movement bilaterally, CTAB RRR,No Gallops, Rubs or new Murmurs, No Parasternal Heave +ve B.Sounds, Abd Soft, No tenderness, No organomegaly appriciated, No rebound - guarding or rigidity. No Cyanosis, Clubbing or edema, No new Rash or bruise   Data Reviewed: I have personally reviewed following labs and imaging studies  CBC: Recent Labs  Lab 01/01/19 1445 01/02/19 0859 01/03/19 0426 01/04/19 0524 01/05/19 0752 01/06/19 0719  WBC 9.0 12.8* 16.7* 18.7* 14.7* 12.7*  NEUTROABS 5.7  --   --   --   --   --   HGB 12.3 12.7 10.9* 10.5* 8.9* 9.4*  HCT 38.9 38.2 33.4* 33.0* 27.4* 29.5*  MCV 101.3* 97.7 98.5 100.0 100.7* 99.7  PLT 191 196 198 189 210 213   Basic Metabolic Panel: Recent Labs  Lab 01/02/19 0859 01/03/19 0426 01/04/19 0524 01/05/19 1011 01/06/19 0719  NA 142 140 141 140 142  K 3.4* 3.5 3.6 3.4* 3.3*  CL 101 104 106 107 106  CO2 26 24 22 24 24   GLUCOSE 160* 154* 158* 224* 175*  BUN 16 23 41* 53* 32*  CREATININE 1.08* 1.11* 1.59* 1.58* 1.07*  CALCIUM 9.4 8.9 8.7* 8.3* 8.5*  MG  --   --  2.2  --   --    GFR: Estimated Creatinine Clearance: 26 mL/min (A) (by C-G formula based on SCr of 1.07 mg/dL (H)). Liver Function Tests: No results for input(s): AST, ALT, ALKPHOS, BILITOT, PROT, ALBUMIN in the last 168 hours. No results for input(s): LIPASE, AMYLASE in the last 168 hours. No results for input(s): AMMONIA in the last 168 hours. Coagulation Profile: Recent Labs  Lab 01/01/19 1445 01/02/19 0859  INR 1.0 1.0   Cardiac Enzymes: No results for input(s): CKTOTAL, CKMB, CKMBINDEX, TROPONINI in the last 168 hours. BNP (last 3 results) No results for input(s): PROBNP in the last 8760 hours. HbA1C: No results for input(s): HGBA1C in the last 72 hours. CBG: Recent Labs  Lab 01/04/19 2058 01/05/19 0736 01/05/19 1155 01/05/19 1609 01/06/19 0746  GLUCAP 166* 140* 139* 153* 136*   Lipid Profile: No results for input(s): CHOL, HDL, LDLCALC, TRIG, CHOLHDL,  LDLDIRECT in the last 72 hours. Thyroid Function Tests: No results for input(s): TSH, T4TOTAL, FREET4, T3FREE, THYROIDAB in the last 72 hours. Anemia Panel: No results for input(s): VITAMINB12, FOLATE, FERRITIN, TIBC, IRON, RETICCTPCT in the last 72 hours. Sepsis Labs: No results for input(s): PROCALCITON, LATICACIDVEN in the last 168 hours.  Recent Results (from the past 240 hour(s))  SARS Coronavirus 2 (CEPHEID - Performed in [facility] hospital lab), Hosp Order     Status: None   Collection Time: 01/01/19  7:01 PM   Specimen: Nasopharyngeal Swab  Result Value Ref Range Status   SARS Coronavirus 2 NEGATIVE NEGATIVE Final    Comment: (NOTE) If result is NEGATIVE SARS-CoV-2 target nucleic acids are NOT DETECTED. The SARS-CoV-2 RNA is generally detectable in upper and lower  respiratory specimens during the acute phase of infection. The lowest  concentration of SARS-CoV-2 viral copies this assay can detect is 250  copies /  mL. A negative result does not preclude SARS-CoV-2 infection  and should not be used as the sole basis for treatment or other  patient management decisions.  A negative result may occur with  improper specimen collection / handling, submission of specimen other  than nasopharyngeal swab, presence of viral mutation(s) within the  areas targeted by this assay, and inadequate number of viral copies  (<250 copies / mL). A negative result must be combined with clinical  observations, patient history, and epidemiological information. If result is POSITIVE SARS-CoV-2 target nucleic acids are DETECTED. The SARS-CoV-2 RNA is generally detectable in upper and lower  respiratory specimens dur ing the acute phase of infection.  Positive  results are indicative of active infection with SARS-CoV-2.  Clinical  correlation with patient history and other diagnostic information is  necessary to determine patient infection status.  Positive results do  not rule out bacterial  infection or co-infection with other viruses. If result is PRESUMPTIVE POSTIVE SARS-CoV-2 nucleic acids MAY BE PRESENT.   A presumptive positive result was obtained on the submitted specimen  and confirmed on repeat testing.  While 2019 novel coronavirus  (SARS-CoV-2) nucleic acids may be present in the submitted sample  additional confirmatory testing may be necessary for epidemiological  and / or clinical management purposes  to differentiate between  SARS-CoV-2 and other Sarbecovirus currently known to infect humans.  If clinically indicated additional testing with an alternate test  methodology [phone]) is advised. The SARS-CoV-2 RNA is generally  detectable in upper and lower respiratory sp ecimens during the acute  phase of infection. The expected result is Negative. Fact Sheet for Patients:  StrictlyIdeas.no Fact Sheet for Healthcare Providers: BankingDealers.co.za This test is not yet approved or cleared by the [place] FDA and has been authorized for detection and/or diagnosis of SARS-CoV-2 by FDA under an Emergency Use Authorization (EUA).  This EUA will remain in effect (meaning this test can be used) for the duration of the COVID-19 declaration under Section 564(b)(1) of the Act, 21 U.S.C. section 360bbb-3(b)(1), unless the authorization is terminated or revoked sooner. Performed at [facility], [place] [address] [place], [place] [address]   Surgical pcr screen     Status: Abnormal   Collection Time: 01/01/19  9:17 PM   Specimen: Nasal Mucosa; Nasal Swab  Result Value Ref Range Status   MRSA, PCR NEGATIVE NEGATIVE Final   Staphylococcus aureus POSITIVE (A) NEGATIVE Final    Comment: (NOTE) The Xpert SA Assay (FDA approved for NASAL specimens in patients 80 years of age and older), is one component of a comprehensive surveillance program. It is not intended to diagnose infection nor to guide or monitor  treatment. Performed at [facility], [place] [address] [place], [place] [address]   Novel Coronavirus, NAA (hospital order; send-out to ref lab)     Status: None   Collection Time: 01/04/19  5:00 AM   Specimen: Nasopharyngeal Swab; Respiratory  Result Value Ref Range Status   SARS-CoV-2, NAA NOT DETECTED NOT DETECTED Final    Comment: (NOTE) This test was developed and its performance characteristics determined by [employer]. This test has not been FDA cleared or approved. This test has been authorized by FDA under an Emergency Use Authorization (EUA). This test is only authorized for the duration of time the declaration that circumstances exist justifying the authorization of the emergency use of in vitro diagnostic tests for detection of SARS-CoV-2 virus and/or diagnosis of COVID-19 infection under section  564(b)(1) of the Act, 21 U.S.C. 161WRU-0(A)(5360bbb-3(b)(1), unless the authorization is terminated or revoked sooner. When diagnostic testing is negative, the possibility of a false negative result should be considered in the context of a patient's recent exposures and the presence of clinical signs and symptoms consistent with COVID-19. An individual without symptoms of COVID-19 and who is not shedding SARS-CoV-2 virus would expect to have a negative (not detected) result in this assay. Performed  At: [facility] [address] [place], [place] [id number] Jolene SchimkeNagendra Sanjai MD NW:[id number]:[phone]    Coronavirus Source NASOPHARYNGEAL  Final    Comment: Performed at [facility], [address] [place], [place] [address]  Culture, Urine     Status: Abnormal   Collection Time: 01/04/19 12:47 PM   Specimen: Urine, Clean Catch  Result Value Ref Range Status   Specimen Description URINE, CLEAN CATCH  Final   Special Requests   Final    NONE Performed at [facility], [address] [place], [place] [address]    Culture >=100,000 COLONIES/mL ESCHERICHIA COLI (A)   Final   Report Status 01/06/2019 FINAL  Final   Organism ID, Bacteria ESCHERICHIA COLI (A)  Final      Susceptibility   Escherichia coli - MIC*    AMPICILLIN 4 SENSITIVE Sensitive     CEFAZOLIN <=4 SENSITIVE Sensitive     CEFTRIAXONE <=1 SENSITIVE Sensitive     CIPROFLOXACIN <=0.25 SENSITIVE Sensitive     GENTAMICIN <=1 SENSITIVE Sensitive     IMIPENEM <=0.25 SENSITIVE Sensitive     NITROFURANTOIN <=16 SENSITIVE Sensitive     TRIMETH/SULFA <=20 SENSITIVE Sensitive     AMPICILLIN/SULBACTAM <=2 SENSITIVE Sensitive     PIP/TAZO <=4 SENSITIVE Sensitive     Extended ESBL NEGATIVE Sensitive     * >=100,000 COLONIES/mL ESCHERICHIA COLI     Radiology Studies: No results found.   Scheduled Meds: . acetaminophen  500 mg Oral Q6H  . amLODipine  2.5 mg Oral QHS  . Chlorhexidine Gluconate Cloth  6 each Topical Daily  . cholecalciferol  2,000 Units Oral BID  . docusate sodium  100 mg Oral BID  . enoxaparin (LOVENOX) injection  30 mg Subcutaneous Q24H  . feeding supplement  1 Container Oral TID BM  . gabapentin  300 mg Oral QHS  . insulin aspart  0-15 Units Subcutaneous TID WC  . levothyroxine  75 mcg Oral QAC breakfast  . mupirocin ointment  1 application Nasal BID  . propranolol  40 mg Oral Daily   And  . propranolol  20 mg Oral Q24H  . senna  1 tablet Oral BID  . vitamin C  500 mg Oral Daily   Continuous Infusions: . sodium chloride    . ciprofloxacin       LOS: 5 days    Time spent: 30 minutes spent on chart review, discussion with nursing staff, consultants, updating family and interview/physical exam; more than 50% of that time was spent in counseling and/or coordination of care.   Signature  Susa RaringPrashant Cassity Christian M.D on 01/06/2019 at 10:47 AM   -  To page go to [email or web address]   01/06/2019, 10:47 AM

## 2019-01-06 NOTE — Progress Notes (Signed)
Physical Therapy Treatment Patient Details Name: Lori Ellis MRN: [id number] DOB: 1920-08-13 Today's Date: 01/06/2019    History of Present Illness (P) Pt is a 85 y.o. female admitted 01/01/19 after fall sustaining R hip fx. S/p R hip IM nail 7/16. PMH includes DM, HTN, lumbar DDD.    PT Comments    Pt was seen for mobility and strengthening, resisted effort to have her up sitting on side of bed.  Pt had UE restraints in place, and when removed by PT for tx tried to reach for her IV.  Pt was able to be distracted from this, but struggled to keep her eyes open.  Son was in and discussed her willingness to let PT work with her, as well as the longer range plans for rehab if pt is eligible.  Follow acutely for work on sitting up and trying to get to the chair, which would be better accomplished with two person assist than a lift due to cognition of pt.     Follow Up Recommendations  (P) SNF     Equipment Recommendations  (P) None recommended by PT    Recommendations for Other Services       Precautions / Restrictions Precautions Precautions: (P) Fall Precaution Comments: (P) painful with all movement of RLE Restrictions Weight Bearing Restrictions: (P) Yes RLE Weight Bearing: (P) Weight bearing as tolerated    Mobility  Bed Mobility Overal bed mobility: (P) Needs Assistance Bed Mobility: (P) Supine to Sit;Sit to Supine     Supine to sit: (P) Max assist(partially) Sit to supine: (P) Max assist   General bed mobility comments: (P) Pt is in some discomfort with any movement and will not let PT move her to sitting  Transfers                 General transfer comment: (P) unable to get to sitting  Ambulation/Gait                 Stairs             Wheelchair Mobility    Modified Rankin (Stroke Patients Only)       Balance                                            Cognition Arousal/Alertness: (P) Lethargic Behavior During  Therapy: (P) Agitated Overall Cognitive Status: (P) History of cognitive impairments - at baseline                                 General Comments: (P) pt is keeping her eyes closed and is not liking being asked to open her eyes      Exercises General Exercises - Lower Extremity Ankle Circles/Pumps: (P) PROM;Both;5 reps Short Arc Quad: (P) AROM;Left;10 reps Heel Slides: (P) AAROM;AROM;Both;10 reps Hip ABduction/ADduction: (P) AAROM;Both;10 reps    General Comments General comments (skin integrity, edema, etc.): (P) pt is in bed with UE's restrained and when PT removed to work on sitting up, immediately reached over to IV.  Redirected her and pt did not pay attention to it any more      Pertinent Vitals/Pain Pain Assessment: (P) Faces Faces Pain Scale: (P) Hurts even more Pain Location: (P) RLE Pain Descriptors / Indicators: (P) Operative site guarding Pain Intervention(s): (P) Monitored during  session;Premedicated before session;Repositioned;Limited activity within patient's tolerance    Home Living                      Prior Function            PT Goals (current goals can now be found in the care plan section) Acute Rehab PT Goals Patient Stated Goal: (P) not move RLE    Frequency    (P) Min 3X/week      PT Plan (P) Current plan remains appropriate    Co-evaluation              AM-PAC PT "6 Clicks" Mobility   Outcome Measure  Help needed turning from your back to your side while in a flat bed without using bedrails?: (P) A Lot Help needed moving from lying on your back to sitting on the side of a flat bed without using bedrails?: (P) Total Help needed moving to and from a bed to a chair (including a wheelchair)?: (P) Total Help needed standing up from a chair using your arms (e.g., wheelchair or bedside chair)?: (P) Total Help needed to walk in hospital room?: (P) Total Help needed climbing 3-5 steps with a railing? : (P) Total 6  Click Score: (P) 7    End of Session Equipment Utilized During Treatment: (P) Other (comment)(UE wrist restraints) Activity Tolerance: (P) Patient limited by pain;Patient limited by lethargy Patient left: (P) in bed;with call bell/phone within reach;with bed alarm set Nurse Communication: (P) Mobility status;Other (comment)(pt asked to have IV alarm shut off) PT Visit Diagnosis: (P) Other abnormalities of gait and mobility (R26.89);Muscle weakness (generalized) (M62.81)     Time: (P) 1201-(P) 1227 PT Time Calculation (min) (ACUTE ONLY): (P) 26 min  Charges:  $Therapeutic Exercise: (P) 8-22 mins $Therapeutic Activity: (P) 8-22 mins                    Ivar DrapeRuth E Nataliyah Packham 01/06/2019, 12:46 PM   Samul Dadauth [name], PT MS Acute Rehab Dept. Number: [facility] [phone] and [facility] [phone]

## 2019-01-06 NOTE — Progress Notes (Addendum)
Orthopedic Trauma Service Progress Note  Patient ID: Lori Ellis MRN: [id number] DOB/AGE: 1920-08-15 85 y.o.  Subjective:  Sleeping Confused In restraints   Urine culture show E. Coli    ROS Not cooperative   Objective:   VITALS:   Vitals:   01/04/19 2034 01/05/19 0328 01/05/19 0739 01/05/19 1624  BP:  (!) 110/55 (!) 109/52 (!) 111/40  Pulse:  95 (!) 105 77  Resp:  20 14 14   Temp:  98.1 F (36.7 C) 98.4 F (36.9 C) 97.9 F (36.6 C)  TempSrc: Oral Oral Oral Oral  SpO2:  97% 97% 98%  Weight:      Height:        Estimated body mass index is 21.26 kg/m as calculated from the following:   Height as of this encounter: 5\' 3"  (1.6 m).   Weight as of this encounter: 54.4 kg.   Intake/Output      07/19 0701 - 07/20 0700 07/20 0701 - 07/21 0700   P.O. 360    I.V. (mL/kg) 900 (16.5)    Total Intake(mL/kg) 1260 (23.2)    Urine (mL/kg/hr) 600 (0.5)    Stool 0    Total Output 600    Net +660         Urine Occurrence 3 x    Stool Occurrence 3 x      LABS  Results for orders placed or performed during the hospital encounter of 01/01/19 (from the past 24 hour(s))  Basic metabolic panel     Status: Abnormal   Collection Time: 01/05/19 10:11 AM  Result Value Ref Range   Sodium 140 135 - 145 mmol/L   Potassium 3.4 (L) 3.5 - 5.1 mmol/L   Chloride 107 98 - 111 mmol/L   CO2 24 22 - 32 mmol/L   Glucose, Bld 224 (H) 70 - 99 mg/dL   BUN 53 (H) 8 - 23 mg/dL   Creatinine, Ser 1.58 (H) 0.44 - 1.00 mg/dL   Calcium 8.3 (L) 8.9 - 10.3 mg/dL   GFR calc non Af Amer 28 (L) >60 mL/min   GFR calc Af Amer 32 (L) >60 mL/min   Anion gap 9 5 - 15  Glucose, capillary     Status: Abnormal   Collection Time: 01/05/19 11:55 AM  Result Value Ref Range   Glucose-Capillary 139 (H) 70 - 99 mg/dL  Glucose, capillary     Status: Abnormal   Collection Time: 01/05/19  4:09 PM  Result Value Ref Range   Glucose-Capillary 153 (H) 70 - 99 mg/dL  CBC     Status: Abnormal   Collection Time: 01/06/19  7:19 AM  Result Value Ref Range   WBC 12.7 (H) 4.0 - 10.5 K/uL   RBC 2.96 (L) 3.87 - 5.11 MIL/uL   Hemoglobin 9.4 (L) 12.0 - 15.0 g/dL   HCT 29.5 (L) 36.0 - 46.0 %   MCV 99.7 80.0 - 100.0 fL   MCH 31.8 26.0 - 34.0 pg   MCHC 31.9 30.0 - 36.0 g/dL   RDW 12.4 11.5 - 15.5 %   Platelets 213 150 - 400 K/uL   nRBC 0.0 0.0 - 0.2 %  Basic metabolic panel     Status: Abnormal   Collection Time: 01/06/19  7:19 AM  Result Value Ref Range   Sodium 142  135 - 145 mmol/L   Potassium 3.3 (L) 3.5 - 5.1 mmol/L   Chloride 106 98 - 111 mmol/L   CO2 24 22 - 32 mmol/L   Glucose, Bld 175 (H) 70 - 99 mg/dL   BUN 32 (H) 8 - 23 mg/dL   Creatinine, Ser 1.611.07 (H) 0.44 - 1.00 mg/dL   Calcium 8.5 (L) 8.9 - 10.3 mg/dL   GFR calc non Af Amer 44 (L) >60 mL/min   GFR calc Af Amer 51 (L) >60 mL/min   Anion gap 12 5 - 15  Glucose, capillary     Status: Abnormal   Collection Time: 01/06/19  7:46 AM  Result Value Ref Range   Glucose-Capillary 136 (H) 70 - 99 mg/dL     PHYSICAL EXAM:   Gen: sleeping, in soft restraints  Ext:       Right Lower Extremity   Dressings stable  TED hose in place   Ext is warm   Swelling controlled    Assessment/Plan: 4 Days Post-Op   Active Problems:   CKD (chronic kidney disease) stage 3, GFR 30-59 ml/min (HCC)   Hypothyroidism   Type II diabetes mellitus with renal manifestations (HCC)   Benign essential HTN   Closed intertrochanteric fracture, right, initial encounter (HCC)   Atrial flutter by electrocardiogram (HCC)   Anti-infectives (From admission, onward)   Start     Dose/Rate Route Frequency Ordered Stop   01/02/19 1545  clindamycin (CLEOCIN) IVPB 600 mg     600 mg 100 mL/hr over 30 Minutes Intravenous Every 6 hours 01/02/19 1541 01/03/19 0012   01/02/19 1215  clindamycin (CLEOCIN) IVPB 600 mg  Status:  Discontinued     600 mg 100 mL/hr over 30 Minutes Intravenous  On call to O.R. 01/02/19 [id number] 01/03/19 0559    .  POD/HD#: 54  85 y/o female s/p fall with R 4 part IT femur fracture    -fall   - R 4 part intertrochanteric femur fracture s/p IMN              WBAT R leg             ROM as tolerated             PT/OT evals             Dressing changes as needed                        continue with TED                SNF once pt out of restraints >24 hours    - Pain management:             Minimize opioids             Schedule tylenol                           Appears to be doing well with scheduled tylenol    - ABL anemia/Hemodynamics             stable   - Medical issues              Per primary    UTI   Ordered cipro 400 mg IV due to renal function and allergies       - DVT/PE prophylaxis:             lovenox x  30 days post op    - ID:   cipro for UTI    - Metabolic Bone Disease:             Mechanism and fracture indicative of osteoporosis                          vitamin d levels look ok                          Continue with vitamin d and calcium             Optimize nutrition    - Activity:             WBAT post op    - FEN/GI prophylaxis/Foley/Lines:             appreciate RD eval   Boost Breeze shakes     - Dispo:             ultimately SNF   Ortho issues stable  Follow up with ortho in 2-3 weeks    Weightbearing: WBAT RLE Insicional and dressing care: OK to remove dressings as of now and leave open to air with dry gauze PRN Orthopedic device(s): walker Showering: ok to shower and clean wounds with soap and water only  VTE prophylaxis: lovenox 30 mg sq daily x 4 weeks  . Pain control: tylenol  Follow - up plan: 2-3 weeks  Contact information:  Myrene GalasMichael Handy MD, Montez MoritaKeith Shanyla Marconi PA-C   Mearl LatinKeith W. Ceri [facility], PA-C [phone] (C) 01/06/2019, 9:31 AM  [facility] [address] [place] [address] [phone](O) [phone] (F)

## 2019-01-06 NOTE — Plan of Care (Signed)

## 2019-01-06 NOTE — Care Management Important Message (Signed)
Important Message  Patient Details  Name: Lori Ellis MRN: [id number] Date of Birth: [birth year]   Medicare Important Message Given:  Yes     Memory Argue 01/06/2019, 4:09 PM

## 2019-01-06 NOTE — Progress Notes (Signed)
Pt calm and cooperative, meds taken, restraints off at this time. Purewick in place, Will continue to monitor.

## 2019-01-06 NOTE — Progress Notes (Signed)
Pt was combatative all night. Pt refused vitals, meds, and blood sugars. Pt removed IV. TRH contacted and restraints ordered. Report given to oncoming RN.

## 2019-01-06 NOTE — Progress Notes (Signed)
Restraints continued to be off, Pt calm and cooperative, vital signs taken and recorded, denies any pain 0/10. Will continue to monitor.

## 2019-01-06 NOTE — Progress Notes (Signed)
CSW will continue to follow for discharge planning when patient is out of restraints, as patient can not discharge to SNF until without restraints for 24 hours .  [place], [place]

## 2019-01-07 LAB — GLUCOSE, CAPILLARY
Glucose-Capillary: 155 mg/dL — ABNORMAL HIGH (ref 70–99)
Glucose-Capillary: 229 mg/dL — ABNORMAL HIGH (ref 70–99)
Glucose-Capillary: 104 mg/dL — ABNORMAL HIGH (ref 70–99)
Glucose-Capillary: 149 mg/dL — ABNORMAL HIGH (ref 70–99)

## 2019-01-07 MED ORDER — POTASSIUM CHLORIDE CRYS ER 20 MEQ PO TBCR
20.0000 meq | EXTENDED_RELEASE_TABLET | Freq: Once | ORAL | Status: AC
  Administered 2019-01-07: 20 meq via ORAL
  Filled 2019-01-07: qty 1

## 2019-01-07 MED ORDER — QUETIAPINE FUMARATE 25 MG PO TABS
25.0000 mg | ORAL_TABLET | Freq: Two times a day (BID) | ORAL | Status: DC
  Administered 2019-01-07 – 2019-01-08 (×3): 25 mg via ORAL
  Filled 2019-01-07 (×3): qty 1

## 2019-01-07 NOTE — Progress Notes (Signed)
PROGRESS NOTE    Lori Ellis  JOI:[id number] DOB: 12/20/1920 DOA: 01/01/2019 PCP: Lilian Coma., MD    Brief Narrative:   Lori Ellis is a 85 y.o. female with medical history significant of non-insulin-dependent diabetes, degenerative disc disease lumbar spine, hypertension.  Patient has been living in her own home but has 24/7 care.  She is mobile in a wheelchair and does use a walker but has been less mobile over time according to her son.  Patient was at home in her usual state of health when she had a mechanical fall striking her head and her hip.  She had no apparent loss of consciousness.  Because of her significant pain following her fall as well as a laceration to her occiput she presented to [facility] emergency department for evaluation.  She is now admitted for intertrochanteric fracture right hip patient has significant pain at rest and worse pain with movement.  He has been restless and uncomfortable.    ED Course: Evaluation in the emergency department revealed the patient to have a laceration to the right occiput.  This was repaired with interrupted sutures.  She had a CT scan of the head which was unremarkable except for cerebral atrophy.  Patient did have x-rays of the pelvis and hip and was found to have an intertrochanteric fracture of the right hip.  She developed poor bone mineralization was raised in that report.  Patient was referred for admission for fractured hip with operative repair planned after orthopedic consult in the a.m.   Subjective:  Patient in bed having breakfast, calm today, states she feels good denies any headache chest or abdominal pain.  Assessment & Plan:    Right intertrochanteric hip fracture s/p IMN Patient presenting from home with decreased ability to ambulate with right-sided hip pain.  X-ray on admission with findings of right intertrochanteric hip fracture.  Patient underwent IMN to right intertrochanteric hip fracture on 01/02/2019 by  Dr. Marcelino Scot. --Weightbearing as tolerated, with a walker --Lovenox 30 mg subcutaneously every 24 hours x 30 days per orthopedics --Followed by PT OT, will require SNF.  Social worker working on placement.  Essential hypertension -  Continue home medications which include Norvasc, lisinopril and propranolol.     Hypothyroidism: Continue levothyroxine 75 mcg p.o. daily  Acute hospital-acquired delirium/encephalopathy- currently on nightly melatonin and PRN Haldol with good effect, minimize narcotics and benzodiazepines, was requiring restraints but much improved now, low-dose Seroquel also added to keep her off of restraints which is needed to be off at least 24 hours prior to SNF accepting the patient.  Restraints were removed early morning 01/07/2019.  Note Seroquel has been ordered with instructions to hold if she is somnolent.  AKI.  Was due to dehydration, hydrated with IV fluids and resolved .  Leukocytosis/ E. Coli.  Placed on Cipro 01/06/2019 give her 3-day course.  Anemia of chronic disease with some acute drop due to hemodilution from IV fluids.  We will continue to monitor.    DVT prophylaxis: Lovenox Code Status: DNR Family Communication: Son Fritz Pickerel over the phone on 01/06/2019. Disposition Plan:SNF   Consultants:   Orthopedic surgery, Dr. Marcelino Scot  Procedures:   IMN 01/02/2019 by Dr. Marcelino Scot  Antimicrobials:   Perioperative clindamycin     Objective: Vitals:   01/06/19 1931 01/07/19 0434 01/07/19 0756 01/07/19 0855  BP: (!) 132/54 139/63 138/61 138/61  Pulse: 74 72 77 77  Resp: 14 14 14    Temp: 98.2 F (36.8 C) 97.8  F (36.6 C) 98.2 F (36.8 C)   TempSrc: Oral Oral Oral   SpO2: 99% 98% 100%   Weight:      Height:        Intake/Output Summary (Last 24 hours) at 01/07/2019 1058 Last data filed at 01/07/2019 0900 Gross per 24 hour  Intake 540 ml  Output 875 ml  Net -335 ml   Filed Weights   01/01/19 1436  Weight: 54.4 kg    Examination:  Awake much calm  and alert today, having breakfast, No new F.N deficits,   [place].AT,PERRAL Supple Neck,No JVD, No cervical lymphadenopathy appriciated.  Symmetrical Chest wall movement, Good air movement bilaterally, CTAB RRR,No Gallops, Rubs or new Murmurs, No Parasternal Heave +ve B.Sounds, Abd Soft, No tenderness, No organomegaly appriciated, No rebound - guarding or rigidity. No Cyanosis, Clubbing or edema,     Data Reviewed: I have personally reviewed following labs and imaging studies  CBC: Recent Labs  Lab 01/01/19 1445 01/02/19 0859 01/03/19 0426 01/04/19 0524 01/05/19 0752 01/06/19 0719  WBC 9.0 12.8* 16.7* 18.7* 14.7* 12.7*  NEUTROABS 5.7  --   --   --   --   --   HGB 12.3 12.7 10.9* 10.5* 8.9* 9.4*  HCT 38.9 38.2 33.4* 33.0* 27.4* 29.5*  MCV 101.3* 97.7 98.5 100.0 100.7* 99.7  PLT 191 196 198 189 210 213   Basic Metabolic Panel: Recent Labs  Lab 01/02/19 0859 01/03/19 0426 01/04/19 0524 01/05/19 1011 01/06/19 0719  NA 142 140 141 140 142  K 3.4* 3.5 3.6 3.4* 3.3*  CL 101 104 106 107 106  CO2 26 24 22 24 24   GLUCOSE 160* 154* 158* 224* 175*  BUN 16 23 41* 53* 32*  CREATININE 1.08* 1.11* 1.59* 1.58* 1.07*  CALCIUM 9.4 8.9 8.7* 8.3* 8.5*  MG  --   --  2.2  --   --    GFR: Estimated Creatinine Clearance: 26 mL/min (A) (by C-G formula based on SCr of 1.07 mg/dL (H)). Liver Function Tests: No results for input(s): AST, ALT, ALKPHOS, BILITOT, PROT, ALBUMIN in the last 168 hours. No results for input(s): LIPASE, AMYLASE in the last 168 hours. No results for input(s): AMMONIA in the last 168 hours. Coagulation Profile: Recent Labs  Lab 01/01/19 1445 01/02/19 0859  INR 1.0 1.0   Cardiac Enzymes: No results for input(s): CKTOTAL, CKMB, CKMBINDEX, TROPONINI in the last 168 hours. BNP (last 3 results) No results for input(s): PROBNP in the last 8760 hours. HbA1C: No results for input(s): HGBA1C in the last 72 hours. CBG: Recent Labs  Lab 01/06/19 0746 01/06/19 1129  01/06/19 1637 01/06/19 2016 01/07/19 0722  GLUCAP 136* 125* 134* 151* 155*   Lipid Profile: No results for input(s): CHOL, HDL, LDLCALC, TRIG, CHOLHDL, LDLDIRECT in the last 72 hours. Thyroid Function Tests: No results for input(s): TSH, T4TOTAL, FREET4, T3FREE, THYROIDAB in the last 72 hours. Anemia Panel: No results for input(s): VITAMINB12, FOLATE, FERRITIN, TIBC, IRON, RETICCTPCT in the last 72 hours. Sepsis Labs: No results for input(s): PROCALCITON, LATICACIDVEN in the last 168 hours.  Recent Results (from the past 240 hour(s))  SARS Coronavirus 2 (CEPHEID - Performed in [facility] hospital lab), Hosp Order     Status: None   Collection Time: 01/01/19  7:01 PM   Specimen: Nasopharyngeal Swab  Result Value Ref Range Status   SARS Coronavirus 2 NEGATIVE NEGATIVE Final    Comment: (NOTE) If result is NEGATIVE SARS-CoV-2 target nucleic acids are NOT DETECTED. The  SARS-CoV-2 RNA is generally detectable in upper and lower  respiratory specimens during the acute phase of infection. The lowest  concentration of SARS-CoV-2 viral copies this assay can detect is 250  copies / mL. A negative result does not preclude SARS-CoV-2 infection  and should not be used as the sole basis for treatment or other  patient management decisions.  A negative result may occur with  improper specimen collection / handling, submission of specimen other  than nasopharyngeal swab, presence of viral mutation(s) within the  areas targeted by this assay, and inadequate number of viral copies  (<250 copies / mL). A negative result must be combined with clinical  observations, patient history, and epidemiological information. If result is POSITIVE SARS-CoV-2 target nucleic acids are DETECTED. The SARS-CoV-2 RNA is generally detectable in upper and lower  respiratory specimens dur ing the acute phase of infection.  Positive  results are indicative of active infection with SARS-CoV-2.  Clinical  correlation  with patient history and other diagnostic information is  necessary to determine patient infection status.  Positive results do  not rule out bacterial infection or co-infection with other viruses. If result is PRESUMPTIVE POSTIVE SARS-CoV-2 nucleic acids MAY BE PRESENT.   A presumptive positive result was obtained on the submitted specimen  and confirmed on repeat testing.  While 2019 novel coronavirus  (SARS-CoV-2) nucleic acids may be present in the submitted sample  additional confirmatory testing may be necessary for epidemiological  and / or clinical management purposes  to differentiate between  SARS-CoV-2 and other Sarbecovirus currently known to infect humans.  If clinically indicated additional testing with an alternate test  methodology [phone]([id number]) is advised. The SARS-CoV-2 RNA is generally  detectable in upper and lower respiratory sp ecimens during the acute  phase of infection. The expected result is Negative. Fact Sheet for Patients:  BoilerBrush.com.cyhttps://www.fda.gov/media/136312/download Fact Sheet for Healthcare Providers: https://pope.com/https://www.fda.gov/media/136313/download This test is not yet approved or cleared by the [place] FDA and has been authorized for detection and/or diagnosis of SARS-CoV-2 by FDA under an Emergency Use Authorization (EUA).  This EUA will remain in effect (meaning this test can be used) for the duration of the COVID-19 declaration under Section 564(b)(1) of the Act, 21 U.S.C. section 360bbb-3(b)(1), unless the authorization is terminated or revoked sooner. Performed at [facility], [address] [place], [place] [address]   Surgical pcr screen     Status: Abnormal   Collection Time: 01/01/19  9:17 PM   Specimen: Nasal Mucosa; Nasal Swab  Result Value Ref Range Status   MRSA, PCR NEGATIVE NEGATIVE Final   Staphylococcus aureus POSITIVE (A) NEGATIVE Final    Comment: (NOTE) The Xpert SA Assay (FDA approved for NASAL specimens in patients 22 years  of age and older), is one component of a comprehensive surveillance program. It is not intended to diagnose infection nor to guide or monitor treatment. Performed at [facility], [address] [place], [place] [address]   Novel Coronavirus, NAA (hospital order; send-out to ref lab)     Status: None   Collection Time: 01/04/19  5:00 AM   Specimen: Nasopharyngeal Swab; Respiratory  Result Value Ref Range Status   SARS-CoV-2, NAA NOT DETECTED NOT DETECTED Final    Comment: (NOTE) This test was developed and its performance characteristics determined by [facility]. This test has not been FDA cleared or approved. This test has been authorized by FDA under an Emergency Use Authorization (EUA). This test is only  authorized for the duration of time the declaration that circumstances exist justifying the authorization of the emergency use of in vitro diagnostic tests for detection of SARS-CoV-2 virus and/or diagnosis of COVID-19 infection under section 564(b)(1) of the Act, 21 U.S.C. 161WRU-0(A)(5360bbb-3(b)(1), unless the authorization is terminated or revoked sooner. When diagnostic testing is negative, the possibility of a false negative result should be considered in the context of a patient's recent exposures and the presence of clinical signs and symptoms consistent with COVID-19. An individual without symptoms of COVID-19 and who is not shedding SARS-CoV-2 virus would expect to have a negative (not detected) result in this assay. Performed  At: [facility] [address] [place], [place] [id number] Jolene SchimkeNagendra Sanjai MD NW:[id number]:[phone]    Coronavirus Source NASOPHARYNGEAL  Final    Comment: Performed at [facility], [address] [place], [place] [address]  Culture, Urine     Status: Abnormal   Collection Time: 01/04/19 12:47 PM   Specimen: Urine, Clean Catch  Result Value Ref Range Status   Specimen Description URINE, CLEAN CATCH  Final   Special Requests    Final    NONE Performed at [facility], [address] [place], [place] [address]    Culture >=100,000 COLONIES/mL ESCHERICHIA COLI (A)  Final   Report Status 01/06/2019 FINAL  Final   Organism ID, Bacteria ESCHERICHIA COLI (A)  Final      Susceptibility   Escherichia coli - MIC*    AMPICILLIN 4 SENSITIVE Sensitive     CEFAZOLIN <=4 SENSITIVE Sensitive     CEFTRIAXONE <=1 SENSITIVE Sensitive     CIPROFLOXACIN <=0.25 SENSITIVE Sensitive     GENTAMICIN <=1 SENSITIVE Sensitive     IMIPENEM <=0.25 SENSITIVE Sensitive     NITROFURANTOIN <=16 SENSITIVE Sensitive     TRIMETH/SULFA <=20 SENSITIVE Sensitive     AMPICILLIN/SULBACTAM <=2 SENSITIVE Sensitive     PIP/TAZO <=4 SENSITIVE Sensitive     Extended ESBL NEGATIVE Sensitive     * >=100,000 COLONIES/mL ESCHERICHIA COLI     Radiology Studies: No results found.   Scheduled Meds: . acetaminophen  500 mg Oral Q6H  . amLODipine  2.5 mg Oral QHS  . Chlorhexidine Gluconate Cloth  6 each Topical Daily  . cholecalciferol  2,000 Units Oral BID  . docusate sodium  100 mg Oral BID  . enoxaparin (LOVENOX) injection  30 mg Subcutaneous Q24H  . feeding supplement  1 Container Oral TID BM  . gabapentin  300 mg Oral QHS  . insulin aspart  0-15 Units Subcutaneous TID WC  . levothyroxine  75 mcg Oral QAC breakfast  . mupirocin ointment  1 application Nasal BID  . propranolol  40 mg Oral Daily   And  . propranolol  20 mg Oral Q24H  . QUEtiapine  25 mg Oral BID  . senna  1 tablet Oral BID  . vitamin C  500 mg Oral Daily   Continuous Infusions: . sodium chloride    . ciprofloxacin 400 mg (01/07/19 0942)     LOS: 6 days    Time spent: 30 minutes spent on chart review, discussion with nursing staff, consultants, updating family and interview/physical exam; more than 50% of that time was spent in counseling and/or coordination of care.   Signature  Susa RaringPrashant Singh M.D on 01/07/2019 at 10:58 AM   -  To page go to [email or web address]    01/07/2019, 10:58 AM

## 2019-01-07 NOTE — Progress Notes (Signed)
PT Cancellation Note  Patient Details Name: Lori Ellis MRN: [id number] DOB: 1920-12-13   Cancelled Treatment:    Reason Eval/Treat Not Completed: (P) Fatigue/lethargy limiting ability to participate(Pt sleeping soundly and would not wake for therapy, she respond to her name then quickly returns to sleep.  Unable to progress today based on sleeping soundly.)   [name] 01/07/2019, 4:08 PM  [place], PTA Acute Rehabilitation Services Pager [phone] Office [phone]

## 2019-01-07 NOTE — Progress Notes (Addendum)
Update : Fritz Pickerel chose [employer], CSW initiating insurance authorization with [employer]    CSW provided patient's son Fritz Pickerel with 3 SNF facility bed offers, as patient has been without restraints for 24 hours .  Fritz Pickerel reports he is currently driving and will review this afternoon and provide CSW with choice. CSW reiterated to Fritz Pickerel that choice is needed today in order to initiate insurance authorization today. Fritz Pickerel expressed understanding.   [place], [place]

## 2019-01-08 DIAGNOSIS — R5383 Other fatigue: Secondary | ICD-10-CM | POA: Clinically undetermined

## 2019-01-08 DIAGNOSIS — Z419 Encounter for procedure for purposes other than remedying health state, unspecified: Secondary | ICD-10-CM

## 2019-01-08 LAB — CBC
HCT: 28.4 % — ABNORMAL LOW (ref 36.0–46.0)
Hemoglobin: 9.1 g/dL — ABNORMAL LOW (ref 12.0–15.0)
MCH: 32.2 pg (ref 26.0–34.0)
MCHC: 32 g/dL (ref 30.0–36.0)
MCV: 100.4 fL — ABNORMAL HIGH (ref 80.0–100.0)
Platelets: 243 10*3/uL (ref 150–400)
RBC: 2.83 MIL/uL — ABNORMAL LOW (ref 3.87–5.11)
RDW: 12.5 % (ref 11.5–15.5)
WBC: 10.2 10*3/uL (ref 4.0–10.5)
nRBC: 0 % (ref 0.0–0.2)

## 2019-01-08 LAB — BASIC METABOLIC PANEL
Anion gap: 9 (ref 5–15)
BUN: 32 mg/dL — ABNORMAL HIGH (ref 8–23)
CO2: 25 mmol/L (ref 22–32)
Calcium: 8.3 mg/dL — ABNORMAL LOW (ref 8.9–10.3)
Chloride: 107 mmol/L (ref 98–111)
Creatinine, Ser: 1.29 mg/dL — ABNORMAL HIGH (ref 0.44–1.00)
GFR calc Af Amer: 41 mL/min — ABNORMAL LOW (ref >60)
GFR calc non Af Amer: 35 mL/min — ABNORMAL LOW (ref >60)
Glucose, Bld: 119 mg/dL — ABNORMAL HIGH (ref 70–99)
Potassium: 3.9 mmol/L (ref 3.5–5.1)
Sodium: 141 mmol/L (ref 135–145)

## 2019-01-08 LAB — GLUCOSE, CAPILLARY
Glucose-Capillary: 118 mg/dL — ABNORMAL HIGH (ref 70–99)
Glucose-Capillary: 203 mg/dL — ABNORMAL HIGH (ref 70–99)
Glucose-Capillary: 93 mg/dL (ref 70–99)
Glucose-Capillary: 144 mg/dL — ABNORMAL HIGH (ref 70–99)

## 2019-01-08 LAB — SARS CORONAVIRUS 2 BY RT PCR (HOSPITAL ORDER, PERFORMED IN ~~LOC~~ HOSPITAL LAB): SARS Coronavirus 2: NEGATIVE

## 2019-01-08 MED ORDER — QUETIAPINE FUMARATE 25 MG PO TABS: 12.5000 mg | ORAL_TABLET | Freq: Two times a day (BID) | ORAL | Status: DC

## 2019-01-08 MED ORDER — QUETIAPINE FUMARATE 25 MG PO TABS: 12.5000 mg | ORAL_TABLET | Freq: Every evening | ORAL | Status: DC

## 2019-01-08 MED ORDER — QUETIAPINE FUMARATE 25 MG PO TABS
25.0000 mg | ORAL_TABLET | Freq: Every evening | ORAL | Status: DC
  Administered 2019-01-08 – 2019-01-09 (×2): 25 mg via ORAL
  Filled 2019-01-08 (×2): qty 1

## 2019-01-08 NOTE — Progress Notes (Signed)
PROGRESS NOTE  Lori Ellis:[id number] DOB: 05/04/1921 DOA: 01/01/2019 PCP: Lilian Coma., MD  Brief History    Lori Ellis is a 85 y.o. year old female with medical history significant for non-insulin-dependent diabetes, degenerative disc disease lumbar spine, hypertension who presented on 01/01/2019 with mechanical fall (wheelchair-bound at baseline, does use walker according to her son) and was found to have right intertrochanteric femur fracture status post operative repair.  A & P   1. Right intertrochanteric hip fracture status post intramedullary nailing on 7/16.  Weightbearing as tolerated with walker, has been working with PT who recommends skilled nursing facility seems to have improved in the ability to rehab with PT on 7/22 but still limited by him pathology.  Will decrease Seroquel use for agitation related to presumed delirium? To 25mg  nightly to hopefully decreased daytime lethargy and improve ability to work with PT  Dementia and reassess on next PT evaluation.  2. Hypertension, at goal.  Continue home Norvasc, lisinopril, propranolol.  3. Hypothyroidism, stable.  Continue home levothyroxine  4. Acute hospital-acquired delirium/encephalopathy, improving.  Continue nightly melatonin.  Has not required any Haldol will discontinue PRN use.  Minimize narcotics/benzodiazepines.  Has been off restraints for greater than 24 hours.  Will decrease Seroquel to 12.5 mg twice daily dosing given increased lethargy noted despite hold parameters.  5. AKI, prerenal.  Resolved with IV fluids.  Creatinine slightly elevated at 1.39.  Baseline is 0.8-1, close monitor, avoid nephrotoxins.  Repeat BMP in 24 hours.  6. Leukocytosis last E. coli.  Completed 3-day course of ciprofloxacin.  7. Anemia of chronic disease.  Hemoglobin stable.  Had some element of hemodilution from IV fluids.   DVT prophylaxis: Lovenox Code Status: DNR Family Communication: Spoke with son Lori Ellis Disposition  Plan: PT recommends skilled nursing facility,  patient still lethargic while working with PT though seems to be participating more today, will decrease Seroquel to see if this improves mental status and ability to be rehabbed and reassess.     [facility] Direct contact: see [email or web address] (further directions at bottom of note if needed) 7PM-7AM contact night coverage as at bottom of note 01/08/2019, 4:38 PM  LOS: 7 days   Consultants  . Orthopedics  Procedures  . Intramedullary nailing of right intertrochanteric hip fracture on 7/16 by Dr. Marcelino Scot  Antibiotics  . None  Interval History/Subjective  Asking for water.  Does still report some right hip pain.  Objective   Vitals:  Vitals:   01/08/19 0831 01/08/19 1621  BP: 100/60 (!) 141/60  Pulse: 99 87  Resp: 14 14  Temp: 99 F (37.2 C) 98.7 F (37.1 C)  SpO2: 98% (!) 87%    Exam:  Awake, eyes closed but easily arousable, flat affect, denies pain Symmetrical Chest wall movement, Good air movement bilaterally, CTAB RRR,No Gallops,Rubs or new Murmurs, No Parasternal Heave +ve B.Sounds, Abd Soft, No tenderness, No organomegaly appriciated, No rebound - guarding or rigidity. No Cyanosis, Clubbing or edema, No new Rash or bruise Right hip pain present Oriented to self, place, time, context   I have personally reviewed the following:   Today's Data   Lab Data   Recent Labs  Lab 01/03/19 0426 01/04/19 0524 01/05/19 1011 01/06/19 0719 01/08/19 0540  NA 140 141 140 142 141  K 3.5 3.6 3.4* 3.3* 3.9  CL 104 106 107 106 107  CO2 24 22 24 24 25   GLUCOSE 154* 158* 224* 175* 119*  BUN 23 41* 53* 32*  32*  CREATININE 1.11* 1.59* 1.58* 1.07* 1.29*  CALCIUM 8.9 8.7* 8.3* 8.5* 8.3*  MG  --  2.2  --   --   --   .  Marland Kitchen. Liver Function Tests: . No results for input(s): AST, ALT, ALKPHOS, BILITOT, PROT, ALBUMIN in the last 168 hours. . No results for input(s): LIPASE, AMYLASE in the last 168 hours. . No results for input(s):  AMMONIA in the last 168 hours. . CBC: Recent Labs  Lab 01/03/19 0426 01/04/19 0524 01/05/19 0752 01/06/19 0719 01/08/19 0540  WBC 16.7* 18.7* 14.7* 12.7* 10.2  HGB 10.9* 10.5* 8.9* 9.4* 9.1*  HCT 33.4* 33.0* 27.4* 29.5* 28.4*  MCV 98.5 100.0 100.7* 99.7 100.4*  PLT 198 189 210 213 243  .  Marland Kitchen. CBG: Recent Labs  Lab 01/07/19 1138 01/07/19 1638 01/07/19 2150 01/08/19 0729 01/08/19 1144  GLUCAP 229* 104* 149* 118* 203*  .   Micro Data  . Urine culture, 7/18, E. coli  Imaging  . CT head, 7/29.  No acute intracranial maladies, atrophy, chronic microvascular disease . CT cervical spine degenerative disc and facet disease in the cervical spine, no acute bony abnormalities . Chest x-ray, 7/15 cardiomegaly, no active disease . X-ray hip/pelvis, 7/15 right proximal femur intertrochanteric fracture . Right x-ray, 7/16 moderate degenerative joint disease, no acute abnormalities . Hip x-ray, 7/16 right hip fracture repair . Pelvis x-ray 7/16 expected postsurgical changes following intramedullary nail placement right femur . Femur x-ray, surgical 16.  Expected postoperative appearance following open reduction, internal fixation of the right femur fracture. .   Cardiology Data  . No results for input(s): CKTOTAL, CKMB, CKMBINDEX, TROPONINI in the last 168 hours.  Other Data  .   Scheduled Meds: . acetaminophen  500 mg Oral Q6H  . amLODipine  2.5 mg Oral QHS  . Chlorhexidine Gluconate Cloth  6 each Topical Daily  . cholecalciferol  2,000 Units Oral BID  . docusate sodium  100 mg Oral BID  . enoxaparin (LOVENOX) injection  30 mg Subcutaneous Q24H  . feeding supplement  1 Container Oral TID BM  . gabapentin  300 mg Oral QHS  . insulin aspart  0-15 Units Subcutaneous TID WC  . levothyroxine  75 mcg Oral QAC breakfast  . mupirocin ointment  1 application Nasal BID  . propranolol  40 mg Oral Daily   And  . propranolol  20 mg Oral Q24H  . QUEtiapine  25 mg Oral BID  . senna  1  tablet Oral BID  . vitamin C  500 mg Oral Daily   Continuous Infusions: . sodium chloride      Active Problems:   CKD (chronic kidney disease) stage 3, GFR 30-59 ml/min (HCC)   Hypothyroidism   Type II diabetes mellitus with renal manifestations (HCC)   Benign essential HTN   Closed intertrochanteric fracture, right, initial encounter (HCC)   Atrial flutter by electrocardiogram (HCC)   LOS: 7 days   Laverna PeaceShayla D Nettey  [facility] How to contact the [facility] Attending or Consulting provider 7A - 7P or covering provider during after hours 7P -7A, for this patient?  1. Check the care team in [facility] and look for a) attending/consulting TR[facility] provider listed and b) the [facility] team listed 2. Log into [email or web address] and use [place]'s universal password to access. If you do not have the password, please contact the hospital operator. 3. Locate the [facility] provider you are looking for under [facility] and page to a number that  you can be directly reached. 4. If you still have difficulty reaching the provider, please page the [facility] (Director on Call) for the Hospitalists listed on amion for assistance.

## 2019-01-08 NOTE — Progress Notes (Signed)
[employer] has denied insurance authorization for SNF for patient due to patient being custodial and not being rehabable per their MD's decision.   Patient's son Nile Dear with moving forward with peer to peer, Dr. Lonny Prude informed of Dr. Rebekah Chesterfield contact information to do peer to peer review.   [place], [place]

## 2019-01-08 NOTE — Progress Notes (Signed)
Physical Therapy Treatment Patient Details Name: Lori PateDoris B Ellis MRN: [id number] DOB: 06/18/1921 Today's Date: 01/08/2019    History of Present Illness Pt is a 85 y.o. female admitted 01/01/19 after fall sustaining R hip fx. S/p R hip IM nail 7/16. PMH includes DM, HTN, lumbar DDD.    PT Comments    Pt performed transfer training and limited LE therapeutic exercises.  Once seated she was more alert but remains limited due to lethargy.  Pt continues to benefit from SNF placement to improve strength and function before returning home.     Follow Up Recommendations  SNF;Supervision/Assistance - 24 hour     Equipment Recommendations  (TBD next venue)    Recommendations for Other Services       Precautions / Restrictions Precautions Precautions: Fall Restrictions Weight Bearing Restrictions: Yes RLE Weight Bearing: Weight bearing as tolerated    Mobility  Bed Mobility Overal bed mobility: Needs Assistance Bed Mobility: Supine to Sit;Sit to Supine     Supine to sit: Max assist;+2 for physical assistance     General bed mobility comments: Pt did not resist LE movement but did not assist either.  She did participate in trunk elevation.  Once seated used bed pad to scoot patient's hip forward to edge of bed to prepare for transfer.  Transfers Overall transfer level: Needs assistance Equipment used: 2 person hand held assist Transfers: Sit to/from UGI CorporationStand;Stand Pivot Transfers Sit to Stand: Mod assist;+2 physical assistance Stand pivot transfers: Mod assist;+2 physical assistance       General transfer comment: No clear steps, more shuffling pattern to move from bed to recliner.  Posterior lean noted.  Ambulation/Gait                 Stairs             Wheelchair Mobility    Modified Rankin (Stroke Patients Only)       Balance Overall balance assessment: Needs assistance   Sitting balance-Leahy Scale: Poor       Standing balance-Leahy Scale:  Poor Standing balance comment: MaxA+2 and UE support to maintain standing balance                            Cognition Arousal/Alertness: Lethargic Behavior During Therapy: WFL for tasks assessed/performed;Agitated Overall Cognitive Status: History of cognitive impairments - at baseline                                 General Comments: Baseline dementia but able to redirect agitation.      Exercises General Exercises - Lower Extremity Long Arc Quad: AAROM;Right;10 reps;Seated    General Comments        Pertinent Vitals/Pain      Home Living                      Prior Function            PT Goals (current goals can now be found in the care plan section) Acute Rehab PT Goals Patient Stated Goal: Go to the beach PT Goal Formulation: With patient Potential to Achieve Goals: Fair    Frequency    Min 3X/week      PT Plan Current plan remains appropriate    Co-evaluation              AM-PAC PT "6 Clicks" Mobility  Outcome Measure  Help needed turning from your back to your side while in a flat bed without using bedrails?: Total Help needed moving from lying on your back to sitting on the side of a flat bed without using bedrails?: Total Help needed moving to and from a bed to a chair (including a wheelchair)?: Total Help needed standing up from a chair using your arms (e.g., wheelchair or bedside chair)?: Total Help needed to walk in hospital room?: Total Help needed climbing 3-5 steps with a railing? : Total 6 Click Score: 6    End of Session Equipment Utilized During Treatment: Gait belt Activity Tolerance: Patient tolerated treatment well Patient left: in bed;with call bell/phone within reach;with bed alarm set Nurse Communication: Mobility status PT Visit Diagnosis: Other abnormalities of gait and mobility (R26.89);Muscle weakness (generalized) (M62.81)     Time: 7782-4235 PT Time Calculation (min) (ACUTE  ONLY): 28 min  Charges:  $Therapeutic Activity: 23-37 mins                     Governor Rooks, PTA Acute Rehabilitation Services Pager [phone] Office [phone]     Lori Ellis Lori Ellis 01/08/2019, 2:48 PM

## 2019-01-08 NOTE — Progress Notes (Addendum)
Update 10:07: [facility] has extended bed offer and [facility] agreeable. [place] requesting updated COVID test, MD informed.    [name] preference of [facility] informed CSW they are rescinding their bed offer and declining patient at this time due to documented past negative behaviors.   CSW continues to search for SNF bed to accept patient.   [facility], [place]

## 2019-01-09 LAB — BASIC METABOLIC PANEL
Anion gap: 12 (ref 5–15)
BUN: 28 mg/dL — ABNORMAL HIGH (ref 8–23)
CO2: 23 mmol/L (ref 22–32)
Calcium: 8.6 mg/dL — ABNORMAL LOW (ref 8.9–10.3)
Chloride: 106 mmol/L (ref 98–111)
Creatinine, Ser: 1.3 mg/dL — ABNORMAL HIGH (ref 0.44–1.00)
GFR calc Af Amer: 40 mL/min — ABNORMAL LOW (ref >60)
GFR calc non Af Amer: 35 mL/min — ABNORMAL LOW (ref >60)
Glucose, Bld: 129 mg/dL — ABNORMAL HIGH (ref 70–99)
Potassium: 3.9 mmol/L (ref 3.5–5.1)
Sodium: 141 mmol/L (ref 135–145)

## 2019-01-09 LAB — GLUCOSE, CAPILLARY
Glucose-Capillary: 125 mg/dL — ABNORMAL HIGH (ref 70–99)
Glucose-Capillary: 141 mg/dL — ABNORMAL HIGH (ref 70–99)
Glucose-Capillary: 127 mg/dL — ABNORMAL HIGH (ref 70–99)
Glucose-Capillary: 119 mg/dL — ABNORMAL HIGH (ref 70–99)

## 2019-01-09 MED ORDER — ADULT MULTIVITAMIN W/MINERALS CH
1.0000 | ORAL_TABLET | Freq: Every day | ORAL | Status: DC
  Administered 2019-01-09 – 2019-01-10 (×2): 1 via ORAL
  Filled 2019-01-09 (×2): qty 1

## 2019-01-09 NOTE — Progress Notes (Signed)
Nutrition Follow-up  RD working remotely.  DOCUMENTATION CODES:   Not applicable  INTERVENTION:   -Continue 500 mg vitamin C daily -Continue Boost Breeze po TID, each supplement provides 250 kcal and 9 grams of protein -MVI with minerals daily  NUTRITION DIAGNOSIS:   Increased nutrient needs related to post-op healing as evidenced by estimated needs.  Ongoing  GOAL:   Patient will meet greater than or equal to 90% of their needs  Progressing   MONITOR:   PO intake, Supplement acceptance, Skin, Weight trends, Labs, I & O's  REASON FOR ASSESSMENT:   Consult Assessment of nutrition requirement/status, Hip fracture protocol  ASSESSMENT:   85 y.o. female with medical history significant of non-insulin-dependent diabetes, degenerative disc disease lumbar spine, hypertension presents after fall. Pt with intertrochanteric fracture right hip  7/16- s/p IMN to right intertrochanteric hip fracture 7/20 restraints removed  Reviewed I/O's: +30 ml x 24 hours and +2.1 L since admission  UOP: 450 ml x 24 hours  Pt with some periods of confusion.   Intake has improved since visit, but remains variable (PO: 25-100%). Pt with variable acceptance of Boost Breeze supplements, however, accepted last two doses.   Noted last documented BM on 01/03/19. Pt on bowel regimen of colace and senokot.   Per CSW notes, awaiting peer to peer for discharge to SNF.   Labs reviewed: CBGS: 93-114 (inpatient orders for glycemic control are 0-15 units insulin aspart TID with meals  Diet Order:   Diet Order            Diet Carb Modified Fluid consistency: Thin; Room service appropriate? Yes  Diet effective now              EDUCATION NEEDS:   Not appropriate for education at this time  Skin:  Skin Assessment: Skin Integrity Issues: Skin Integrity Issues:: Incisions, Other (Comment) Incisions: closed rt leg Other: MASD perineum, head laceration  Last BM:  01/04/19  Height:   Ht  Readings from Last 1 Encounters:  01/01/19 5\' 3"  (1.6 m)    Weight:   Wt Readings from Last 1 Encounters:  01/01/19 54.4 kg    Ideal Body Weight:  52.27 kg  BMI:  Body mass index is 21.26 kg/m.  Estimated Nutritional Needs:   Kcal:  1450-1650  Protein:  55-65 grams  Fluid:  >/= 1.5 L/day    Kashton Mcartor A. Jimmye Norman, RD, LDN, [place] Registered Dietitian II Certified Diabetes Care and Education Specialist Pager: [phone] After hours Pager: [phone]

## 2019-01-09 NOTE — Progress Notes (Signed)
Occupational Therapy Treatment Patient Details Name: Lori Ellis MRN: [id number] DOB: [birth year] Today's Date: 01/09/2019    History of present illness Pt is a 85 y.o. female admitted 01/01/19 after fall sustaining R hip fx. S/p R hip IM nail 7/16. PMH includes DM, HTN, lumbar DDD.   OT comments  Pt slowly progressing toward OT goals and more agreeable to today's, however, increased irritability towards end of session. Pt required Mod A +2 for bed mobility to sit at EOB for grooming tasks. Initially min A of supported sitting, then min guard for safety when performing grooming tasks. Pt required less encouragement this session, however,resistive of returning back to bed towards end of session. DC and freq remains the same. OT will continue to follow acutely.    Follow Up Recommendations  SNF;Supervision/Assistance - 24 hour    Equipment Recommendations  3 in 1 bedside commode    Recommendations for Other Services      Precautions / Restrictions Precautions Precautions: Fall Restrictions Weight Bearing Restrictions: Yes RLE Weight Bearing: Weight bearing as tolerated       Mobility Bed Mobility Overal bed mobility: Needs Assistance Bed Mobility: Supine to Sit     Supine to sit: Mod assist;+2 for safety/equipment Sit to supine: Max assist;+2 for physical assistance   General bed mobility comments: patient initiated moving L LE to EOB assist for R LE and pulls up with rail (assist to find rail) and assist to scoot to EOB  Transfers Overall transfer level: Needs assistance               General transfer comment: session focused on bed mobility and grooming task in unsupported sitting.     Balance Overall balance assessment: Needs assistance   Sitting balance-Leahy Scale: Fair Sitting balance - Comments: initially unsteady, then able to balance after up for about a minute and reaching up to feel the staples in her head with out LOB     Standing balance-Leahy  Scale: Poor Standing balance comment: bilat UE support and A for balance                           ADL either performed or assessed with clinical judgement   ADL Overall ADL's : Needs assistance/impaired Eating/Feeding: Set up;Bed level   Grooming: Set up;Sitting                                 General ADL Comments: Pt demonstrated grooming task while seated at EOB wtih some supported sitting initally, then less assistance required. Less cues required and less encouraged required to exit bed, however, Max encouragement enter bed. Informed on why its important be supine in bed when alone for safety.       Vision       Perception     Praxis      Cognition Arousal/Alertness: Awake/alert Behavior During Therapy: WFL for tasks assessed/performed Overall Cognitive Status: History of cognitive impairments - at baseline                                 General Comments: Baseline dementia but able to redirect agitation.        Exercises     Shoulder Instructions       General Comments Son in room upon arrival.    Pertinent Vitals/ Pain  Pain Assessment: No/denies pain Pain Location: RLE Pain Descriptors / Indicators: Grimacing;Guarding Pain Intervention(s): Monitored during session;Repositioned  Home Living                                          Prior Functioning/Environment              Frequency  Min 2X/week        Progress Toward Goals  OT Goals(current goals can now be found in the care plan section)  Progress towards OT goals: Progressing toward goals  Acute Rehab OT Goals Patient Stated Goal: Go to the beach OT Goal Formulation: Patient unable to participate in goal setting Time For Goal Achievement: 01/17/19 Potential to Achieve Goals: Fair ADL Goals Pt Will Perform Grooming: with min assist;sitting Pt Will Perform Upper Body Dressing: with min assist;sitting Pt Will Transfer to  Toilet: stand pivot transfer;bedside commode;with mod assist;with +2 assist  Plan Discharge plan remains appropriate;Frequency remains appropriate    Co-evaluation                 AM-PAC OT "6 Clicks" Daily Activity     Outcome Measure   Help from another person eating meals?: A Little Help from another person taking care of personal grooming?: A Little Help from another person toileting, which includes using toliet, bedpan, or urinal?: A Lot Help from another person bathing (including washing, rinsing, drying)?: A Lot Help from another person to put on and taking off regular upper body clothing?: A Little Help from another person to put on and taking off regular lower body clothing?: A Lot 6 Click Score: 15    End of Session    OT Visit Diagnosis: Unsteadiness on feet (R26.81);Repeated falls (R29.6);Muscle weakness (generalized) (M62.81);Pain Pain - Right/Left: Right Pain - part of body: Hip   Activity Tolerance No increased pain(lethargic towards end of session.)   Patient Left in bed;with call bell/phone within reach;with bed alarm set;with restraints reapplied   Nurse Communication Mobility status;Weight bearing status        Time: 1435-1453 OT Time Calculation (min): 18 min  Charges: OT General Charges $OT Visit: 1 Visit OT Treatments $Self Care/Home Management : 8-22 mins  [place] OldEvan Jerran Tappan, MSOT, OTR/L  Supplemental Rehabilitation Services  [phone]    Zigmund Danielvan M Jovana Rembold 01/09/2019, 3:18 PM

## 2019-01-09 NOTE — Progress Notes (Addendum)
PROGRESS NOTE  Lori Ellis Karl ZOX:[id number] DOB: 04/22/1921 DOA: 01/01/2019 PCP: Malka SoJobe, Daniel Ellis., MD  Brief History    Lori Ellis Heitmeyer is a 85 y.o. year old female with medical history significant for non-insulin-dependent diabetes, degenerative disc disease lumbar spine, hypertension who presented on 01/01/2019 with mechanical fall (wheelchair-bound at baseline, does use walker according to her son) and was found to have right intertrochanteric femur fracture status post operative repair.  A & P   1. Right intertrochanteric hip fracture status post intramedullary nailing on 7/16.  Weightbearing as tolerated with walker, has been working with PT who recommends skilled nursing facility and has improved in the ability to rehab with PT on 7/23 as patient is much more alert and less lethargic.  Plans to go to skilled nursing facility upon bed availability on 7/24.  Tylenol for pain, minimize Norco for severe pain, follow-up with Dr. head in 3 weeks, daily dressing changes with 4 x 4's and tape, weightbearing as tolerated on right lower extremity  2. Hypertension, at goal.  Continue home Norvasc, lisinopril, propranolol.  3. Hypothyroidism, stable.  Continue home levothyroxine  4. Acute hospital-acquired delirium/encephalopathy, improving.  Continue nightly melatonin.  No longer in restraints, lethargy improved on diminished dose of Seroquel 25 q. evening.    5. AKI on CKD stage III, prerenal.  Improved with IV fluids, peak creatinine of 1.59 on admission, prior to that around 1-1 0.11.  Currently 1.3.  Will initiate low-dose fluids overnight.  To assist with hydration as creatinine did not improved with 24 hours of just oral intake-suspect this may be her new normal as patient does have evidence of chronic CKD.   Repeat BMP in 24 hours.  6. Leukocytosis last E. coli.  Completed 3-day course of ciprofloxacin.  7. Anemia of chronic disease.  Hemoglobin stable.  Had some element of hemodilution from IV  fluids.   DVT prophylaxis: Lovenox, to continue for 30 days Code Status: DNR Family Communication: will update son Peyton NajjarLarry Disposition Plan: PT recommends skilled nursing facility, plan to go to SNF on 7/24.    [facility] Direct contact: see [email or web address] (further directions at bottom of note if needed) 7PM-7AM contact night coverage as at bottom of note 01/09/2019, 3:49 PM  LOS: 8 days   Consultants  . Orthopedics  Procedures  . Intramedullary nailing of right intertrochanteric hip fracture on 7/16 by Dr. Carola FrostHandy  Antibiotics  . None  Interval History/Subjective  Much more alert this morning, no complaints  Objective   Vitals:  Vitals:   01/09/19 0812 01/09/19 1542  BP: 137/76 (!) 154/66  Pulse: 90 77  Resp: 16   Temp: 98.6 F (37 C)   SpO2: 97%     Exam:  Awake, alert, normal affect, denies pain Symmetrical Chest wall movement, Good air movement bilaterally, CTAB RRR,No Gallops,Rubs or new Murmurs, No Parasternal Heave +ve Ellis.Sounds, Abd Soft, No tenderness, No organomegaly appriciated, No rebound - guarding or rigidity. No Cyanosis, Clubbing or edema, No new Rash or bruise Right hip pain present Oriented to self, place, time, context   I have personally reviewed the following:   Today's Data   Lab Data   Recent Labs  Lab 01/04/19 0524 01/05/19 1011 01/06/19 0719 01/08/19 0540 01/09/19 0434  NA 141 140 142 141 141  K 3.6 3.4* 3.3* 3.9 3.9  CL 106 107 106 107 106  CO2 22 24 24 25 23   GLUCOSE 158* 224* 175* 119* 129*  BUN 41* 53* 32* 32*  28*  CREATININE 1.59* 1.58* 1.07* 1.29* 1.30*  CALCIUM 8.7* 8.3* 8.5* 8.3* 8.6*  MG 2.2  --   --   --   --    . Liver Function Tests: No results for input(s): AST, ALT, ALKPHOS, BILITOT, PROT, ALBUMIN in the last 168 hours. No results for input(s): LIPASE, AMYLASE in the last 168 hours. No results for input(s): AMMONIA in the last 168 hours. . CBC: Recent Labs  Lab 01/03/19 0426 01/04/19 0524 01/05/19  0752 01/06/19 0719 01/08/19 0540  WBC 16.7* 18.7* 14.7* 12.7* 10.2  HGB 10.9* 10.5* 8.9* 9.4* 9.1*  HCT 33.4* 33.0* 27.4* 29.5* 28.4*  MCV 98.5 100.0 100.7* 99.7 100.4*  PLT 198 189 210 213 243   . CBG: Recent Labs  Lab 01/08/19 1144 01/08/19 1704 01/08/19 2127 01/09/19 0859 01/09/19 1223  GLUCAP 203* 93 144* 125* 141*    Micro Data  . Urine culture, 7/18, E. coli  Imaging  . CT head, 7/29.  No acute intracranial maladies, atrophy, chronic microvascular disease . CT cervical spine degenerative disc and facet disease in the cervical spine, no acute bony abnormalities . Chest x-ray, 7/15 cardiomegaly, no active disease . X-ray hip/pelvis, 7/15 right proximal femur intertrochanteric fracture . Right x-ray, 7/16 moderate degenerative joint disease, no acute abnormalities . Hip x-ray, 7/16 right hip fracture repair . Pelvis x-ray 7/16 expected postsurgical changes following intramedullary nail placement right femur . Femur x-ray, surgical 16.  Expected postoperative appearance following open reduction, internal fixation of the right femur fracture. .   Cardiology Data  No results for input(s): CKTOTAL, CKMB, CKMBINDEX, TROPONINI in the last 168 hours.  Other Data  .   Scheduled Meds: . acetaminophen  500 mg Oral Q6H  . amLODipine  2.5 mg Oral QHS  . Chlorhexidine Gluconate Cloth  6 each Topical Daily  . cholecalciferol  2,000 Units Oral BID  . docusate sodium  100 mg Oral BID  . enoxaparin (LOVENOX) injection  30 mg Subcutaneous Q24H  . feeding supplement  1 Container Oral TID BM  . gabapentin  300 mg Oral QHS  . insulin aspart  0-15 Units Subcutaneous TID WC  . levothyroxine  75 mcg Oral QAC breakfast  . multivitamin with minerals  1 tablet Oral Daily  . mupirocin ointment  1 application Nasal BID  . propranolol  40 mg Oral Daily   And  . propranolol  20 mg Oral Q24H  . QUEtiapine  25 mg Oral QPM  . senna  1 tablet Oral BID  . vitamin C  500 mg Oral Daily    Continuous Infusions: . sodium chloride      Active Problems:   CKD (chronic kidney disease) stage 3, GFR 30-59 ml/min (HCC)   Hypothyroidism   Type II diabetes mellitus with renal manifestations (HCC)   Benign essential HTN   Closed intertrochanteric fracture, right, initial encounter ([place])   Atrial flutter by electrocardiogram ([place])   Lethargy   LOS: 8 days   Desiree Hane  [facility] How to contact the [facility] Attending or Consulting provider [place] or covering provider during after hours [place], for this patient?  1. Check the care team in [facility] and look for a) attending/consulting [facility] provider listed and Ellis) the [facility] team listed 2. Log into [email or web address] and use [facility]'s universal password to access. If you do not have the password, please contact the hospital operator. 3. Locate the [facility] provider you are looking for under [facility]  and page to a number that you can be directly reached. 4. If you still have difficulty reaching the provider, please page the [facility] (Director on Call) for the Hospitalists listed on amion for assistance.

## 2019-01-09 NOTE — Progress Notes (Signed)
Physical Therapy Treatment Patient Details Name: Lori Ellis MRN: [id number] DOB: 02/04/1921 Today's Date: 01/09/2019    History of Present Illness Pt is a 85 y.o. female admitted 01/01/19 after fall sustaining R hip fx. S/p R hip IM nail 7/16. PMH includes DM, HTN, lumbar DDD.    PT Comments    Patient progressing and more interactive with less assist for supine to sit this session.  R LE buckling and pain limiting mobility on her feet, but able to take steps to chair with A of 2.  Feel as pain subsides and able to participate with therex she will progress with tolerance and stability on her feet.  Remains appropriate for SNF level rehab.    Follow Up Recommendations  SNF;Supervision/Assistance - 24 hour     Equipment Recommendations  Other (comment)(TBA)    Recommendations for Other Services       Precautions / Restrictions Precautions Precautions: Fall Restrictions Weight Bearing Restrictions: Yes RLE Weight Bearing: Weight bearing as tolerated    Mobility  Bed Mobility Overal bed mobility: Needs Assistance Bed Mobility: Supine to Sit     Supine to sit: Mod assist;+2 for safety/equipment     General bed mobility comments: patient initiated moving L LE to EOB assist for R LE and pulls up with rail (assist to find rail) and assist to scoot to EOB  Transfers Overall transfer level: Needs assistance Equipment used: Rolling walker (2 wheeled) Transfers: Sit to/from UGI CorporationStand;Stand Pivot Transfers Sit to Stand: Mod assist;+2 physical assistance Stand pivot transfers: Mod assist;+2 physical assistance       General transfer comment: initiating up from EOB quickly and lifting assist of two to steady, standing stepping with R LE buckling, pt reports can't feel that it is under her, but did take steps with A of 2 and RW  Ambulation/Gait                 Stairs             Wheelchair Mobility    Modified Rankin (Stroke Patients Only)       Balance  Overall balance assessment: Needs assistance   Sitting balance-Leahy Scale: Fair Sitting balance - Comments: initially unsteady, then able to balance after up for about a minute and reaching up to feel the staples in her head with out LOB     Standing balance-Leahy Scale: Poor Standing balance comment: bilat UE support and A for balance                            Cognition Arousal/Alertness: Awake/alert Behavior During Therapy: WFL for tasks assessed/performed Overall Cognitive Status: History of cognitive impairments - at baseline                                        Exercises General Exercises - Lower Extremity Ankle Circles/Pumps: 10 reps;AROM;Both;Seated Short Arc Quad: AROM;5 reps;Seated;Both Hip Flexion/Marching: AROM;5 reps;Seated;Both    General Comments General comments (skin integrity, edema, etc.): son in the room encouraging pt and continuing his attempts to explain to her why it is important to get up and participate      Pertinent Vitals/Pain Faces Pain Scale: Hurts little more Pain Location: RLE Pain Descriptors / Indicators: Grimacing;Guarding Pain Intervention(s): Monitored during session;Repositioned;Limited activity within patient's tolerance    Home Living  Prior Function            PT Goals (current goals can now be found in the care plan section) Progress towards PT goals: Progressing toward goals    Frequency    Min 3X/week      PT Plan Current plan remains appropriate    Co-evaluation              AM-PAC PT "6 Clicks" Mobility   Outcome Measure  Help needed turning from your back to your side while in a flat bed without using bedrails?: A Lot Help needed moving from lying on your back to sitting on the side of a flat bed without using bedrails?: A Lot Help needed moving to and from a bed to a chair (including a wheelchair)?: Total Help needed standing up from a chair  using your arms (e.g., wheelchair or bedside chair)?: Total Help needed to walk in hospital room?: Total Help needed climbing 3-5 steps with a railing? : Total 6 Click Score: 8    End of Session Equipment Utilized During Treatment: Gait belt Activity Tolerance: Patient tolerated treatment well Patient left: in chair;with call bell/phone within reach;with chair alarm set;with family/visitor present   PT Visit Diagnosis: Other abnormalities of gait and mobility (R26.89);Muscle weakness (generalized) (M62.81)     Time: 1027-1050 PT Time Calculation (min) (ACUTE ONLY): 23 min  Charges:  $Therapeutic Exercise: 8-22 mins $Therapeutic Activity: 8-22 mins                     Magda Kiel, [place] Acute Rehabilitation Services [phone] 01/09/2019    Reginia Naas 01/09/2019, 11:01 AM

## 2019-01-09 NOTE — Plan of Care (Signed)

## 2019-01-10 DIAGNOSIS — I10 Essential (primary) hypertension: Secondary | ICD-10-CM | POA: Diagnosis not present

## 2019-01-10 DIAGNOSIS — R5381 Other malaise: Secondary | ICD-10-CM | POA: Diagnosis not present

## 2019-01-10 DIAGNOSIS — S72144D Nondisplaced intertrochanteric fracture of right femur, subsequent encounter for closed fracture with routine healing: Secondary | ICD-10-CM | POA: Diagnosis not present

## 2019-01-10 DIAGNOSIS — Z7401 Bed confinement status: Secondary | ICD-10-CM | POA: Diagnosis not present

## 2019-01-10 DIAGNOSIS — S72141A Displaced intertrochanteric fracture of right femur, initial encounter for closed fracture: Secondary | ICD-10-CM | POA: Diagnosis not present

## 2019-01-10 DIAGNOSIS — N183 Chronic kidney disease, stage 3 (moderate): Secondary | ICD-10-CM | POA: Diagnosis not present

## 2019-01-10 DIAGNOSIS — E039 Hypothyroidism, unspecified: Secondary | ICD-10-CM | POA: Diagnosis not present

## 2019-01-10 DIAGNOSIS — F0391 Unspecified dementia with behavioral disturbance: Secondary | ICD-10-CM | POA: Diagnosis not present

## 2019-01-10 DIAGNOSIS — K219 Gastro-esophageal reflux disease without esophagitis: Secondary | ICD-10-CM | POA: Diagnosis not present

## 2019-01-10 DIAGNOSIS — E1122 Type 2 diabetes mellitus with diabetic chronic kidney disease: Secondary | ICD-10-CM | POA: Diagnosis not present

## 2019-01-10 DIAGNOSIS — H3530 Unspecified macular degeneration: Secondary | ICD-10-CM | POA: Diagnosis not present

## 2019-01-10 DIAGNOSIS — R4182 Altered mental status, unspecified: Secondary | ICD-10-CM | POA: Diagnosis not present

## 2019-01-10 DIAGNOSIS — M4807 Spinal stenosis, lumbosacral region: Secondary | ICD-10-CM | POA: Diagnosis not present

## 2019-01-10 DIAGNOSIS — M2550 Pain in unspecified joint: Secondary | ICD-10-CM | POA: Diagnosis not present

## 2019-01-10 DIAGNOSIS — I959 Hypotension, unspecified: Secondary | ICD-10-CM | POA: Diagnosis not present

## 2019-01-10 DIAGNOSIS — E119 Type 2 diabetes mellitus without complications: Secondary | ICD-10-CM | POA: Diagnosis not present

## 2019-01-10 DIAGNOSIS — K5900 Constipation, unspecified: Secondary | ICD-10-CM | POA: Diagnosis not present

## 2019-01-10 DIAGNOSIS — W19XXXD Unspecified fall, subsequent encounter: Secondary | ICD-10-CM | POA: Diagnosis not present

## 2019-01-10 DIAGNOSIS — R52 Pain, unspecified: Secondary | ICD-10-CM | POA: Diagnosis not present

## 2019-01-10 DIAGNOSIS — S72144A Nondisplaced intertrochanteric fracture of right femur, initial encounter for closed fracture: Secondary | ICD-10-CM | POA: Diagnosis not present

## 2019-01-10 DIAGNOSIS — R627 Adult failure to thrive: Secondary | ICD-10-CM | POA: Diagnosis not present

## 2019-01-10 DIAGNOSIS — M25551 Pain in right hip: Secondary | ICD-10-CM | POA: Diagnosis not present

## 2019-01-10 DIAGNOSIS — Z419 Encounter for procedure for purposes other than remedying health state, unspecified: Secondary | ICD-10-CM | POA: Diagnosis not present

## 2019-01-10 DIAGNOSIS — R2689 Other abnormalities of gait and mobility: Secondary | ICD-10-CM | POA: Diagnosis not present

## 2019-01-10 DIAGNOSIS — R451 Restlessness and agitation: Secondary | ICD-10-CM | POA: Diagnosis not present

## 2019-01-10 DIAGNOSIS — F05 Delirium due to known physiological condition: Secondary | ICD-10-CM | POA: Diagnosis not present

## 2019-01-10 DIAGNOSIS — M6281 Muscle weakness (generalized): Secondary | ICD-10-CM | POA: Diagnosis not present

## 2019-01-10 DIAGNOSIS — I4892 Unspecified atrial flutter: Secondary | ICD-10-CM | POA: Diagnosis not present

## 2019-01-10 LAB — GLUCOSE, CAPILLARY
Glucose-Capillary: 131 mg/dL — ABNORMAL HIGH (ref 70–99)
Glucose-Capillary: 114 mg/dL — ABNORMAL HIGH (ref 70–99)
Glucose-Capillary: 160 mg/dL — ABNORMAL HIGH (ref 70–99)

## 2019-01-10 MED ORDER — QUETIAPINE FUMARATE 25 MG PO TABS: 25.0000 mg | ORAL_TABLET | Freq: Every evening | ORAL | Status: DC

## 2019-01-10 MED ORDER — LEVOTHYROXINE SODIUM 75 MCG PO TABS: 75.0000 ug | ORAL_TABLET | Freq: Every day | ORAL | Status: DC

## 2019-01-10 MED ORDER — AMLODIPINE BESYLATE 2.5 MG PO TABS: 2.5000 mg | ORAL_TABLET | Freq: Every day | ORAL | Status: DC

## 2019-01-10 MED ORDER — SENNA 8.6 MG PO TABS: 1.0000 | ORAL_TABLET | Freq: Two times a day (BID) | ORAL | 0 refills | Status: DC

## 2019-01-10 MED ORDER — LISINOPRIL 20 MG PO TABS: ORAL_TABLET | ORAL | Status: DC

## 2019-01-10 MED ORDER — GABAPENTIN 300 MG PO CAPS: 300.0000 mg | ORAL_CAPSULE | Freq: Every day | ORAL | Status: DC

## 2019-01-10 MED ORDER — PROPRANOLOL HCL 20 MG PO TABS: 20.0000 mg | ORAL_TABLET | ORAL | Status: DC

## 2019-01-10 MED ORDER — MELATONIN 3 MG PO TABS: 3.0000 mg | ORAL_TABLET | Freq: Every evening | ORAL | 0 refills | Status: DC | PRN

## 2019-01-10 NOTE — Progress Notes (Signed)
Returned 1 gold ring to [employer].

## 2019-01-10 NOTE — Progress Notes (Signed)
Called facility, gave report to Lori Ellis. Pt's son Lori Ellis at bedside, all questions and concerns addressed, Pt not in distress, discharge to facility with belongings via Lori Ellis.

## 2019-01-10 NOTE — Discharge Summary (Signed)
Lori Ellis ZOX:[id number] DOB: 1921-01-24 DOA: 01/01/2019  PCP: Malka So., MD  Admit date: 01/01/2019 Discharge date: 01/10/2019  Admitted From: Home Disposition:  SNF  Recommendations for Outpatient Follow-up:  1. Follow up with PCP in 1-2 weeks. Follow up with Dr. Carola Ellis ( orthopedics) in 3 weeks 2. Please obtain BMP/CBC in one week 3. Please hold lisinopril until creatinine check.   Home Health:NO Equipment/Devices:NO Discharge Condition:STABLE CODE STATUS:DNR Diet recommendation:  Carb Modified   Brief/Interim Summary: History of present illness:  Lori Ellis is a 85 y.o. year old female with medical history significant for non-insulin-dependent diabetes, degenerative disc disease lumbar spine, hypertension who presented on 01/01/2019 with mechanical fall (wheelchair-bound at baseline, does use walker according to her son) and was found to have right intertrochanteric femur fracture status post operative repair. Remaining hospital course addressed in problem based format below:   Hospital Course:   1. Right intertrochanteric hip fracture status post intramedullary nailing on 7/16.  Weightbearing as tolerated with walker, has been working with PT who recommends skilled nursing facility and has improved in the ability to rehab with PT on 7/23 as patient is much more alert and less lethargic.  Discharge to skilled nursing facility for continued  rehab.  Scheduled Tylenol for pain, no opioids on discharge to avoid delirium/agitation, follow-up with Dr. Carola Ellis ( orthopedics) in 3 weeks, daily dressing changes with 4 x 4's and tape, weightbearing as tolerated on right lower extremity  2. Hypertension, at goal.  Continue home Norvasc, propranolol. Hold home lisinopril until BMP check to ensure stability of Creatinine  3. Hypothyroidism, stable.  Continue home levothyroxine  4. Acute hospital-acquired delirium/encephalopathy, improving.  Continue nightly melatonin.  Briefly  required restraints but has been without for > 48 hours. Continue Seroquel 25 q. evening.    5. AKI on CKD stage III, prerenal.  Improved with IV fluids, peak creatinine of 1.59 on admission, prior to that around 1-1.11.  Cr 1.3 on discharge. Hold lisinopril on discharge until repeat BMP in 3-5 days.  6. Leukocytosis with E. coli.  Completed 3-day course of ciprofloxacin.  7. Anemia of chronic disease.  Hemoglobin stable.  Had some element of hemodilution from IV fluids.  8. Type 2 DM, well controlled. No meds. A1c 5.9    Consultations:  Orthopedics  Procedures/Studies:  Intramedullary nailing of right intertrochanteric hip fracture on 7/16 by Dr. Carola Ellis Subjective: No acute events overnight. Resting comfortably in bed this morning  Discharge Exam: Vitals:   01/10/19 0300 01/10/19 0805  BP: 132/69 126/65  Pulse: 84 77  Resp: 14 17  Temp: 98.8 F (37.1 C) 98.4 F (36.9 C)  SpO2: 100% 99%   Vitals:   01/09/19 1542 01/09/19 1946 01/10/19 0300 01/10/19 0805  BP: (!) 154/66 (!) 148/55 132/69 126/65  Pulse: 77 79 84 77  Resp:  Temp:  98.2 F (36.8 C) 98.8 F (37.1 C) 98.4 F (36.9 C)  TempSrc:  Oral Oral Oral  SpO2:  99% 100% 99%  Weight:      Height:        General: Lying in bed, no apparent distress Eyes: EOMI, anicteric Cardiovascular: regular rate and rhythm, no murmurs, rubs or gallops, no edema, Respiratory: Normal respiratory effort, lungs clear to auscultation bilaterally Abdomen: soft, non-distended, non-tender, normal bowel sounds Skin: No Rash Neurologic: Grossly no focal neuro deficit. Oriented to self, place, time, context   Discharge Diagnoses:  Active Problems:   CKD (chronic kidney disease)  stage 3, GFR 30-59 ml/min (HCC)   Hypothyroidism   Type II diabetes mellitus with renal manifestations (HCC)   Benign essential HTN   Closed intertrochanteric fracture, right, initial encounter [facility])   Atrial flutter by electrocardiogram [facility])    Lethargy    Discharge Instructions  Discharge Instructions    Diet - low sodium heart healthy   Complete by: As directed    Increase activity slowly   Complete by: As directed      Allergies as of 01/10/2019      Reactions   Fentanyl Other (See Comments)   Severe delusions   Penicillins Hives, Rash   Did it involve swelling of the face/tongue/throat, SOB, or low BP? Unk Did it involve sudden or severe rash/hives, skin peeling, or any reaction on the inside of your mouth or nose? Unk Did you need to seek medical attention at a hospital or doctor's office? Unk When did it last happen? "A long time ago" If all above answers are "NO", may proceed with cephalosporin use.   Tetanus Toxoid Hives   Adhesive [tape] Other (See Comments)   TEARS THE SKIN!!   Advil [ibuprofen] Other (See Comments)   Was told by MD to not take   Hydrochlorothiazide Other (See Comments)   Hyponatremia    Naproxen Sodium Rash      Medication List    STOP taking these medications   docusate sodium 100 MG capsule Commonly known as: COLACE   feeding supplement (PRO-STAT SUGAR FREE 64) Liqd   ibuprofen 400 MG tablet Commonly known as: ADVIL   ondansetron 4 MG disintegrating tablet Commonly known as: Zofran ODT   polyethylene glycol 17 g packet Commonly known as: MIRALAX / GLYCOLAX   senna-docusate 8.6-50 MG tablet Commonly known as: Senokot-S   traMADol 50 MG tablet Commonly known as: ULTRAM     TAKE these medications   acetaminophen 325 MG tablet Commonly known as: TYLENOL Take 2 tablets (650 mg total) by mouth every 6 (six) hours as needed for mild pain. What changed: Another medication with the same name was removed. Continue taking this medication, and follow the directions you see here.   amLODipine 2.5 MG tablet Commonly known as: NORVASC Take 1 tablet (2.5 mg total) by mouth at bedtime.   ascorbic acid 500 MG tablet Commonly known as: VITAMIN C Take 1 tablet (500 mg total) by  mouth daily.   enoxaparin 30 MG/0.3ML injection Commonly known as: LOVENOX Inject 0.3 mLs (30 mg total) into the skin daily.   gabapentin 300 MG capsule Commonly known as: NEURONTIN Take 1 capsule (300 mg total) by mouth at bedtime.   levothyroxine 75 MCG tablet Commonly known as: SYNTHROID Take 1 tablet (75 mcg total) by mouth daily before breakfast.   lisinopril 20 MG tablet Commonly known as: ZESTRIL HOLD until BMP check by doctor What changed:   how much to take  how to take this  when to take this  additional instructions  Another medication with the same name was removed. Continue taking this medication, and follow the directions you see here.   Melatonin 3 MG Tabs Take 1 tablet (3 mg total) by mouth at bedtime as needed (sleep).   PreserVision AREDS 2 Caps Take 1 capsule by mouth every evening.   Centrum Silver 50+Women Tabs Take 1 tablet by mouth daily with lunch.   propranolol 20 MG tablet Commonly known as: INDERAL Take 1-2 tablets (20-40 mg total) by mouth See admin instructions. Take 40 mg  by mouth in the morning and 20 mg at 4:30 PM   QUEtiapine 25 MG tablet Commonly known as: SEROQUEL Take 1 tablet (25 mg total) by mouth every evening.   senna 8.6 MG Tabs tablet Commonly known as: SENOKOT Take 1 tablet (8.6 mg total) by mouth 2 (two) times daily.   Vitamin D3 25 MCG tablet Commonly known as: Vitamin D Take 2 tablets (2,000 Units total) by mouth 2 (two) times daily.       Contact information for follow-up providers    Myrene GalasHandy, Michael, MD. Schedule an appointment as soon as possible for a visit in 3 week(s).   Specialty: Orthopedic Surgery Contact information: [address] [place] [address] [phone]            Contact information for after-discharge care    Destination    [facility] Preferred SNF .   Service: Skilled Nursing Contact information: [address] [place]  [id number] [phone]                 Allergies  Allergen Reactions  . Fentanyl Other (See Comments)    Severe delusions   . Penicillins Hives and Rash    Did it involve swelling of the face/tongue/throat, SOB, or low BP? Unk Did it involve sudden or severe rash/hives, skin peeling, or any reaction on the inside of your mouth or nose? Unk Did you need to seek medical attention at a hospital or doctor's office? Unk When did it last happen? "A long time ago" If all above answers are "NO", may proceed with cephalosporin use.   . Tetanus Toxoid Hives  . Adhesive [Tape] Other (See Comments)    TEARS THE SKIN!!  . Advil [Ibuprofen] Other (See Comments)    Was told by MD to not take  . Hydrochlorothiazide Other (See Comments)    Hyponatremia   . Naproxen Sodium Rash        The results of significant diagnostics from this hospitalization (including imaging, microbiology, ancillary and laboratory) are listed below for reference.     Microbiology: Recent Results (from the past 240 hour(s))  SARS Coronavirus 2 (CEPHEID - Performed in [facility] hospital lab), Hosp Order     Status: None   Collection Time: 01/01/19  7:01 PM   Specimen: Nasopharyngeal Swab  Result Value Ref Range Status   SARS Coronavirus 2 NEGATIVE NEGATIVE Final    Comment: (NOTE) If result is NEGATIVE SARS-CoV-2 target nucleic acids are NOT DETECTED. The SARS-CoV-2 RNA is generally detectable in upper and lower  respiratory specimens during the acute phase of infection. The lowest  concentration of SARS-CoV-2 viral copies this assay can detect is 250  copies / mL. A negative result does not preclude SARS-CoV-2 infection  and should not be used as the sole basis for treatment or other  patient management decisions.  A negative result may occur with  improper specimen collection / handling, submission of specimen other  than nasopharyngeal swab, presence of viral mutation(s) within the  areas targeted by  this assay, and inadequate number of viral copies  (<250 copies / mL). A negative result must be combined with clinical  observations, patient history, and epidemiological information. If result is POSITIVE SARS-CoV-2 target nucleic acids are DETECTED. The SARS-CoV-2 RNA is generally detectable in upper and lower  respiratory specimens dur ing the acute phase of infection.  Positive  results are indicative of active infection with SARS-CoV-2.  Clinical  correlation with patient history  and other diagnostic information is  necessary to determine patient infection status.  Positive results do  not rule out bacterial infection or co-infection with other viruses. If result is PRESUMPTIVE POSTIVE SARS-CoV-2 nucleic acids MAY BE PRESENT.   A presumptive positive result was obtained on the submitted specimen  and confirmed on repeat testing.  While 2019 novel coronavirus  (SARS-CoV-2) nucleic acids may be present in the submitted sample  additional confirmatory testing may be necessary for epidemiological  and / or clinical management purposes  to differentiate between  SARS-CoV-2 and other Sarbecovirus currently known to infect humans.  If clinically indicated additional testing with an alternate test  methodology [phone]) is advised. The SARS-CoV-2 RNA is generally  detectable in upper and lower respiratory sp ecimens during the acute  phase of infection. The expected result is Negative. Fact Sheet for Patients:  BoilerBrush.com.cy Fact Sheet for Healthcare Providers: https://pope.com/ This test is not yet approved or cleared by the [place] FDA and has been authorized for detection and/or diagnosis of SARS-CoV-2 by FDA under an Emergency Use Authorization (EUA).  This EUA will remain in effect (meaning this test can be used) for the duration of the COVID-19 declaration under Section 564(b)(1) of the Act, 21 U.S.C. section  360bbb-3(b)(1), unless the authorization is terminated or revoked sooner. Performed at [facility], [address] [place], [place] [address]   Surgical pcr screen     Status: Abnormal   Collection Time: 01/01/19  9:17 PM   Specimen: Nasal Mucosa; Nasal Swab  Result Value Ref Range Status   MRSA, PCR NEGATIVE NEGATIVE Final   Staphylococcus aureus POSITIVE (A) NEGATIVE Final    Comment: (NOTE) The Xpert SA Assay (FDA approved for NASAL specimens in patients 44 years of age and older), is one component of a comprehensive surveillance program. It is not intended to diagnose infection nor to guide or monitor treatment. Performed at [facility], [address] [place], [place] [address]   Novel Coronavirus, NAA (hospital order; send-out to ref lab)     Status: None   Collection Time: 01/04/19  5:00 AM   Specimen: Nasopharyngeal Swab; Respiratory  Result Value Ref Range Status   SARS-CoV-2, NAA NOT DETECTED NOT DETECTED Final    Comment: (NOTE) This test was developed and its performance characteristics determined by [employer]. This test has not been FDA cleared or approved. This test has been authorized by FDA under an Emergency Use Authorization (EUA). This test is only authorized for the duration of time the declaration that circumstances exist justifying the authorization of the emergency use of in vitro diagnostic tests for detection of SARS-CoV-2 virus and/or diagnosis of COVID-19 infection under section 564(b)(1) of the Act, 21 U.S.C. 478GNF-6(O)(1), unless the authorization is terminated or revoked sooner. When diagnostic testing is negative, the possibility of a false negative result should be considered in the context of a patient's recent exposures and the presence of clinical signs and symptoms consistent with COVID-19. An individual without symptoms of COVID-19 and who is not shedding SARS-CoV-2 virus would expect to have a negative (not  detected) result in this assay. Performed  At: [facility] [address] [place], [place] [id number] Jolene Schimke MD NG:[id number]    Coronavirus Source NASOPHARYNGEAL  Final    Comment: Performed at [facility], [address] [place], [place] [address]  Culture, Urine     Status: Abnormal   Collection Time: 01/04/19 12:47 PM   Specimen:  Urine, Clean Catch  Result Value Ref Range Status   Specimen Description URINE, CLEAN CATCH  Final   Special Requests   Final    NONE Performed at [facility], [address] [place], [place] [address]    Culture >=100,000 COLONIES/mL ESCHERICHIA COLI (A)  Final   Report Status 01/06/2019 FINAL  Final   Organism ID, Bacteria ESCHERICHIA COLI (A)  Final      Susceptibility   Escherichia coli - MIC*    AMPICILLIN 4 SENSITIVE Sensitive     CEFAZOLIN <=4 SENSITIVE Sensitive     CEFTRIAXONE <=1 SENSITIVE Sensitive     CIPROFLOXACIN <=0.25 SENSITIVE Sensitive     GENTAMICIN <=1 SENSITIVE Sensitive     IMIPENEM <=0.25 SENSITIVE Sensitive     NITROFURANTOIN <=16 SENSITIVE Sensitive     TRIMETH/SULFA <=20 SENSITIVE Sensitive     AMPICILLIN/SULBACTAM <=2 SENSITIVE Sensitive     PIP/TAZO <=4 SENSITIVE Sensitive     Extended ESBL NEGATIVE Sensitive     * >=100,000 COLONIES/mL ESCHERICHIA COLI  SARS Coronavirus 2 (CEPHEID - Performed in [facility] hospital lab), Hosp Order     Status: None   Collection Time: 01/08/19  1:20 PM   Specimen: Nasopharyngeal Swab  Result Value Ref Range Status   SARS Coronavirus 2 NEGATIVE NEGATIVE Final    Comment: (NOTE) If result is NEGATIVE SARS-CoV-2 target nucleic acids are NOT DETECTED. The SARS-CoV-2 RNA is generally detectable in upper and lower  respiratory specimens during the acute phase of infection. The lowest  concentration of SARS-CoV-2 viral copies this assay can detect is 250  copies / mL. A negative result does not preclude SARS-CoV-2 infection  and should not be used as the  sole basis for treatment or other  patient management decisions.  A negative result may occur with  improper specimen collection / handling, submission of specimen other  than nasopharyngeal swab, presence of viral mutation(s) within the  areas targeted by this assay, and inadequate number of viral copies  (<250 copies / mL). A negative result must be combined with clinical  observations, patient history, and epidemiological information. If result is POSITIVE SARS-CoV-2 target nucleic acids are DETECTED. The SARS-CoV-2 RNA is generally detectable in upper and lower  respiratory specimens dur ing the acute phase of infection.  Positive  results are indicative of active infection with SARS-CoV-2.  Clinical  correlation with patient history and other diagnostic information is  necessary to determine patient infection status.  Positive results do  not rule out bacterial infection or co-infection with other viruses. If result is PRESUMPTIVE POSTIVE SARS-CoV-2 nucleic acids MAY BE PRESENT.   A presumptive positive result was obtained on the submitted specimen  and confirmed on repeat testing.  While 2019 novel coronavirus  (SARS-CoV-2) nucleic acids may be present in the submitted sample  additional confirmatory testing may be necessary for epidemiological  and / or clinical management purposes  to differentiate between  SARS-CoV-2 and other Sarbecovirus currently known to infect humans.  If clinically indicated additional testing with an alternate test  methodology [phone]) is advised. The SARS-CoV-2 RNA is generally  detectable in upper and lower respiratory sp ecimens during the acute  phase of infection. The expected result is Negative. Fact Sheet for Patients:  BoilerBrush.com.cy Fact Sheet for Healthcare Providers: https://pope.com/ This test is not yet approved or cleared by the [place] FDA and has been authorized for detection  and/or diagnosis of SARS-CoV-2 by FDA under an Emergency Use Authorization (EUA).  This EUA will remain in effect (meaning this test can be used) for the duration of the COVID-19 declaration under Section 564(b)(1) of the Act, 21 U.S.C. section 360bbb-3(b)(1), unless the authorization is terminated or revoked sooner. Performed at [facility], [address] [place], [place] [address]      Labs: BNP (last 3 results) No results for input(s): BNP in the last 8760 hours. Basic Metabolic Panel: Recent Labs  Lab 01/04/19 0524 01/05/19 1011 01/06/19 0719 01/08/19 0540 01/09/19 0434  NA 141 140 142 141 141  K 3.6 3.4* 3.3* 3.9 3.9  CL 106 107 106 107 106  CO2 22 24 24 25 23   GLUCOSE 158* 224* 175* 119* 129*  BUN 41* 53* 32* 32* 28*  CREATININE 1.59* 1.58* 1.07* 1.29* 1.30*  CALCIUM 8.7* 8.3* 8.5* 8.3* 8.6*  MG 2.2  --   --   --   --    Liver Function Tests: No results for input(s): AST, ALT, ALKPHOS, BILITOT, PROT, ALBUMIN in the last 168 hours. No results for input(s): LIPASE, AMYLASE in the last 168 hours. No results for input(s): AMMONIA in the last 168 hours. CBC: Recent Labs  Lab 01/04/19 0524 01/05/19 0752 01/06/19 0719 01/08/19 0540  WBC 18.7* 14.7* 12.7* 10.2  HGB 10.5* 8.9* 9.4* 9.1*  HCT 33.0* 27.4* 29.5* 28.4*  MCV 100.0 100.7* 99.7 100.4*  PLT 189 210 213 243   Cardiac Enzymes: No results for input(s): CKTOTAL, CKMB, CKMBINDEX, TROPONINI in the last 168 hours. BNP: Invalid input(s): POCBNP CBG: Recent Labs  Lab 01/09/19 0859 01/09/19 1223 01/09/19 1644 01/09/19 2201 01/10/19 0802  GLUCAP 125* 141* 127* 119* 131*   D-Dimer No results for input(s): DDIMER in the last 72 hours. Hgb A1c No results for input(s): HGBA1C in the last 72 hours. Lipid Profile No results for input(s): CHOL, HDL, LDLCALC, TRIG, CHOLHDL, LDLDIRECT in the last 72 hours. Thyroid function studies No results for input(s): TSH, T4TOTAL, T3FREE, THYROIDAB in the last 72  hours.  Invalid input(s): FREET3 Anemia work up No results for input(s): VITAMINB12, FOLATE, FERRITIN, TIBC, IRON, RETICCTPCT in the last 72 hours. Urinalysis    Component Value Date/Time   COLORURINE AMBER (A) 01/04/2019 1256   APPEARANCEUR CLOUDY (A) 01/04/2019 1256   LABSPEC 1.021 01/04/2019 1256   PHURINE 5.0 01/04/2019 1256   GLUCOSEU NEGATIVE 01/04/2019 1256   HGBUR MODERATE (A) 01/04/2019 1256   BILIRUBINUR NEGATIVE 01/04/2019 1256   KETONESUR 5 (A) 01/04/2019 1256   PROTEINUR 100 (A) 01/04/2019 1256   NITRITE NEGATIVE 01/04/2019 1256   LEUKOCYTESUR LARGE (A) 01/04/2019 1256   Sepsis Labs Invalid input(s): PROCALCITONIN,  WBC,  LACTICIDVEN Microbiology Recent Results (from the past 240 hour(s))  SARS Coronavirus 2 (CEPHEID - Performed in [facility] hospital lab), Hosp Order     Status: None   Collection Time: 01/01/19  7:01 PM   Specimen: Nasopharyngeal Swab  Result Value Ref Range Status   SARS Coronavirus 2 NEGATIVE NEGATIVE Final    Comment: (NOTE) If result is NEGATIVE SARS-CoV-2 target nucleic acids are NOT DETECTED. The SARS-CoV-2 RNA is generally detectable in upper and lower  respiratory specimens during the acute phase of infection. The lowest  concentration of SARS-CoV-2 viral copies this assay can detect is 250  copies / mL. A negative result does not preclude SARS-CoV-2 infection  and should not be used as the sole basis for treatment or other  patient management decisions.  A negative result may occur with  improper specimen  collection / handling, submission of specimen other  than nasopharyngeal swab, presence of viral mutation(s) within the  areas targeted by this assay, and inadequate number of viral copies  (<250 copies / mL). A negative result must be combined with clinical  observations, patient history, and epidemiological information. If result is POSITIVE SARS-CoV-2 target nucleic acids are DETECTED. The SARS-CoV-2 RNA is generally detectable  in upper and lower  respiratory specimens dur ing the acute phase of infection.  Positive  results are indicative of active infection with SARS-CoV-2.  Clinical  correlation with patient history and other diagnostic information is  necessary to determine patient infection status.  Positive results do  not rule out bacterial infection or co-infection with other viruses. If result is PRESUMPTIVE POSTIVE SARS-CoV-2 nucleic acids MAY BE PRESENT.   A presumptive positive result was obtained on the submitted specimen  and confirmed on repeat testing.  While 2019 novel coronavirus  (SARS-CoV-2) nucleic acids may be present in the submitted sample  additional confirmatory testing may be necessary for epidemiological  and / or clinical management purposes  to differentiate between  SARS-CoV-2 and other Sarbecovirus currently known to infect humans.  If clinically indicated additional testing with an alternate test  methodology [phone]) is advised. The SARS-CoV-2 RNA is generally  detectable in upper and lower respiratory sp ecimens during the acute  phase of infection. The expected result is Negative. Fact Sheet for Patients:  BoilerBrush.com.cy Fact Sheet for Healthcare Providers: https://pope.com/ This test is not yet approved or cleared by the [place] FDA and has been authorized for detection and/or diagnosis of SARS-CoV-2 by FDA under an Emergency Use Authorization (EUA).  This EUA will remain in effect (meaning this test can be used) for the duration of the COVID-19 declaration under Section 564(b)(1) of the Act, 21 U.S.C. section 360bbb-3(b)(1), unless the authorization is terminated or revoked sooner. Performed at [facility], [address] [place], [place] [address]   Surgical pcr screen     Status: Abnormal   Collection Time: 01/01/19  9:17 PM   Specimen: Nasal Mucosa; Nasal Swab  Result Value Ref Range Status    MRSA, PCR NEGATIVE NEGATIVE Final   Staphylococcus aureus POSITIVE (A) NEGATIVE Final    Comment: (NOTE) The Xpert SA Assay (FDA approved for NASAL specimens in patients 43 years of age and older), is one component of a comprehensive surveillance program. It is not intended to diagnose infection nor to guide or monitor treatment. Performed at [facility], [address] [place], [place] [address]   Novel Coronavirus, NAA (hospital order; send-out to ref lab)     Status: None   Collection Time: 01/04/19  5:00 AM   Specimen: Nasopharyngeal Swab; Respiratory  Result Value Ref Range Status   SARS-CoV-2, NAA NOT DETECTED NOT DETECTED Final    Comment: (NOTE) This test was developed and its performance characteristics determined by [employer]. This test has not been FDA cleared or approved. This test has been authorized by FDA under an Emergency Use Authorization (EUA). This test is only authorized for the duration of time the declaration that circumstances exist justifying the authorization of the emergency use of in vitro diagnostic tests for detection of SARS-CoV-2 virus and/or diagnosis of COVID-19 infection under section 564(b)(1) of the Act, 21 U.S.C. 478GNF-6(O)(1), unless the authorization is terminated or revoked sooner. When diagnostic testing is negative, the possibility of a false negative result should be considered in the context of a patient's recent  exposures and the presence of clinical signs and symptoms consistent with COVID-19. An individual without symptoms of COVID-19 and who is not shedding SARS-CoV-2 virus would expect to have a negative (not detected) result in this assay. Performed  At: [facility] [address] [place], [place] [id number] Jolene SchimkeNagendra Sanjai MD OZ:[id number]:[phone]    Coronavirus Source NASOPHARYNGEAL  Final    Comment: Performed at [facility], [address] [place], [place] [address]  Culture, Urine     Status:  Abnormal   Collection Time: 01/04/19 12:47 PM   Specimen: Urine, Clean Catch  Result Value Ref Range Status   Specimen Description URINE, CLEAN CATCH  Final   Special Requests   Final    NONE Performed at [facility], [address] [place], [place] [address]    Culture >=100,000 COLONIES/mL ESCHERICHIA COLI (A)  Final   Report Status 01/06/2019 FINAL  Final   Organism ID, Bacteria ESCHERICHIA COLI (A)  Final      Susceptibility   Escherichia coli - MIC*    AMPICILLIN 4 SENSITIVE Sensitive     CEFAZOLIN <=4 SENSITIVE Sensitive     CEFTRIAXONE <=1 SENSITIVE Sensitive     CIPROFLOXACIN <=0.25 SENSITIVE Sensitive     GENTAMICIN <=1 SENSITIVE Sensitive     IMIPENEM <=0.25 SENSITIVE Sensitive     NITROFURANTOIN <=16 SENSITIVE Sensitive     TRIMETH/SULFA <=20 SENSITIVE Sensitive     AMPICILLIN/SULBACTAM <=2 SENSITIVE Sensitive     PIP/TAZO <=4 SENSITIVE Sensitive     Extended ESBL NEGATIVE Sensitive     * >=100,000 COLONIES/mL ESCHERICHIA COLI  SARS Coronavirus 2 (CEPHEID - Performed in [facility] hospital lab), Hosp Order     Status: None   Collection Time: 01/08/19  1:20 PM   Specimen: Nasopharyngeal Swab  Result Value Ref Range Status   SARS Coronavirus 2 NEGATIVE NEGATIVE Final    Comment: (NOTE) If result is NEGATIVE SARS-CoV-2 target nucleic acids are NOT DETECTED. The SARS-CoV-2 RNA is generally detectable in upper and lower  respiratory specimens during the acute phase of infection. The lowest  concentration of SARS-CoV-2 viral copies this assay can detect is 250  copies / mL. A negative result does not preclude SARS-CoV-2 infection  and should not be used as the sole basis for treatment or other  patient management decisions.  A negative result may occur with  improper specimen collection / handling, submission of specimen other  than nasopharyngeal swab, presence of viral mutation(s) within the  areas targeted by this assay, and inadequate number of viral copies   (<250 copies / mL). A negative result must be combined with clinical  observations, patient history, and epidemiological information. If result is POSITIVE SARS-CoV-2 target nucleic acids are DETECTED. The SARS-CoV-2 RNA is generally detectable in upper and lower  respiratory specimens dur ing the acute phase of infection.  Positive  results are indicative of active infection with SARS-CoV-2.  Clinical  correlation with patient history and other diagnostic information is  necessary to determine patient infection status.  Positive results do  not rule out bacterial infection or co-infection with other viruses. If result is PRESUMPTIVE POSTIVE SARS-CoV-2 nucleic acids MAY BE PRESENT.   A presumptive positive result was obtained on the submitted specimen  and confirmed on repeat testing.  While 2019 novel coronavirus  (SARS-CoV-2) nucleic acids may be present in the submitted sample  additional confirmatory testing may be necessary for epidemiological  and / or clinical management purposes  to differentiate between  SARS-CoV-2 and other Sarbecovirus currently known to infect humans.  If clinically indicated additional testing with an alternate test  methodology [phone]) is advised. The SARS-CoV-2 RNA is generally  detectable in upper and lower respiratory sp ecimens during the acute  phase of infection. The expected result is Negative. Fact Sheet for Patients:  StrictlyIdeas.no Fact Sheet for Healthcare Providers: BankingDealers.co.za This test is not yet approved or cleared by the [place] FDA and has been authorized for detection and/or diagnosis of SARS-CoV-2 by FDA under an Emergency Use Authorization (EUA).  This EUA will remain in effect (meaning this test can be used) for the duration of the COVID-19 declaration under Section 564(b)(1) of the Act, 21 U.S.C. section 360bbb-3(b)(1), unless the authorization is terminated  or revoked sooner. Performed at [facility], [place] [address] [place], [place] [address]      Time coordinating discharge: Over 30 minutes  SIGNED:   Desiree Hane, MD  [facility] 01/10/2019, 9:22 AM Pager   If 7PM-7AM, please contact night-coverage [email or web address] Password [email or web address]

## 2019-01-10 NOTE — Plan of Care (Signed)

## 2019-01-10 NOTE — TOC Transition Note (Addendum)
Transition of Care [facility]) - CM/SW Discharge Note   Patient Details  Name: Lori Ellis MRN: [id number] Date of Birth: 1921-05-29  [facility]) CM/SW Contact:  Alberteen Sam, LCSW Phone Number: 01/10/2019, 9:38 AM   Clinical Narrative:     Patient will DC to: [facility] date: 01/10/2019 Family notified: Fritz Pickerel Transport [id number]  Per MD patient ready for DC to [facility] . RN, patient, patient's family, and facility notified of DC. Discharge Summary sent to facility. RN given number for report  [phone] Room 123B. DC packet on chart. Ambulance transport requested for patient for 12:00 pm. CSW signing off.  [place], [place]    Final next level of care: Skilled Nursing Facility Barriers to Discharge: No Barriers Identified   Patient Goals and CMS Choice Patient states their goals for this hospitalization and ongoing recovery are:: to get back home CMS Medicare.gov Compare Post Acute Care list provided to:: Patient Represenative (must comment)(son Fritz Pickerel) Choice offered to / list presented to : Adult Children([name])  Discharge Placement PASRR number recieved: 01/03/19            Patient chooses bed at: [facility] Patient to be transferred to facility by: [facility] Name of family member notified: Fritz Pickerel (son) Patient and family notified of of transfer: 01/10/19  Discharge Plan and Services   Discharge Planning Services: CM Consult Post Acute Care Choice: [facility]                               Social Determinants of Health (SDOH) Interventions     Readmission Risk Interventions No flowsheet data found.

## 2019-01-16 DIAGNOSIS — M6281 Muscle weakness (generalized): Secondary | ICD-10-CM | POA: Diagnosis not present

## 2019-01-16 DIAGNOSIS — R5381 Other malaise: Secondary | ICD-10-CM | POA: Diagnosis not present

## 2019-01-16 DIAGNOSIS — M25551 Pain in right hip: Secondary | ICD-10-CM | POA: Diagnosis not present

## 2019-01-16 DIAGNOSIS — R2689 Other abnormalities of gait and mobility: Secondary | ICD-10-CM | POA: Diagnosis not present

## 2019-01-24 DIAGNOSIS — F0391 Unspecified dementia with behavioral disturbance: Secondary | ICD-10-CM | POA: Diagnosis not present

## 2019-01-24 DIAGNOSIS — F05 Delirium due to known physiological condition: Secondary | ICD-10-CM | POA: Diagnosis not present

## 2019-01-24 DIAGNOSIS — I10 Essential (primary) hypertension: Secondary | ICD-10-CM | POA: Diagnosis not present

## 2019-01-24 DIAGNOSIS — S72144D Nondisplaced intertrochanteric fracture of right femur, subsequent encounter for closed fracture with routine healing: Secondary | ICD-10-CM | POA: Diagnosis not present

## 2019-01-26 NOTE — Op Note (Signed)
01/02/2019  4:04 PM  PATIENT:  Lori Ellis  85 y.o. female  PRE-OPERATIVE DIAGNOSIS:  RIGHT FOUR PART INTERTROCHANTERIC FRACTURE  POST-OPERATIVE DIAGNOSIS:  RIGHT FOUR PART INTERTROCHANTERIC FRACTURE  PROCEDURE:  Intramedullary nailing of the right hip using a Biomet Affixus nail.  SURGEON:  Astrid Divine. Marcelino Scot, M.D.  ASSISTANT:  Ainsley Spinner, PA-C.  ANESTHESIA:  General.  COMPLICATIONS:  None.  ESTIMATED BLOOD LOSS:  Less than 150 mL.  DISPOSITION:  To PACU.  CONDITION:  Stable.  BRIEF SUMMARY AND INDICATION OF PROCEDURE:  [name] is a 85 y.o. year- old with multiple medical problems.  I discussed with the patient's son the risks and benefits of surgical treatment including the potential for malunion, nonunion, symptomatic hardware, heart attack, stroke, neurovascular injury, bleeding, and others.  After full discussion, consent was provided to proceed.  BRIEF SUMMARY OF PROCEDURE:  The patient was taken to the operating room where general anesthesia was induced.  She was positioned supine on the Hana fracture table.  A closed reduction maneuver was performed of the fractured proximal femur and this was confirmed on both AP and lateral xray views. A thorough scrub and wash with chlorhexidine and then Betadine scrub and paint was performed.  After sterile drapes and time-out, a long instrument was used to identify the appropriate starting position under C-arm on both AP and lateral images.  A 3 cm incision was made proximal to the greater trochanter.  The curved cannulated awl was inserted just medial to the tip of the lateral trochanter and then the starting guidewire advanced into the proximal femur.  This was checked on AP and lateral views.  The starting reamer was engaged with the soft tissue protected by a sleeve.  The curved ball-tipped guidewire was then inserted, making sure it was just posterior as possible in the distal femur and across the fracture site,  which stayed in a reduced position.  It was sequentially reamed up to 13 and an 11 mm nail inserted to the appropriate depth.  The guidewire for the lag screw was then inserted with the appropriate anteversion to make sure it was in a center-center position.  This was measured and the lag screw placed with excellent purchase and position checked on both views.  The antirotation screw was then engaged within the groove of the lag screw, which was allowed to telescope.  Traction was released and compression achieved with the screw.  This was followed by placement of one distal locking screw using perfect circle technique.  This was confirmed on AP and lateral images. Wounds were irrigated thoroughly, closed in a standard layered fashion. Sterile gently compressive dressings were applied.  Ainsley Spinner, PA-C, assisted throughout.  The patient was awakened from anesthesia and transported to the PACU in stable condition.  PROGNOSIS:  The patient will be weightbearing as tolerated with physical therapy beginning DVT prophylaxis as soon as deemed stable by the Primary Care Service.  She has no range of motion precautions.  We will continue to follow through at the hospital.  Anticipate follow up in the office in 2 weeks for removal of sutures and further evaluation.     Astrid Divine. Marcelino Scot, M.D.

## 2019-01-28 DIAGNOSIS — R627 Adult failure to thrive: Secondary | ICD-10-CM | POA: Diagnosis not present

## 2019-01-28 DIAGNOSIS — F05 Delirium due to known physiological condition: Secondary | ICD-10-CM | POA: Diagnosis not present

## 2019-01-28 DIAGNOSIS — F0391 Unspecified dementia with behavioral disturbance: Secondary | ICD-10-CM | POA: Diagnosis not present

## 2019-01-28 DIAGNOSIS — M6281 Muscle weakness (generalized): Secondary | ICD-10-CM | POA: Diagnosis not present

## 2019-01-29 DIAGNOSIS — M6281 Muscle weakness (generalized): Secondary | ICD-10-CM | POA: Diagnosis not present

## 2019-01-29 DIAGNOSIS — R451 Restlessness and agitation: Secondary | ICD-10-CM | POA: Diagnosis not present

## 2019-01-29 DIAGNOSIS — F0391 Unspecified dementia with behavioral disturbance: Secondary | ICD-10-CM | POA: Diagnosis not present

## 2019-01-29 DIAGNOSIS — S72144D Nondisplaced intertrochanteric fracture of right femur, subsequent encounter for closed fracture with routine healing: Secondary | ICD-10-CM | POA: Diagnosis not present

## 2019-01-30 DIAGNOSIS — M6281 Muscle weakness (generalized): Secondary | ICD-10-CM | POA: Diagnosis not present

## 2019-01-30 DIAGNOSIS — F0391 Unspecified dementia with behavioral disturbance: Secondary | ICD-10-CM | POA: Diagnosis not present

## 2019-01-30 DIAGNOSIS — F05 Delirium due to known physiological condition: Secondary | ICD-10-CM | POA: Diagnosis not present

## 2019-01-30 DIAGNOSIS — R451 Restlessness and agitation: Secondary | ICD-10-CM | POA: Diagnosis not present

## 2019-02-05 DIAGNOSIS — F0391 Unspecified dementia with behavioral disturbance: Secondary | ICD-10-CM | POA: Diagnosis not present

## 2019-02-05 DIAGNOSIS — I10 Essential (primary) hypertension: Secondary | ICD-10-CM | POA: Diagnosis not present

## 2019-02-05 DIAGNOSIS — E039 Hypothyroidism, unspecified: Secondary | ICD-10-CM | POA: Diagnosis not present

## 2019-02-05 DIAGNOSIS — F05 Delirium due to known physiological condition: Secondary | ICD-10-CM | POA: Diagnosis not present

## 2019-02-10 DIAGNOSIS — I4892 Unspecified atrial flutter: Secondary | ICD-10-CM | POA: Diagnosis not present

## 2019-02-10 DIAGNOSIS — S0101XD Laceration without foreign body of scalp, subsequent encounter: Secondary | ICD-10-CM | POA: Diagnosis not present

## 2019-02-10 DIAGNOSIS — E349 Endocrine disorder, unspecified: Secondary | ICD-10-CM | POA: Diagnosis not present

## 2019-02-10 DIAGNOSIS — F22 Delusional disorders: Secondary | ICD-10-CM | POA: Diagnosis not present

## 2019-02-10 DIAGNOSIS — M5137 Other intervertebral disc degeneration, lumbosacral region: Secondary | ICD-10-CM | POA: Diagnosis not present

## 2019-02-10 DIAGNOSIS — E1122 Type 2 diabetes mellitus with diabetic chronic kidney disease: Secondary | ICD-10-CM | POA: Diagnosis not present

## 2019-02-10 DIAGNOSIS — D7289 Other specified disorders of white blood cells: Secondary | ICD-10-CM | POA: Diagnosis not present

## 2019-02-10 DIAGNOSIS — M48061 Spinal stenosis, lumbar region without neurogenic claudication: Secondary | ICD-10-CM | POA: Diagnosis not present

## 2019-02-10 DIAGNOSIS — G9340 Encephalopathy, unspecified: Secondary | ICD-10-CM | POA: Diagnosis not present

## 2019-02-10 DIAGNOSIS — R410 Disorientation, unspecified: Secondary | ICD-10-CM | POA: Diagnosis not present

## 2019-02-10 DIAGNOSIS — Z9181 History of falling: Secondary | ICD-10-CM | POA: Diagnosis not present

## 2019-02-10 DIAGNOSIS — Z96641 Presence of right artificial hip joint: Secondary | ICD-10-CM | POA: Diagnosis not present

## 2019-02-10 DIAGNOSIS — D631 Anemia in chronic kidney disease: Secondary | ICD-10-CM | POA: Diagnosis not present

## 2019-02-10 DIAGNOSIS — N183 Chronic kidney disease, stage 3 (moderate): Secondary | ICD-10-CM | POA: Diagnosis not present

## 2019-02-10 DIAGNOSIS — I129 Hypertensive chronic kidney disease with stage 1 through stage 4 chronic kidney disease, or unspecified chronic kidney disease: Secondary | ICD-10-CM | POA: Diagnosis not present

## 2019-02-10 DIAGNOSIS — S72144D Nondisplaced intertrochanteric fracture of right femur, subsequent encounter for closed fracture with routine healing: Secondary | ICD-10-CM | POA: Diagnosis not present

## 2019-02-10 DIAGNOSIS — E039 Hypothyroidism, unspecified: Secondary | ICD-10-CM | POA: Diagnosis not present

## 2019-02-10 DIAGNOSIS — H3530 Unspecified macular degeneration: Secondary | ICD-10-CM | POA: Diagnosis not present

## 2019-02-10 DIAGNOSIS — N179 Acute kidney failure, unspecified: Secondary | ICD-10-CM | POA: Diagnosis not present

## 2019-02-10 DIAGNOSIS — Z4889 Encounter for other specified surgical aftercare: Secondary | ICD-10-CM | POA: Diagnosis not present

## 2019-02-12 DIAGNOSIS — S72141D Displaced intertrochanteric fracture of right femur, subsequent encounter for closed fracture with routine healing: Secondary | ICD-10-CM | POA: Diagnosis not present

## 2019-02-18 DIAGNOSIS — I129 Hypertensive chronic kidney disease with stage 1 through stage 4 chronic kidney disease, or unspecified chronic kidney disease: Secondary | ICD-10-CM | POA: Diagnosis not present

## 2019-02-18 DIAGNOSIS — N183 Chronic kidney disease, stage 3 (moderate): Secondary | ICD-10-CM | POA: Diagnosis not present

## 2019-02-18 DIAGNOSIS — Z09 Encounter for follow-up examination after completed treatment for conditions other than malignant neoplasm: Secondary | ICD-10-CM | POA: Diagnosis not present

## 2019-02-18 DIAGNOSIS — E1159 Type 2 diabetes mellitus with other circulatory complications: Secondary | ICD-10-CM | POA: Diagnosis not present

## 2019-02-18 DIAGNOSIS — S72009S Fracture of unspecified part of neck of unspecified femur, sequela: Secondary | ICD-10-CM | POA: Diagnosis not present

## 2019-02-18 DIAGNOSIS — E559 Vitamin D deficiency, unspecified: Secondary | ICD-10-CM | POA: Diagnosis not present

## 2019-02-18 DIAGNOSIS — R2681 Unsteadiness on feet: Secondary | ICD-10-CM | POA: Diagnosis not present

## 2019-02-18 DIAGNOSIS — E039 Hypothyroidism, unspecified: Secondary | ICD-10-CM | POA: Diagnosis not present

## 2019-02-18 DIAGNOSIS — E1122 Type 2 diabetes mellitus with diabetic chronic kidney disease: Secondary | ICD-10-CM | POA: Diagnosis not present

## 2019-02-19 DIAGNOSIS — Z4889 Encounter for other specified surgical aftercare: Secondary | ICD-10-CM | POA: Diagnosis not present

## 2019-02-19 DIAGNOSIS — Z96641 Presence of right artificial hip joint: Secondary | ICD-10-CM | POA: Diagnosis not present

## 2019-02-19 DIAGNOSIS — E039 Hypothyroidism, unspecified: Secondary | ICD-10-CM | POA: Diagnosis not present

## 2019-02-19 DIAGNOSIS — N183 Chronic kidney disease, stage 3 (moderate): Secondary | ICD-10-CM | POA: Diagnosis not present

## 2019-02-19 DIAGNOSIS — E1122 Type 2 diabetes mellitus with diabetic chronic kidney disease: Secondary | ICD-10-CM | POA: Diagnosis not present

## 2019-02-19 DIAGNOSIS — D631 Anemia in chronic kidney disease: Secondary | ICD-10-CM | POA: Diagnosis not present

## 2019-02-19 DIAGNOSIS — M48061 Spinal stenosis, lumbar region without neurogenic claudication: Secondary | ICD-10-CM | POA: Diagnosis not present

## 2019-02-19 DIAGNOSIS — F22 Delusional disorders: Secondary | ICD-10-CM | POA: Diagnosis not present

## 2019-02-19 DIAGNOSIS — M5137 Other intervertebral disc degeneration, lumbosacral region: Secondary | ICD-10-CM | POA: Diagnosis not present

## 2019-02-19 DIAGNOSIS — H3530 Unspecified macular degeneration: Secondary | ICD-10-CM | POA: Diagnosis not present

## 2019-02-19 DIAGNOSIS — S72144D Nondisplaced intertrochanteric fracture of right femur, subsequent encounter for closed fracture with routine healing: Secondary | ICD-10-CM | POA: Diagnosis not present

## 2019-02-19 DIAGNOSIS — E349 Endocrine disorder, unspecified: Secondary | ICD-10-CM | POA: Diagnosis not present

## 2019-02-19 DIAGNOSIS — Z9181 History of falling: Secondary | ICD-10-CM | POA: Diagnosis not present

## 2019-02-19 DIAGNOSIS — S0101XD Laceration without foreign body of scalp, subsequent encounter: Secondary | ICD-10-CM | POA: Diagnosis not present

## 2019-02-19 DIAGNOSIS — I4892 Unspecified atrial flutter: Secondary | ICD-10-CM | POA: Diagnosis not present

## 2019-02-19 DIAGNOSIS — R410 Disorientation, unspecified: Secondary | ICD-10-CM | POA: Diagnosis not present

## 2019-02-19 DIAGNOSIS — D7289 Other specified disorders of white blood cells: Secondary | ICD-10-CM | POA: Diagnosis not present

## 2019-02-19 DIAGNOSIS — G9340 Encephalopathy, unspecified: Secondary | ICD-10-CM | POA: Diagnosis not present

## 2019-02-19 DIAGNOSIS — I129 Hypertensive chronic kidney disease with stage 1 through stage 4 chronic kidney disease, or unspecified chronic kidney disease: Secondary | ICD-10-CM | POA: Diagnosis not present

## 2019-02-19 DIAGNOSIS — N179 Acute kidney failure, unspecified: Secondary | ICD-10-CM | POA: Diagnosis not present

## 2019-03-11 DIAGNOSIS — H3530 Unspecified macular degeneration: Secondary | ICD-10-CM | POA: Diagnosis not present

## 2019-03-11 DIAGNOSIS — E1122 Type 2 diabetes mellitus with diabetic chronic kidney disease: Secondary | ICD-10-CM | POA: Diagnosis not present

## 2019-03-11 DIAGNOSIS — G9340 Encephalopathy, unspecified: Secondary | ICD-10-CM | POA: Diagnosis not present

## 2019-03-11 DIAGNOSIS — D631 Anemia in chronic kidney disease: Secondary | ICD-10-CM | POA: Diagnosis not present

## 2019-03-11 DIAGNOSIS — Z96641 Presence of right artificial hip joint: Secondary | ICD-10-CM | POA: Diagnosis not present

## 2019-03-11 DIAGNOSIS — Z4889 Encounter for other specified surgical aftercare: Secondary | ICD-10-CM | POA: Diagnosis not present

## 2019-03-11 DIAGNOSIS — N183 Chronic kidney disease, stage 3 (moderate): Secondary | ICD-10-CM | POA: Diagnosis not present

## 2019-03-11 DIAGNOSIS — Z9181 History of falling: Secondary | ICD-10-CM | POA: Diagnosis not present

## 2019-03-11 DIAGNOSIS — I129 Hypertensive chronic kidney disease with stage 1 through stage 4 chronic kidney disease, or unspecified chronic kidney disease: Secondary | ICD-10-CM | POA: Diagnosis not present

## 2019-03-11 DIAGNOSIS — M5137 Other intervertebral disc degeneration, lumbosacral region: Secondary | ICD-10-CM | POA: Diagnosis not present

## 2019-03-11 DIAGNOSIS — S0101XD Laceration without foreign body of scalp, subsequent encounter: Secondary | ICD-10-CM | POA: Diagnosis not present

## 2019-03-11 DIAGNOSIS — E039 Hypothyroidism, unspecified: Secondary | ICD-10-CM | POA: Diagnosis not present

## 2019-03-11 DIAGNOSIS — F22 Delusional disorders: Secondary | ICD-10-CM | POA: Diagnosis not present

## 2019-03-11 DIAGNOSIS — E349 Endocrine disorder, unspecified: Secondary | ICD-10-CM | POA: Diagnosis not present

## 2019-03-11 DIAGNOSIS — R410 Disorientation, unspecified: Secondary | ICD-10-CM | POA: Diagnosis not present

## 2019-03-11 DIAGNOSIS — S72144D Nondisplaced intertrochanteric fracture of right femur, subsequent encounter for closed fracture with routine healing: Secondary | ICD-10-CM | POA: Diagnosis not present

## 2019-03-11 DIAGNOSIS — I4892 Unspecified atrial flutter: Secondary | ICD-10-CM | POA: Diagnosis not present

## 2019-03-11 DIAGNOSIS — M48061 Spinal stenosis, lumbar region without neurogenic claudication: Secondary | ICD-10-CM | POA: Diagnosis not present

## 2019-03-11 DIAGNOSIS — D7289 Other specified disorders of white blood cells: Secondary | ICD-10-CM | POA: Diagnosis not present

## 2019-03-11 DIAGNOSIS — N179 Acute kidney failure, unspecified: Secondary | ICD-10-CM | POA: Diagnosis not present

## 2019-03-25 DIAGNOSIS — D631 Anemia in chronic kidney disease: Secondary | ICD-10-CM | POA: Diagnosis not present

## 2019-03-25 DIAGNOSIS — Z96641 Presence of right artificial hip joint: Secondary | ICD-10-CM | POA: Diagnosis not present

## 2019-03-25 DIAGNOSIS — G9340 Encephalopathy, unspecified: Secondary | ICD-10-CM | POA: Diagnosis not present

## 2019-03-25 DIAGNOSIS — Z9181 History of falling: Secondary | ICD-10-CM | POA: Diagnosis not present

## 2019-03-25 DIAGNOSIS — I129 Hypertensive chronic kidney disease with stage 1 through stage 4 chronic kidney disease, or unspecified chronic kidney disease: Secondary | ICD-10-CM | POA: Diagnosis not present

## 2019-03-25 DIAGNOSIS — I4892 Unspecified atrial flutter: Secondary | ICD-10-CM | POA: Diagnosis not present

## 2019-03-25 DIAGNOSIS — D7289 Other specified disorders of white blood cells: Secondary | ICD-10-CM | POA: Diagnosis not present

## 2019-03-25 DIAGNOSIS — N183 Chronic kidney disease, stage 3 unspecified: Secondary | ICD-10-CM | POA: Diagnosis not present

## 2019-03-25 DIAGNOSIS — S0101XD Laceration without foreign body of scalp, subsequent encounter: Secondary | ICD-10-CM | POA: Diagnosis not present

## 2019-03-25 DIAGNOSIS — F22 Delusional disorders: Secondary | ICD-10-CM | POA: Diagnosis not present

## 2019-03-25 DIAGNOSIS — H3530 Unspecified macular degeneration: Secondary | ICD-10-CM | POA: Diagnosis not present

## 2019-03-25 DIAGNOSIS — E1122 Type 2 diabetes mellitus with diabetic chronic kidney disease: Secondary | ICD-10-CM | POA: Diagnosis not present

## 2019-03-25 DIAGNOSIS — M5137 Other intervertebral disc degeneration, lumbosacral region: Secondary | ICD-10-CM | POA: Diagnosis not present

## 2019-03-25 DIAGNOSIS — E349 Endocrine disorder, unspecified: Secondary | ICD-10-CM | POA: Diagnosis not present

## 2019-03-25 DIAGNOSIS — S72144D Nondisplaced intertrochanteric fracture of right femur, subsequent encounter for closed fracture with routine healing: Secondary | ICD-10-CM | POA: Diagnosis not present

## 2019-03-25 DIAGNOSIS — M48061 Spinal stenosis, lumbar region without neurogenic claudication: Secondary | ICD-10-CM | POA: Diagnosis not present

## 2019-03-25 DIAGNOSIS — Z4889 Encounter for other specified surgical aftercare: Secondary | ICD-10-CM | POA: Diagnosis not present

## 2019-03-25 DIAGNOSIS — N179 Acute kidney failure, unspecified: Secondary | ICD-10-CM | POA: Diagnosis not present

## 2019-03-25 DIAGNOSIS — R410 Disorientation, unspecified: Secondary | ICD-10-CM | POA: Diagnosis not present

## 2019-03-25 DIAGNOSIS — E039 Hypothyroidism, unspecified: Secondary | ICD-10-CM | POA: Diagnosis not present

## 2019-03-26 DIAGNOSIS — S72141D Displaced intertrochanteric fracture of right femur, subsequent encounter for closed fracture with routine healing: Secondary | ICD-10-CM | POA: Diagnosis not present

## 2019-05-20 DIAGNOSIS — E559 Vitamin D deficiency, unspecified: Secondary | ICD-10-CM | POA: Diagnosis not present

## 2019-05-20 DIAGNOSIS — I129 Hypertensive chronic kidney disease with stage 1 through stage 4 chronic kidney disease, or unspecified chronic kidney disease: Secondary | ICD-10-CM | POA: Diagnosis not present

## 2019-05-20 DIAGNOSIS — E1122 Type 2 diabetes mellitus with diabetic chronic kidney disease: Secondary | ICD-10-CM | POA: Diagnosis not present

## 2019-05-20 DIAGNOSIS — E1159 Type 2 diabetes mellitus with other circulatory complications: Secondary | ICD-10-CM | POA: Diagnosis not present

## 2019-05-20 DIAGNOSIS — I10 Essential (primary) hypertension: Secondary | ICD-10-CM | POA: Diagnosis not present

## 2019-05-20 DIAGNOSIS — E039 Hypothyroidism, unspecified: Secondary | ICD-10-CM | POA: Diagnosis not present

## 2019-05-20 DIAGNOSIS — N183 Chronic kidney disease, stage 3 unspecified: Secondary | ICD-10-CM | POA: Diagnosis not present

## 2019-05-20 DIAGNOSIS — M5441 Lumbago with sciatica, right side: Secondary | ICD-10-CM | POA: Diagnosis not present

## 2019-05-20 DIAGNOSIS — E785 Hyperlipidemia, unspecified: Secondary | ICD-10-CM | POA: Diagnosis not present

## 2019-05-20 DIAGNOSIS — Z23 Encounter for immunization: Secondary | ICD-10-CM | POA: Diagnosis not present

## 2019-05-20 DIAGNOSIS — G8929 Other chronic pain: Secondary | ICD-10-CM | POA: Diagnosis not present

## 2019-05-20 DIAGNOSIS — E1169 Type 2 diabetes mellitus with other specified complication: Secondary | ICD-10-CM | POA: Diagnosis not present

## 2019-05-20 DIAGNOSIS — E538 Deficiency of other specified B group vitamins: Secondary | ICD-10-CM | POA: Diagnosis not present

## 2019-05-20 DIAGNOSIS — M5442 Lumbago with sciatica, left side: Secondary | ICD-10-CM | POA: Diagnosis not present

## 2019-06-23 DIAGNOSIS — M5136 Other intervertebral disc degeneration, lumbar region: Secondary | ICD-10-CM | POA: Diagnosis not present

## 2019-07-11 DIAGNOSIS — M5117 Intervertebral disc disorders with radiculopathy, lumbosacral region: Secondary | ICD-10-CM | POA: Diagnosis not present

## 2019-07-11 DIAGNOSIS — M5416 Radiculopathy, lumbar region: Secondary | ICD-10-CM | POA: Diagnosis not present

## 2019-07-11 DIAGNOSIS — M5137 Other intervertebral disc degeneration, lumbosacral region: Secondary | ICD-10-CM | POA: Diagnosis not present

## 2019-07-25 DIAGNOSIS — M545 Low back pain: Secondary | ICD-10-CM | POA: Diagnosis not present

## 2019-07-25 DIAGNOSIS — M5136 Other intervertebral disc degeneration, lumbar region: Secondary | ICD-10-CM | POA: Diagnosis not present

## 2019-12-19 DIAGNOSIS — R419 Unspecified symptoms and signs involving cognitive functions and awareness: Secondary | ICD-10-CM | POA: Diagnosis not present

## 2019-12-19 DIAGNOSIS — E1122 Type 2 diabetes mellitus with diabetic chronic kidney disease: Secondary | ICD-10-CM | POA: Diagnosis not present

## 2019-12-19 DIAGNOSIS — G8929 Other chronic pain: Secondary | ICD-10-CM | POA: Diagnosis not present

## 2019-12-19 DIAGNOSIS — I152 Hypertension secondary to endocrine disorders: Secondary | ICD-10-CM | POA: Diagnosis not present

## 2019-12-19 DIAGNOSIS — Z7189 Other specified counseling: Secondary | ICD-10-CM | POA: Diagnosis not present

## 2019-12-19 DIAGNOSIS — M5441 Lumbago with sciatica, right side: Secondary | ICD-10-CM | POA: Diagnosis not present

## 2019-12-19 DIAGNOSIS — E039 Hypothyroidism, unspecified: Secondary | ICD-10-CM | POA: Diagnosis not present

## 2019-12-19 DIAGNOSIS — E1159 Type 2 diabetes mellitus with other circulatory complications: Secondary | ICD-10-CM | POA: Diagnosis not present

## 2019-12-19 DIAGNOSIS — N183 Chronic kidney disease, stage 3 unspecified: Secondary | ICD-10-CM | POA: Diagnosis not present

## 2019-12-19 DIAGNOSIS — E785 Hyperlipidemia, unspecified: Secondary | ICD-10-CM | POA: Diagnosis not present

## 2019-12-19 DIAGNOSIS — M5442 Lumbago with sciatica, left side: Secondary | ICD-10-CM | POA: Diagnosis not present

## 2019-12-19 DIAGNOSIS — E1169 Type 2 diabetes mellitus with other specified complication: Secondary | ICD-10-CM | POA: Diagnosis not present

## 2020-01-30 DIAGNOSIS — E1159 Type 2 diabetes mellitus with other circulatory complications: Secondary | ICD-10-CM | POA: Diagnosis not present

## 2020-01-30 DIAGNOSIS — E785 Hyperlipidemia, unspecified: Secondary | ICD-10-CM | POA: Diagnosis not present

## 2020-01-30 DIAGNOSIS — E1122 Type 2 diabetes mellitus with diabetic chronic kidney disease: Secondary | ICD-10-CM | POA: Diagnosis not present

## 2020-01-30 DIAGNOSIS — N183 Chronic kidney disease, stage 3 unspecified: Secondary | ICD-10-CM | POA: Diagnosis not present

## 2020-01-30 DIAGNOSIS — I152 Hypertension secondary to endocrine disorders: Secondary | ICD-10-CM | POA: Diagnosis not present

## 2020-01-30 DIAGNOSIS — M48061 Spinal stenosis, lumbar region without neurogenic claudication: Secondary | ICD-10-CM | POA: Diagnosis not present

## 2020-01-30 DIAGNOSIS — E1169 Type 2 diabetes mellitus with other specified complication: Secondary | ICD-10-CM | POA: Diagnosis not present

## 2020-01-30 DIAGNOSIS — E039 Hypothyroidism, unspecified: Secondary | ICD-10-CM | POA: Diagnosis not present

## 2020-02-06 ENCOUNTER — Emergency Department (HOSPITAL_BASED_OUTPATIENT_CLINIC_OR_DEPARTMENT_OTHER): Payer: PPO

## 2020-02-06 ENCOUNTER — Emergency Department (HOSPITAL_BASED_OUTPATIENT_CLINIC_OR_DEPARTMENT_OTHER)
Admission: EM | Admit: 2020-02-06 | Discharge: 2020-02-06 | Disposition: A | Discharge status: Home / Self Care | Payer: PPO | Attending: Emergency Medicine | Admitting: Emergency Medicine

## 2020-02-06 ENCOUNTER — Other Ambulatory Visit: Payer: Self-pay

## 2020-02-06 ENCOUNTER — Encounter (HOSPITAL_BASED_OUTPATIENT_CLINIC_OR_DEPARTMENT_OTHER): Payer: Self-pay | Admitting: Emergency Medicine

## 2020-02-06 DIAGNOSIS — Z79899 Other long term (current) drug therapy: Secondary | ICD-10-CM | POA: Insufficient documentation

## 2020-02-06 DIAGNOSIS — M545 Low back pain, unspecified: Secondary | ICD-10-CM

## 2020-02-06 DIAGNOSIS — Z79890 Hormone replacement therapy (postmenopausal): Secondary | ICD-10-CM | POA: Diagnosis not present

## 2020-02-06 DIAGNOSIS — N183 Chronic kidney disease, stage 3 unspecified: Secondary | ICD-10-CM | POA: Diagnosis not present

## 2020-02-06 DIAGNOSIS — M160 Bilateral primary osteoarthritis of hip: Secondary | ICD-10-CM | POA: Diagnosis not present

## 2020-02-06 DIAGNOSIS — E119 Type 2 diabetes mellitus without complications: Secondary | ICD-10-CM | POA: Insufficient documentation

## 2020-02-06 DIAGNOSIS — I129 Hypertensive chronic kidney disease with stage 1 through stage 4 chronic kidney disease, or unspecified chronic kidney disease: Secondary | ICD-10-CM | POA: Diagnosis not present

## 2020-02-06 DIAGNOSIS — M5489 Other dorsalgia: Secondary | ICD-10-CM | POA: Diagnosis not present

## 2020-02-06 DIAGNOSIS — M25552 Pain in left hip: Secondary | ICD-10-CM | POA: Diagnosis not present

## 2020-02-06 DIAGNOSIS — M25551 Pain in right hip: Secondary | ICD-10-CM | POA: Insufficient documentation

## 2020-02-06 DIAGNOSIS — E039 Hypothyroidism, unspecified: Secondary | ICD-10-CM | POA: Diagnosis not present

## 2020-02-06 DIAGNOSIS — S3993XA Unspecified injury of pelvis, initial encounter: Secondary | ICD-10-CM | POA: Diagnosis not present

## 2020-02-06 DIAGNOSIS — G8929 Other chronic pain: Secondary | ICD-10-CM | POA: Insufficient documentation

## 2020-02-06 DIAGNOSIS — R52 Pain, unspecified: Secondary | ICD-10-CM | POA: Diagnosis not present

## 2020-02-06 DIAGNOSIS — M549 Dorsalgia, unspecified: Secondary | ICD-10-CM | POA: Diagnosis not present

## 2020-02-06 LAB — URINALYSIS, ROUTINE W REFLEX MICROSCOPIC
Bilirubin Urine: NEGATIVE
Glucose, UA: NEGATIVE mg/dL
Hgb urine dipstick: NEGATIVE
Ketones, ur: NEGATIVE mg/dL
Nitrite: NEGATIVE
Protein, ur: NEGATIVE mg/dL
Specific Gravity, Urine: 1.015 (ref 1.005–1.030)
pH: 7 (ref 5.0–8.0)

## 2020-02-06 LAB — URINALYSIS, MICROSCOPIC (REFLEX)

## 2020-02-06 MED ORDER — OXYCODONE-ACETAMINOPHEN 5-325 MG PO TABS
1.0000 | ORAL_TABLET | Freq: Once | ORAL | Status: AC
  Administered 2020-02-06: 1 via ORAL
  Filled 2020-02-06: qty 1

## 2020-02-06 NOTE — ED Notes (Signed)
Pt not ready for imaging, return in a few min

## 2020-02-06 NOTE — ED Provider Notes (Signed)
[facility] EMERGENCY DEPARTMENT Provider Note   CSN: [id number] Arrival date & time: 02/06/20  1945     History Chief Complaint  Patient presents with  . Back Pain    Lori Ellis is a 86 y.o. female past mostly degenerative disc disease, diabetes, and hypertension who presents for evaluation of back pain that has been ongoing for last 3 to 4 days.  She reports she has history of chronic back pain. This feels similar. She states that the pain radiates to her hips. The pain is worse when she tries to get up and out of from bed.  She ambulates with the assistance of a walker and states she still been able to ambulate with the assist of walker.  She is still been able to move her legs by difficulty.  Denies any abdominal pain, nausea/vomiting.  Son is at bedside.  He reports that patient has history of chronic back pain and takes tramadol prescribed by her primary care doctor.  He reports that the next week or so, patient will move from her house to an assisted living facility.  He reports that patient has been moving things and has been doing a lot of extra work to get ready for the move which he feels like is probably exacerbated her chronic back pain.  The history is provided by the patient.       Past Medical History:  Diagnosis Date  . Degenerative disc disease, lumbar   . Diabetes mellitus without complication (HCC)   . Hypertension     Patient Active Problem List   Diagnosis Date Noted  . Lethargy 01/08/2019  . Closed intertrochanteric fracture, right, initial encounter (HCC) 01/01/2019  . Atrial flutter by electrocardiogram (HCC) 01/01/2019  . Benign essential HTN   . Spinal stenosis, lumbar 06/12/2017  . Chronic bilateral low back pain with bilateral sciatica 03/07/2016  . Spondylolisthesis of lumbar region 03/07/2016  . CKD (chronic kidney disease) stage 3, GFR 30-59 ml/min 03/12/2014  . Hypothyroidism 09/02/2013  . Type II diabetes mellitus with renal  manifestations (HCC) 09/02/2013  . DDD (degenerative disc disease), lumbosacral 09/01/2013    Past Surgical History:  Procedure Laterality Date  . BLADDER SURGERY    . CHOLECYSTECTOMY    . INTRAMEDULLARY (IM) NAIL INTERTROCHANTERIC Right 01/02/2019   Procedure: INTRAMEDULLARY (IM) NAIL INTERTROCHANTRIC right hip;  Surgeon: Myrene Galas, MD;  Location: [facility] OR;  Service: Orthopedics;  Laterality: Right;     OB History   No obstetric history on file.     No family history on file.  Social History   Tobacco Use  . Smoking status: Never Smoker  . Smokeless tobacco: Never Used  Substance Use Topics  . Alcohol use: No  . Drug use: Not on file    Home Medications Prior to Admission medications   Medication Sig Start Date End Date Taking? Authorizing Provider  acetaminophen (TYLENOL) 325 MG tablet Take 2 tablets (650 mg total) by mouth every 6 (six) hours as needed for mild pain. Patient not taking: Reported on 01/01/2019 06/21/17   Burna Cash, MD  amLODipine (NORVASC) 2.5 MG tablet Take 1 tablet (2.5 mg total) by mouth at bedtime. 01/10/19   Laverna Peace, MD  cholecalciferol (VITAMIN D) 25 MCG tablet Take 2 tablets (2,000 Units total) by mouth 2 (two) times daily. 01/03/19   Montez Morita, PA-C  enoxaparin (LOVENOX) 30 MG/0.3ML injection Inject 0.3 mLs (30 mg total) into the skin daily. 01/04/19 02/03/19  Montez Morita, PA-C  gabapentin (NEURONTIN) 300 MG capsule Take 1 capsule (300 mg total) by mouth at bedtime. 01/10/19   Laverna PeaceNettey, Shayla D, MD  levothyroxine (SYNTHROID) 75 MCG tablet Take 1 tablet (75 mcg total) by mouth daily before breakfast. 01/10/19   Roberto ScalesNettey, Shayla D, MD  lisinopril (ZESTRIL) 20 MG tablet HOLD until BMP check by doctor 01/10/19   Laverna PeaceNettey, Shayla D, MD  Melatonin 3 MG TABS Take 1 tablet (3 mg total) by mouth at bedtime as needed (sleep). 01/10/19   Laverna PeaceNettey, Shayla D, MD  Multiple Vitamins-Minerals (CENTRUM SILVER 50+WOMEN) TABS Take 1 tablet by mouth daily with  lunch.    [provider]  Multiple Vitamins-Minerals (PRESERVISION AREDS 2) CAPS Take 1 capsule by mouth every evening.    [provider]  propranolol (INDERAL) 20 MG tablet Take 1-2 tablets (20-40 mg total) by mouth See admin instructions. Take 40 mg by mouth in the morning and 20 mg at 4:30 PM 01/10/19   Roberto ScalesNettey, Shayla D, MD  QUEtiapine (SEROQUEL) 25 MG tablet Take 1 tablet (25 mg total) by mouth every evening. 01/10/19   Laverna PeaceNettey, Shayla D, MD  senna (SENOKOT) 8.6 MG TABS tablet Take 1 tablet (8.6 mg total) by mouth 2 (two) times daily. 01/10/19   Laverna PeaceNettey, Shayla D, MD  vitamin C (VITAMIN C) 500 MG tablet Take 1 tablet (500 mg total) by mouth daily. 01/04/19   Montez MoritaPaul, Keith, PA-C    Allergies    Fentanyl, Penicillins, Tetanus toxoid, Adhesive [tape], Advil [ibuprofen], Hydrochlorothiazide, and Naproxen sodium  Review of Systems   Review of Systems  Gastrointestinal: Negative for abdominal pain, nausea and vomiting.  Genitourinary: Negative for dysuria.  Musculoskeletal: Positive for back pain. Negative for neck pain.  Neurological: Negative for weakness and numbness.  All other systems reviewed and are negative.   Physical Exam Updated Vital Signs BP (!) 176/70   Pulse 62   Temp 98.2 F (36.8 C) (Oral)   Resp 18   Ht 5\' 3"  (1.6 m)   Wt 54.4 kg   SpO2 94%   BMI 21.24 kg/m   Physical Exam Vitals and nursing note reviewed.  Constitutional:      Appearance: Normal appearance. She is well-developed.     Comments: Appears uncomfortable but no acute distress.  HENT:     Head: Normocephalic and atraumatic.  Eyes:     General: Lids are normal.     Conjunctiva/sclera: Conjunctivae normal.     Pupils: Pupils are equal, round, and reactive to light.  Neck:     Comments: Full flexion/extension and lateral movement of neck fully intact. No bony midline tenderness. No deformities or crepitus.  Cardiovascular:     Rate and Rhythm: Normal rate and regular rhythm.      Pulses: Normal pulses.     Heart sounds: Normal heart sounds. No murmur heard.  No friction rub. No gallop.   Pulmonary:     Effort: Pulmonary effort is normal.     Breath sounds: Normal breath sounds.  Abdominal:     Palpations: Abdomen is soft. Abdomen is not rigid.     Tenderness: There is no abdominal tenderness. There is no guarding.  Musculoskeletal:        General: Normal range of motion.     Cervical back: Full passive range of motion without pain.       Back:     Comments: No midline T-spine tenderness.  Tenderness palpation of midline lumbar spine at the lower lumbar region that extends to  the bilateral hip bones.  No bony deformity of the hips bilaterally.  Flexion/extension of bilateral lower extremities intact with any difficulty.  Skin:    General: Skin is warm and dry.     Capillary Refill: Capillary refill takes less than 2 seconds.     Comments: No overlying rash.  Neurological:     Mental Status: She is alert and oriented to person, place, and time.     Comments: Follows commands, Moves all extremities  5/5 strength to BUE and BLE  Sensation intact throughout all major nerve distributions  Psychiatric:        Speech: Speech normal.      ED Results / Procedures / Treatments   Labs (all labs ordered are listed, but only abnormal results are displayed) Labs Reviewed  URINALYSIS, ROUTINE W REFLEX MICROSCOPIC - Abnormal; Notable for the following components:      Result Value   Color, Urine STRAW (*)    APPearance CLOUDY (*)    Leukocytes,Ua SMALL (*)    All other components within normal limits  URINALYSIS, MICROSCOPIC (REFLEX) - Abnormal; Notable for the following components:   Bacteria, UA FEW (*)    All other components within normal limits  URINE CULTURE    EKG None  Radiology DG Lumbar Spine Complete  Result Date: 02/06/2020 CLINICAL DATA:  Acute on chronic back pain EXAM: LUMBAR SPINE - COMPLETE 4+ VIEW COMPARISON:  02/09/2016 FINDINGS: Frontal,  bilateral oblique, lateral views of the lumbar spine are obtained. There are 5 non-rib-bearing lumbar type vertebral bodies. Mild grade 1 anterolisthesis of L3 on L4 and L4 on L5. There is progressive spondylosis at L4-5 and L5-S1. There is progressive diffuse facet hypertrophy. No acute fractures. Sacroiliac joints are normal. Postsurgical changes right hip. Extensive atherosclerosis. IMPRESSION: 1. Progressive lumbar spondylosis and facet hypertrophy. No acute fracture. Electronically Signed   By: Sharlet Salina M.D.   On: 02/06/2020 21:29   DG Pelvis 1-2 Views  Result Date: 02/06/2020 CLINICAL DATA:  Acute on chronic back pain EXAM: PELVIS - 1-2 VIEW COMPARISON:  01/02/2019 FINDINGS: Single frontal view of the pelvis demonstrates stable postsurgical changes from prior right hip fracture. Heterotopic ossification surrounds the right hip. No acute displaced fracture. Mild symmetrical bilateral hip osteoarthritis. Sacroiliac joints are normal. IMPRESSION: 1. Osteoarthritis.  No acute fracture. Electronically Signed   By: Sharlet Salina M.D.   On: 02/06/2020 21:30    Procedures Procedures (including critical care time)  Medications Ordered in ED Medications  oxyCODONE-acetaminophen (PERCOCET/ROXICET) 5-325 MG per tablet 1 tablet (1 tablet Oral Given 02/06/20 2047)    ED Course  I have reviewed the triage vital signs and the nursing notes.  Pertinent labs & imaging results that were available during my care of the patient were reviewed by me and considered in my medical decision making (see chart for details).    MDM Rules/Calculators/A&P                          86 year old female who presents for evaluation of lower back pain.  History of chronic back pain and is on tramadol at home.  No falls, injury.  She reports pain is worse when she moves, particularly she needs to get up from bed.  She ambulates with the assistance of a walker.  No trauma, fall but son does report that she has been  moving things around to help with a move that is upcoming.  On initial ED arrival,  she is afebrile, nontoxic-appearing.  Vital signs are stable.  She is slightly hypertensive.  Likely secondary to pain.  She has tenderness noted to the lower back that extends to the bilateral hips.  No deformity or crepitus noted.  I am able to range of motion the bilateral hips with any difficulty.  Suspect this is most likely chronic back pain associated musculoskeletal pain.  Doubt acute fracture dislocation of her hip but given that she has pain, will plan for pelvis x-ray.  Additionally, history/physical exam not concerning for intra-abdominal pathology.  We will plan for urine.  She does not have any symptoms.  Additionally, history/physical exam not concerning for ACS etiology, aortic dissection.  X-rays reviewed.  She has osteoarthritis noted.  No evidence of acute fracture or dislocation of the hips.  Lumbar x-ray shows progressive spondylosis and facet hypertrophy.  Discussed results with patient and son.  At this time, patient is on tramadol.  She lives at home with a caregiver.  I discussed my hesitancy about starting her on narcotic pain medication and son is in agreement.  Patient is at her normal ambulation status.  Patient is stable from emergent standpoint.  She is moving all extremities and has been able to ambulate with use of her walker which is at baseline.  No indication for further MRI imaging.  I discussed with son that patient can follow-up with her primary care doctor.  When she transitions to assisted living, this may need reevaluation to see if she needs chronic pain medication. At this time, patient exhibits no emergent life-threatening condition that require further evaluation in ED or admission. Discussed patient with Dr. Stevie Kern who is agreeable to plan. Patient had ample opportunity for questions and discussion. All patient's questions were answered with full understanding. Strict return  precautions discussed. Patient expresses understanding and agreement to plan.   Portions of this note were generated with Scientist, clinical (histocompatibility and immunogenetics). Dictation errors may occur despite best attempts at proofreading.   Final Clinical Impression(s) / ED Diagnoses Final diagnoses:  Acute midline low back pain, unspecified whether sciatica present    Rx / DC Orders ED Discharge Orders    None       Maxwell Caul, PA-C 02/07/20 0001    Milagros Loll, MD 02/09/20 1513

## 2020-02-06 NOTE — ED Triage Notes (Signed)
Pt c/o back pain, hx of chronic back pain.

## 2020-02-06 NOTE — Discharge Instructions (Addendum)
Your x-rays today looked reassuring.  You can take the tramadol that you are previously prescribed for acute back pain.  Follow-up with your primary care doctor.  Return emergency department for any worsening back pain, vomiting, fevers or any other worsening concerning symptoms.

## 2020-02-09 DIAGNOSIS — Z111 Encounter for screening for respiratory tuberculosis: Secondary | ICD-10-CM | POA: Diagnosis not present

## 2020-02-09 LAB — URINE CULTURE: Culture: >=100000 — AB

## 2020-02-10 ENCOUNTER — Telehealth: Payer: Self-pay | Admitting: Emergency Medicine

## 2020-02-10 NOTE — Progress Notes (Signed)
ED Antimicrobial Stewardship Positive Culture Follow Up   Lori Ellis is an 86 y.o. female who presented to [facility] on 02/06/2020 with a chief complaint of  Chief Complaint  Patient presents with   Back Pain    Recent Results (from the past 720 hour(s))  Urine culture     Status: Abnormal   Collection Time: 02/06/20  8:48 PM   Specimen: Urine, Random  Result Value Ref Range Status   Specimen Description   Final    URINE, RANDOM Performed at [facility], [address] [place], [place] [address]    Special Requests   Final    NONE Performed at [facility], [address] [place], [place] [address]    Culture >=100,000 COLONIES/mL ESCHERICHIA COLI (A)  Final   Report Status 02/09/2020 FINAL  Final   Organism ID, Bacteria ESCHERICHIA COLI (A)  Final      Susceptibility   Escherichia coli - MIC*    AMPICILLIN <=2 SENSITIVE Sensitive     CEFAZOLIN <=4 SENSITIVE Sensitive     CEFTRIAXONE <=0.25 SENSITIVE Sensitive     CIPROFLOXACIN <=0.25 SENSITIVE Sensitive     GENTAMICIN <=1 SENSITIVE Sensitive     IMIPENEM <=0.25 SENSITIVE Sensitive     NITROFURANTOIN <=16 SENSITIVE Sensitive     TRIMETH/SULFA <=20 SENSITIVE Sensitive     AMPICILLIN/SULBACTAM <=2 SENSITIVE Sensitive     PIP/TAZO <=4 SENSITIVE Sensitive     * >=100,000 COLONIES/mL ESCHERICHIA COLI    The patient appears to have asymptomatic bacteruria. Chief complaint was back pain likely exacerbated by increased lifting per son's report. Afeb, no CBC done, UA not grossly infected. Would recommend holding treatment at this time.   ED Provider: Tanda Rockers, PA-C   Rolley Sims 02/10/2020, 8:40 AM Clinical Pharmacist Monday - Friday phone -  [phone] Saturday - Sunday phone - [phone]

## 2020-02-10 NOTE — Telephone Encounter (Signed)
Post ED Visit - Positive Culture Follow-up  Culture report reviewed by antimicrobial stewardship pharmacist: [facility] []  , Pharm.D. []  Enzo Bi, Pharm.D., BCPS AQ-ID []  , Pharm.D., BCPS [x]  Celedonio Miyamoto, Pharm.D., BCPS []  [place], Garvin Fila.D., BCPS, AAHIVP []  , Pharm.D., BCPS, AAHIVP []  Georgina Pillion, PharmD, BCPS []  , PharmD, BCPS []  [place], PharmD, BCPS []  [address], PharmD []  , PharmD, BCPS []  Estella Husk, PharmD  Pharmacy Team []  Lysle Pearl, PharmD []  , PharmD []  Phillips Climes, PharmD []  , Rph []  Agapito Games) , PharmD []  Verlan Friends, PharmD []  , PharmD []  Mervyn Gay, PharmD []  , PharmD []  Vinnie Level, PharmD []  [facility], PharmD []  , PharmD []  Len Childs, PharmD   Positive urine culture Treated with none, asymptomatic,no further patient follow-up is required at this time.  02/10/2020, 10:09 AM

## 2020-05-31 DIAGNOSIS — E039 Hypothyroidism, unspecified: Secondary | ICD-10-CM | POA: Diagnosis not present

## 2020-05-31 DIAGNOSIS — E1159 Type 2 diabetes mellitus with other circulatory complications: Secondary | ICD-10-CM | POA: Diagnosis not present

## 2020-05-31 DIAGNOSIS — E785 Hyperlipidemia, unspecified: Secondary | ICD-10-CM | POA: Diagnosis not present

## 2020-05-31 DIAGNOSIS — E1169 Type 2 diabetes mellitus with other specified complication: Secondary | ICD-10-CM | POA: Diagnosis not present

## 2020-05-31 DIAGNOSIS — N183 Chronic kidney disease, stage 3 unspecified: Secondary | ICD-10-CM | POA: Diagnosis not present

## 2020-05-31 DIAGNOSIS — I152 Hypertension secondary to endocrine disorders: Secondary | ICD-10-CM | POA: Diagnosis not present

## 2020-06-06 DIAGNOSIS — E559 Vitamin D deficiency, unspecified: Secondary | ICD-10-CM | POA: Diagnosis not present

## 2020-06-06 DIAGNOSIS — Z9181 History of falling: Secondary | ICD-10-CM | POA: Diagnosis not present

## 2020-06-06 DIAGNOSIS — M4316 Spondylolisthesis, lumbar region: Secondary | ICD-10-CM | POA: Diagnosis not present

## 2020-06-06 DIAGNOSIS — N183 Chronic kidney disease, stage 3 unspecified: Secondary | ICD-10-CM | POA: Diagnosis not present

## 2020-06-06 DIAGNOSIS — K219 Gastro-esophageal reflux disease without esophagitis: Secondary | ICD-10-CM | POA: Diagnosis not present

## 2020-06-06 DIAGNOSIS — K5730 Diverticulosis of large intestine without perforation or abscess without bleeding: Secondary | ICD-10-CM | POA: Diagnosis not present

## 2020-06-06 DIAGNOSIS — I129 Hypertensive chronic kidney disease with stage 1 through stage 4 chronic kidney disease, or unspecified chronic kidney disease: Secondary | ICD-10-CM | POA: Diagnosis not present

## 2020-06-06 DIAGNOSIS — E785 Hyperlipidemia, unspecified: Secondary | ICD-10-CM | POA: Diagnosis not present

## 2020-06-06 DIAGNOSIS — E1122 Type 2 diabetes mellitus with diabetic chronic kidney disease: Secondary | ICD-10-CM | POA: Diagnosis not present

## 2020-06-06 DIAGNOSIS — E538 Deficiency of other specified B group vitamins: Secondary | ICD-10-CM | POA: Diagnosis not present

## 2020-06-06 DIAGNOSIS — M5137 Other intervertebral disc degeneration, lumbosacral region: Secondary | ICD-10-CM | POA: Diagnosis not present

## 2020-06-06 DIAGNOSIS — H3530 Unspecified macular degeneration: Secondary | ICD-10-CM | POA: Diagnosis not present

## 2020-06-06 DIAGNOSIS — Z8781 Personal history of (healed) traumatic fracture: Secondary | ICD-10-CM | POA: Diagnosis not present

## 2020-06-06 DIAGNOSIS — G8929 Other chronic pain: Secondary | ICD-10-CM | POA: Diagnosis not present

## 2020-06-06 DIAGNOSIS — M48061 Spinal stenosis, lumbar region without neurogenic claudication: Secondary | ICD-10-CM | POA: Diagnosis not present

## 2020-06-06 DIAGNOSIS — E039 Hypothyroidism, unspecified: Secondary | ICD-10-CM | POA: Diagnosis not present

## 2020-06-06 DIAGNOSIS — M1990 Unspecified osteoarthritis, unspecified site: Secondary | ICD-10-CM | POA: Diagnosis not present

## 2020-06-14 DIAGNOSIS — M4316 Spondylolisthesis, lumbar region: Secondary | ICD-10-CM | POA: Diagnosis not present

## 2020-06-14 DIAGNOSIS — E1122 Type 2 diabetes mellitus with diabetic chronic kidney disease: Secondary | ICD-10-CM | POA: Diagnosis not present

## 2020-06-14 DIAGNOSIS — H3530 Unspecified macular degeneration: Secondary | ICD-10-CM | POA: Diagnosis not present

## 2020-06-14 DIAGNOSIS — N183 Chronic kidney disease, stage 3 unspecified: Secondary | ICD-10-CM | POA: Diagnosis not present

## 2020-06-14 DIAGNOSIS — Z9181 History of falling: Secondary | ICD-10-CM | POA: Diagnosis not present

## 2020-06-14 DIAGNOSIS — E559 Vitamin D deficiency, unspecified: Secondary | ICD-10-CM | POA: Diagnosis not present

## 2020-06-14 DIAGNOSIS — K5730 Diverticulosis of large intestine without perforation or abscess without bleeding: Secondary | ICD-10-CM | POA: Diagnosis not present

## 2020-06-14 DIAGNOSIS — M5137 Other intervertebral disc degeneration, lumbosacral region: Secondary | ICD-10-CM | POA: Diagnosis not present

## 2020-06-14 DIAGNOSIS — M1990 Unspecified osteoarthritis, unspecified site: Secondary | ICD-10-CM | POA: Diagnosis not present

## 2020-06-14 DIAGNOSIS — I129 Hypertensive chronic kidney disease with stage 1 through stage 4 chronic kidney disease, or unspecified chronic kidney disease: Secondary | ICD-10-CM | POA: Diagnosis not present

## 2020-06-14 DIAGNOSIS — G8929 Other chronic pain: Secondary | ICD-10-CM | POA: Diagnosis not present

## 2020-06-14 DIAGNOSIS — E538 Deficiency of other specified B group vitamins: Secondary | ICD-10-CM | POA: Diagnosis not present

## 2020-06-14 DIAGNOSIS — K219 Gastro-esophageal reflux disease without esophagitis: Secondary | ICD-10-CM | POA: Diagnosis not present

## 2020-06-14 DIAGNOSIS — E785 Hyperlipidemia, unspecified: Secondary | ICD-10-CM | POA: Diagnosis not present

## 2020-06-14 DIAGNOSIS — E039 Hypothyroidism, unspecified: Secondary | ICD-10-CM | POA: Diagnosis not present

## 2020-06-14 DIAGNOSIS — M48061 Spinal stenosis, lumbar region without neurogenic claudication: Secondary | ICD-10-CM | POA: Diagnosis not present

## 2020-06-14 DIAGNOSIS — Z8781 Personal history of (healed) traumatic fracture: Secondary | ICD-10-CM | POA: Diagnosis not present

## 2020-06-16 DIAGNOSIS — E538 Deficiency of other specified B group vitamins: Secondary | ICD-10-CM | POA: Diagnosis not present

## 2020-06-16 DIAGNOSIS — M5137 Other intervertebral disc degeneration, lumbosacral region: Secondary | ICD-10-CM | POA: Diagnosis not present

## 2020-06-16 DIAGNOSIS — Z9181 History of falling: Secondary | ICD-10-CM | POA: Diagnosis not present

## 2020-06-16 DIAGNOSIS — E039 Hypothyroidism, unspecified: Secondary | ICD-10-CM | POA: Diagnosis not present

## 2020-06-16 DIAGNOSIS — I129 Hypertensive chronic kidney disease with stage 1 through stage 4 chronic kidney disease, or unspecified chronic kidney disease: Secondary | ICD-10-CM | POA: Diagnosis not present

## 2020-06-16 DIAGNOSIS — E559 Vitamin D deficiency, unspecified: Secondary | ICD-10-CM | POA: Diagnosis not present

## 2020-06-16 DIAGNOSIS — E785 Hyperlipidemia, unspecified: Secondary | ICD-10-CM | POA: Diagnosis not present

## 2020-06-16 DIAGNOSIS — N183 Chronic kidney disease, stage 3 unspecified: Secondary | ICD-10-CM | POA: Diagnosis not present

## 2020-06-16 DIAGNOSIS — G8929 Other chronic pain: Secondary | ICD-10-CM | POA: Diagnosis not present

## 2020-06-16 DIAGNOSIS — H3530 Unspecified macular degeneration: Secondary | ICD-10-CM | POA: Diagnosis not present

## 2020-06-16 DIAGNOSIS — Z8781 Personal history of (healed) traumatic fracture: Secondary | ICD-10-CM | POA: Diagnosis not present

## 2020-06-16 DIAGNOSIS — M4316 Spondylolisthesis, lumbar region: Secondary | ICD-10-CM | POA: Diagnosis not present

## 2020-06-16 DIAGNOSIS — M1990 Unspecified osteoarthritis, unspecified site: Secondary | ICD-10-CM | POA: Diagnosis not present

## 2020-06-16 DIAGNOSIS — K219 Gastro-esophageal reflux disease without esophagitis: Secondary | ICD-10-CM | POA: Diagnosis not present

## 2020-06-16 DIAGNOSIS — K5730 Diverticulosis of large intestine without perforation or abscess without bleeding: Secondary | ICD-10-CM | POA: Diagnosis not present

## 2020-06-16 DIAGNOSIS — M48061 Spinal stenosis, lumbar region without neurogenic claudication: Secondary | ICD-10-CM | POA: Diagnosis not present

## 2020-06-16 DIAGNOSIS — E1122 Type 2 diabetes mellitus with diabetic chronic kidney disease: Secondary | ICD-10-CM | POA: Diagnosis not present

## 2020-06-19 ENCOUNTER — Emergency Department (HOSPITAL_BASED_OUTPATIENT_CLINIC_OR_DEPARTMENT_OTHER)
Admission: EM | Admit: 2020-06-19 | Discharge: 2020-06-19 | Disposition: A | Discharge status: Home / Self Care | Payer: PPO | Attending: Emergency Medicine | Admitting: Emergency Medicine

## 2020-06-19 ENCOUNTER — Emergency Department (HOSPITAL_BASED_OUTPATIENT_CLINIC_OR_DEPARTMENT_OTHER): Payer: PPO

## 2020-06-19 ENCOUNTER — Encounter (HOSPITAL_BASED_OUTPATIENT_CLINIC_OR_DEPARTMENT_OTHER): Payer: Self-pay | Admitting: Emergency Medicine

## 2020-06-19 ENCOUNTER — Other Ambulatory Visit: Payer: Self-pay

## 2020-06-19 DIAGNOSIS — I6782 Cerebral ischemia: Secondary | ICD-10-CM | POA: Insufficient documentation

## 2020-06-19 DIAGNOSIS — Z043 Encounter for examination and observation following other accident: Secondary | ICD-10-CM | POA: Diagnosis not present

## 2020-06-19 DIAGNOSIS — N183 Chronic kidney disease, stage 3 unspecified: Secondary | ICD-10-CM | POA: Diagnosis not present

## 2020-06-19 DIAGNOSIS — E1122 Type 2 diabetes mellitus with diabetic chronic kidney disease: Secondary | ICD-10-CM | POA: Insufficient documentation

## 2020-06-19 DIAGNOSIS — G319 Degenerative disease of nervous system, unspecified: Secondary | ICD-10-CM | POA: Insufficient documentation

## 2020-06-19 DIAGNOSIS — W19XXXA Unspecified fall, initial encounter: Secondary | ICD-10-CM | POA: Insufficient documentation

## 2020-06-19 DIAGNOSIS — M25511 Pain in right shoulder: Secondary | ICD-10-CM | POA: Diagnosis not present

## 2020-06-19 DIAGNOSIS — Z79899 Other long term (current) drug therapy: Secondary | ICD-10-CM | POA: Insufficient documentation

## 2020-06-19 DIAGNOSIS — E039 Hypothyroidism, unspecified: Secondary | ICD-10-CM | POA: Diagnosis not present

## 2020-06-19 DIAGNOSIS — S0990XA Unspecified injury of head, initial encounter: Secondary | ICD-10-CM | POA: Insufficient documentation

## 2020-06-19 DIAGNOSIS — I129 Hypertensive chronic kidney disease with stage 1 through stage 4 chronic kidney disease, or unspecified chronic kidney disease: Secondary | ICD-10-CM | POA: Insufficient documentation

## 2020-06-19 HISTORY — DX: Disorder of thyroid, unspecified: E07.9

## 2020-06-19 HISTORY — DX: Disorder of kidney and ureter, unspecified: N28.9

## 2020-06-19 NOTE — ED Triage Notes (Signed)
Pt comes by POV from senior living facility; pt son reports that they have a protocol that pt must be seen after fall; pt reports unsure of how she fell, but ended up on the floor of the bathroom; pt denies LOC; pt hit head and left right shoulder; pt denies being on blood thinners; pt alert and oriented

## 2020-06-19 NOTE — Discharge Instructions (Addendum)
No serious injury was found from the fall.  Cleared to go back home.

## 2020-06-19 NOTE — ED Notes (Signed)
See EDP assessment 

## 2020-06-19 NOTE — ED Notes (Signed)
C-collar placed on pt.

## 2020-06-19 NOTE — ED Triage Notes (Signed)
No bleeding

## 2020-06-19 NOTE — ED Provider Notes (Signed)
[facility] EMERGENCY DEPARTMENT Provider Note   CSN: [id number] Arrival date & time: 06/19/20  1919     History Chief Complaint  Patient presents with  . Fall    Lori Ellis is a 87 y.o. female.  HPI Patient brought in from senior living.  Patient is here with her son.  Reportedly had a fall.  Did not want to come to the ER but reportedly was protocol.  Unsure how she fell.  Not on blood thinners.  Hit her head but denies loss conscious.  May have had shoulder pain on the right.  Patient repeatedly states she does not want to be here.    Past Medical History:  Diagnosis Date  . Degenerative disc disease, lumbar   . Diabetes mellitus without complication (HCC)   . Hypertension   . Renal disorder   . Thyroid disease     Patient Active Problem List   Diagnosis Date Noted  . Lethargy 01/08/2019  . Closed intertrochanteric fracture, right, initial encounter (HCC) 01/01/2019  . Atrial flutter by electrocardiogram (HCC) 01/01/2019  . Benign essential HTN   . Spinal stenosis, lumbar 06/12/2017  . Chronic bilateral low back pain with bilateral sciatica 03/07/2016  . Spondylolisthesis of lumbar region 03/07/2016  . CKD (chronic kidney disease) stage 3, GFR 30-59 ml/min (HCC) 03/12/2014  . Hypothyroidism 09/02/2013  . Type II diabetes mellitus with renal manifestations (HCC) 09/02/2013  . DDD (degenerative disc disease), lumbosacral 09/01/2013    Past Surgical History:  Procedure Laterality Date  . BLADDER SURGERY    . CHOLECYSTECTOMY    . INTRAMEDULLARY (IM) NAIL INTERTROCHANTERIC Right 01/02/2019   Procedure: INTRAMEDULLARY (IM) NAIL INTERTROCHANTRIC right hip;  Surgeon: Myrene Galas, MD;  Location: [facility] OR;  Service: Orthopedics;  Laterality: Right;     OB History   No obstetric history on file.     No family history on file.  Social History   Tobacco Use  . Smoking status: Never Smoker  . Smokeless tobacco: Never Used  Substance Use Topics  .  Alcohol use: No    Home Medications Prior to Admission medications   Medication Sig Start Date End Date Taking? Authorizing Provider  acetaminophen (TYLENOL) 325 MG tablet Take 2 tablets (650 mg total) by mouth every 6 (six) hours as needed for mild pain. Patient not taking: Reported on 01/01/2019 06/21/17   Burna Cash, MD  amLODipine (NORVASC) 2.5 MG tablet Take 1 tablet (2.5 mg total) by mouth at bedtime. 01/10/19   Laverna Peace, MD  cholecalciferol (VITAMIN D) 25 MCG tablet Take 2 tablets (2,000 Units total) by mouth 2 (two) times daily. 01/03/19   Montez Morita, PA-C  enoxaparin (LOVENOX) 30 MG/0.3ML injection Inject 0.3 mLs (30 mg total) into the skin daily. 01/04/19 02/03/19  Montez Morita, PA-C  gabapentin (NEURONTIN) 300 MG capsule Take 1 capsule (300 mg total) by mouth at bedtime. 01/10/19   Laverna Peace, MD  levothyroxine (SYNTHROID) 75 MCG tablet Take 1 tablet (75 mcg total) by mouth daily before breakfast. 01/10/19   Roberto Scales D, MD  lisinopril (ZESTRIL) 20 MG tablet HOLD until BMP check by doctor 01/10/19   Laverna Peace, MD  Melatonin 3 MG TABS Take 1 tablet (3 mg total) by mouth at bedtime as needed (sleep). 01/10/19   Laverna Peace, MD  Multiple Vitamins-Minerals (CENTRUM SILVER 50+WOMEN) TABS Take 1 tablet by mouth daily with lunch.    [provider]  Multiple Vitamins-Minerals (PRESERVISION AREDS 2) CAPS  Take 1 capsule by mouth every evening.    [provider]  propranolol (INDERAL) 20 MG tablet Take 1-2 tablets (20-40 mg total) by mouth See admin instructions. Take 40 mg by mouth in the morning and 20 mg at 4:30 PM 01/10/19   Roberto Scales D, MD  QUEtiapine (SEROQUEL) 25 MG tablet Take 1 tablet (25 mg total) by mouth every evening. 01/10/19   Laverna Peace, MD  senna (SENOKOT) 8.6 MG TABS tablet Take 1 tablet (8.6 mg total) by mouth 2 (two) times daily. 01/10/19   Laverna Peace, MD  vitamin C (VITAMIN C) 500 MG tablet Take 1 tablet (500  mg total) by mouth daily. 01/04/19   Montez Morita, PA-C    Allergies    Fentanyl, Penicillins, Tetanus toxoid, Adhesive [tape], Advil [ibuprofen], Hydrochlorothiazide, and Naproxen sodium  Review of Systems   Review of Systems  Constitutional: Negative for appetite change.  HENT: Negative for congestion.   Respiratory: Negative for shortness of breath.   Cardiovascular: Negative for chest pain.  Gastrointestinal: Negative for abdominal pain.  Endocrine: Negative for polyuria.  Genitourinary: Negative for flank pain.  Musculoskeletal: Negative for back pain.  Skin: Negative for wound.  Neurological: Negative for syncope and headaches.    Physical Exam Updated Vital Signs BP (!) 160/68   Pulse 73   Temp 98.2 F (36.8 C) (Oral)   Resp 18   SpO2 100%   Physical Exam Vitals and nursing note reviewed.  Constitutional:      Appearance: Normal appearance.  HENT:     Head: Normocephalic and atraumatic.  Eyes:     General: No scleral icterus. Neck:     Comments: Cervical collar been in place.  Removed.  No midline tenderness.  Painless range of motion. Cardiovascular:     Rate and Rhythm: Normal rate.  Chest:     Chest wall: No tenderness.  Abdominal:     Tenderness: There is no abdominal tenderness.  Musculoskeletal:        General: No tenderness.     Cervical back: Neck supple.     Comments: No tenderness to upper extremities.  Skin:    General: Skin is warm.  Neurological:     Mental Status: She is alert.     Comments: Awake and at baseline.  Reportedly does not ambulate at baseline.     ED Results / Procedures / Treatments   Labs (all labs ordered are listed, but only abnormal results are displayed) Labs Reviewed - No data to display  EKG None  Radiology DG Shoulder 1 View Right  Result Date: 06/19/2020 CLINICAL DATA:  Fall EXAM: RIGHT SHOULDER - 1 VIEW COMPARISON:  None. FINDINGS: Single AP view the shoulder seen. Glenohumeral joint osteoarthritis is seen.  Mild overlying soft tissue. There is no evidence of fracture or dislocation. IMPRESSION: Single AP view, no displaced fracture seen. Electronically Signed   By: Jonna Clark M.D.   On: 06/19/2020 20:45   CT Head Wo Contrast  Result Date: 06/19/2020 CLINICAL DATA:  Status post fall.  No loss of consciousness. EXAM: CT HEAD WITHOUT CONTRAST TECHNIQUE: Contiguous axial images were obtained from the base of the skull through the vertex without intravenous contrast. COMPARISON:  None. FINDINGS: Brain: Mild diffuse cortical atrophy is noted. Mild chronic ischemic white matter disease is noted. No mass effect or midline shift is noted. Ventricular size is within normal limits. There is no evidence of mass lesion, hemorrhage or acute infarction. Vascular: No hyperdense vessel  or unexpected calcification. Skull: Normal. Negative for fracture or focal lesion. Sinuses/Orbits: No acute finding. Other: None. IMPRESSION: Mild diffuse cortical atrophy. Mild chronic ischemic white matter disease. No acute intracranial abnormality seen. Electronically Signed   By: Marijo Conception M.D.   On: 06/19/2020 20:48    Procedures Procedures (including critical care time)  Medications Ordered in ED Medications - No data to display  ED Course  I have reviewed the triage vital signs and the nursing notes.  Pertinent labs & imaging results that were available during my care of the patient were reviewed by me and considered in my medical decision making (see chart for details).    MDM Rules/Calculators/A&P                          Patient with reported fall.  Unsure quite what happened.  Does not want more work-up after the imaging.  Patient is here with her son who will drive her back.  Would not want surgery if other abnormality been found.  Will discharge. Final Clinical Impression(s) / ED Diagnoses Final diagnoses:  Fall, initial encounter    Rx / DC Orders ED Discharge Orders    None       Davonna Belling,  MD 06/19/20 2348

## 2020-06-30 DIAGNOSIS — H3530 Unspecified macular degeneration: Secondary | ICD-10-CM | POA: Diagnosis not present

## 2020-06-30 DIAGNOSIS — Z8781 Personal history of (healed) traumatic fracture: Secondary | ICD-10-CM | POA: Diagnosis not present

## 2020-06-30 DIAGNOSIS — I129 Hypertensive chronic kidney disease with stage 1 through stage 4 chronic kidney disease, or unspecified chronic kidney disease: Secondary | ICD-10-CM | POA: Diagnosis not present

## 2020-06-30 DIAGNOSIS — G8929 Other chronic pain: Secondary | ICD-10-CM | POA: Diagnosis not present

## 2020-06-30 DIAGNOSIS — M1990 Unspecified osteoarthritis, unspecified site: Secondary | ICD-10-CM | POA: Diagnosis not present

## 2020-06-30 DIAGNOSIS — M48061 Spinal stenosis, lumbar region without neurogenic claudication: Secondary | ICD-10-CM | POA: Diagnosis not present

## 2020-06-30 DIAGNOSIS — Z9181 History of falling: Secondary | ICD-10-CM | POA: Diagnosis not present

## 2020-06-30 DIAGNOSIS — M5137 Other intervertebral disc degeneration, lumbosacral region: Secondary | ICD-10-CM | POA: Diagnosis not present

## 2020-06-30 DIAGNOSIS — E1122 Type 2 diabetes mellitus with diabetic chronic kidney disease: Secondary | ICD-10-CM | POA: Diagnosis not present

## 2020-06-30 DIAGNOSIS — K219 Gastro-esophageal reflux disease without esophagitis: Secondary | ICD-10-CM | POA: Diagnosis not present

## 2020-06-30 DIAGNOSIS — E559 Vitamin D deficiency, unspecified: Secondary | ICD-10-CM | POA: Diagnosis not present

## 2020-06-30 DIAGNOSIS — N183 Chronic kidney disease, stage 3 unspecified: Secondary | ICD-10-CM | POA: Diagnosis not present

## 2020-06-30 DIAGNOSIS — M4316 Spondylolisthesis, lumbar region: Secondary | ICD-10-CM | POA: Diagnosis not present

## 2020-06-30 DIAGNOSIS — E538 Deficiency of other specified B group vitamins: Secondary | ICD-10-CM | POA: Diagnosis not present

## 2020-06-30 DIAGNOSIS — E039 Hypothyroidism, unspecified: Secondary | ICD-10-CM | POA: Diagnosis not present

## 2020-06-30 DIAGNOSIS — K5730 Diverticulosis of large intestine without perforation or abscess without bleeding: Secondary | ICD-10-CM | POA: Diagnosis not present

## 2020-06-30 DIAGNOSIS — E785 Hyperlipidemia, unspecified: Secondary | ICD-10-CM | POA: Diagnosis not present

## 2020-07-01 DIAGNOSIS — H93292 Other abnormal auditory perceptions, left ear: Secondary | ICD-10-CM | POA: Diagnosis not present

## 2020-07-01 DIAGNOSIS — H6123 Impacted cerumen, bilateral: Secondary | ICD-10-CM | POA: Diagnosis not present

## 2020-07-02 DIAGNOSIS — N183 Chronic kidney disease, stage 3 unspecified: Secondary | ICD-10-CM | POA: Diagnosis not present

## 2020-07-27 ENCOUNTER — Other Ambulatory Visit: Payer: Self-pay

## 2020-07-27 ENCOUNTER — Emergency Department (HOSPITAL_COMMUNITY)
Admission: EM | Admit: 2020-07-27 | Discharge: 2020-07-27 | Disposition: A | Discharge status: Home / Self Care | Payer: PPO | Attending: Emergency Medicine | Admitting: Emergency Medicine

## 2020-07-27 DIAGNOSIS — B9689 Other specified bacterial agents as the cause of diseases classified elsewhere: Secondary | ICD-10-CM | POA: Diagnosis not present

## 2020-07-27 DIAGNOSIS — Z79899 Other long term (current) drug therapy: Secondary | ICD-10-CM | POA: Insufficient documentation

## 2020-07-27 DIAGNOSIS — E039 Hypothyroidism, unspecified: Secondary | ICD-10-CM | POA: Insufficient documentation

## 2020-07-27 DIAGNOSIS — R030 Elevated blood-pressure reading, without diagnosis of hypertension: Secondary | ICD-10-CM | POA: Diagnosis not present

## 2020-07-27 DIAGNOSIS — E1122 Type 2 diabetes mellitus with diabetic chronic kidney disease: Secondary | ICD-10-CM | POA: Insufficient documentation

## 2020-07-27 DIAGNOSIS — N3090 Cystitis, unspecified without hematuria: Secondary | ICD-10-CM | POA: Diagnosis not present

## 2020-07-27 DIAGNOSIS — N183 Chronic kidney disease, stage 3 unspecified: Secondary | ICD-10-CM | POA: Diagnosis not present

## 2020-07-27 DIAGNOSIS — I129 Hypertensive chronic kidney disease with stage 1 through stage 4 chronic kidney disease, or unspecified chronic kidney disease: Secondary | ICD-10-CM | POA: Diagnosis not present

## 2020-07-27 DIAGNOSIS — R4182 Altered mental status, unspecified: Secondary | ICD-10-CM | POA: Diagnosis present

## 2020-07-27 DIAGNOSIS — I10 Essential (primary) hypertension: Secondary | ICD-10-CM | POA: Diagnosis not present

## 2020-07-27 LAB — CBC WITH DIFFERENTIAL/PLATELET
Abs Immature Granulocytes: 0.02 10*3/uL (ref 0.00–0.07)
Basophils Absolute: 0.1 10*3/uL (ref 0.0–0.1)
Basophils Relative: 1 %
Eosinophils Absolute: 0.4 10*3/uL (ref 0.0–0.5)
Eosinophils Relative: 5 %
HCT: 37.8 % (ref 36.0–46.0)
Hemoglobin: 11.7 g/dL — ABNORMAL LOW (ref 12.0–15.0)
Immature Granulocytes: 0 %
Lymphocytes Relative: 38 %
Lymphs Abs: 2.6 10*3/uL (ref 0.7–4.0)
MCH: 29.3 pg (ref 26.0–34.0)
MCHC: 31 g/dL (ref 30.0–36.0)
MCV: 94.7 fL (ref 80.0–100.0)
Monocytes Absolute: 0.8 10*3/uL (ref 0.1–1.0)
Monocytes Relative: 12 %
Neutro Abs: 3 10*3/uL (ref 1.7–7.7)
Neutrophils Relative %: 44 %
Platelets: 208 10*3/uL (ref 150–400)
RBC: 3.99 MIL/uL (ref 3.87–5.11)
RDW: 14.5 % (ref 11.5–15.5)
WBC: 6.8 10*3/uL (ref 4.0–10.5)
nRBC: 0 % (ref 0.0–0.2)

## 2020-07-27 LAB — URINALYSIS, ROUTINE W REFLEX MICROSCOPIC
Bilirubin Urine: NEGATIVE
Glucose, UA: NEGATIVE mg/dL
Hgb urine dipstick: NEGATIVE
Ketones, ur: NEGATIVE mg/dL
Nitrite: POSITIVE — AB
Protein, ur: 30 mg/dL — AB
Specific Gravity, Urine: 1.009 (ref 1.005–1.030)
WBC, UA: >50 WBC/hpf — ABNORMAL HIGH (ref 0–5)
pH: 6 (ref 5.0–8.0)

## 2020-07-27 LAB — COMPREHENSIVE METABOLIC PANEL
ALT: 11 U/L (ref 0–44)
AST: 15 U/L (ref 15–41)
Albumin: 4 g/dL (ref 3.5–5.0)
Alkaline Phosphatase: 73 U/L (ref 38–126)
Anion gap: 10 (ref 5–15)
BUN: 35 mg/dL — ABNORMAL HIGH (ref 8–23)
CO2: 24 mmol/L (ref 22–32)
Calcium: 9.1 mg/dL (ref 8.9–10.3)
Chloride: 106 mmol/L (ref 98–111)
Creatinine, Ser: 1.36 mg/dL — ABNORMAL HIGH (ref 0.44–1.00)
GFR, Estimated: 35 mL/min — ABNORMAL LOW (ref >60)
Glucose, Bld: 193 mg/dL — ABNORMAL HIGH (ref 70–99)
Potassium: 4.1 mmol/L (ref 3.5–5.1)
Sodium: 140 mmol/L (ref 135–145)
Total Bilirubin: 0.5 mg/dL (ref 0.3–1.2)
Total Protein: 7.2 g/dL (ref 6.5–8.1)

## 2020-07-27 MED ORDER — CEPHALEXIN 500 MG PO CAPS: 500.0000 mg | ORAL_CAPSULE | Freq: Three times a day (TID) | ORAL | 0 refills | Status: DC

## 2020-07-27 MED ORDER — LIDOCAINE HCL 1 % IJ SOLN
INTRAMUSCULAR | Status: AC
  Administered 2020-07-27: 2.1 mL
  Filled 2020-07-27: qty 20

## 2020-07-27 MED ORDER — CEFTRIAXONE SODIUM 1 G IJ SOLR
1.0000 g | Freq: Once | INTRAMUSCULAR | Status: AC
  Administered 2020-07-27: 1 g via INTRAMUSCULAR
  Filled 2020-07-27: qty 10

## 2020-07-27 NOTE — ED Provider Notes (Signed)
[facility]-EMERGENCY DEPT Provider Note   CSN: [id number] Arrival date & time: 07/27/20  2005     History Chief Complaint  Patient presents with  . Altered Mental Status    Lori Ellis is a 87 y.o. female.  HPI     87 year old female with history of hypertension, diabetes comes in a chief complaint of altered mental status.  I called [employer], and nobody picked up the call.  Fortunately, patient's son came to the bedside.  He reported that he had seen his mother at 2 PM when she was doing fine.  Suddenly she received a call that patient was extremely upset from the nursing home.  We talked to the patient over the phone and, it was quite apparent to him that patient was upset about the supper.  When he went to the site with food, patient remained agitated and would not speak to anyone.  This severe change in her behavior prompted them to bring her into the ER.  Patient has no new complaints from her side.  She is AO x3.  She does admit to being upset about the supper as it was cold.  She does report urinary frequency on review of system.  Past Medical History:  Diagnosis Date  . Degenerative disc disease, lumbar   . Diabetes mellitus without complication (HCC)   . Hypertension   . Renal disorder   . Thyroid disease     Patient Active Problem List   Diagnosis Date Noted  . Lethargy 01/08/2019  . Closed intertrochanteric fracture, right, initial encounter (HCC) 01/01/2019  . Atrial flutter by electrocardiogram (HCC) 01/01/2019  . Benign essential HTN   . Spinal stenosis, lumbar 06/12/2017  . Chronic bilateral low back pain with bilateral sciatica 03/07/2016  . Spondylolisthesis of lumbar region 03/07/2016  . CKD (chronic kidney disease) stage 3, GFR 30-59 ml/min (HCC) 03/12/2014  . Hypothyroidism 09/02/2013  . Type II diabetes mellitus with renal manifestations (HCC) 09/02/2013  . DDD (degenerative disc disease), lumbosacral 09/01/2013     Past Surgical History:  Procedure Laterality Date  . BLADDER SURGERY    . CHOLECYSTECTOMY    . INTRAMEDULLARY (IM) NAIL INTERTROCHANTERIC Right 01/02/2019   Procedure: INTRAMEDULLARY (IM) NAIL INTERTROCHANTRIC right hip;  Surgeon: Myrene Galas, MD;  Location: [facility] OR;  Service: Orthopedics;  Laterality: Right;     OB History   No obstetric history on file.     No family history on file.  Social History   Tobacco Use  . Smoking status: Never Smoker  . Smokeless tobacco: Never Used  Substance Use Topics  . Alcohol use: No    Home Medications Prior to Admission medications   Medication Sig Start Date End Date Taking? Authorizing Provider  cephALEXin (KEFLEX) 500 MG capsule Take 1 capsule (500 mg total) by mouth 3 (three) times daily. 07/27/20  Yes Derwood Kaplan, MD  acetaminophen (TYLENOL) 325 MG tablet Take 2 tablets (650 mg total) by mouth every 6 (six) hours as needed for mild pain. Patient not taking: Reported on 01/01/2019 06/21/17   Burna Cash, MD  amLODipine (NORVASC) 2.5 MG tablet Take 1 tablet (2.5 mg total) by mouth at bedtime. 01/10/19   Laverna Peace, MD  cholecalciferol (VITAMIN D) 25 MCG tablet Take 2 tablets (2,000 Units total) by mouth 2 (two) times daily. 01/03/19   Montez Morita, PA-C  gabapentin (NEURONTIN) 300 MG capsule Take 1 capsule (300 mg total) by mouth at bedtime. 01/10/19   Nettey,  Elder Cyphers, MD  levothyroxine (SYNTHROID) 75 MCG tablet Take 1 tablet (75 mcg total) by mouth daily before breakfast. 01/10/19   Roberto Scales D, MD  Melatonin 3 MG TABS Take 1 tablet (3 mg total) by mouth at bedtime as needed (sleep). 01/10/19   Laverna Peace, MD  propranolol (INDERAL) 20 MG tablet Take 1-2 tablets (20-40 mg total) by mouth See admin instructions. Take 40 mg by mouth in the morning and 20 mg at 4:30 PM 01/10/19   Roberto Scales D, MD  QUEtiapine (SEROQUEL) 25 MG tablet Take 1 tablet (25 mg total) by mouth every evening. 01/10/19   Laverna Peace, MD   senna (SENOKOT) 8.6 MG TABS tablet Take 1 tablet (8.6 mg total) by mouth 2 (two) times daily. 01/10/19   Laverna Peace, MD  vitamin C (VITAMIN C) 500 MG tablet Take 1 tablet (500 mg total) by mouth daily. 01/04/19   Montez Morita, PA-C    Allergies    Fentanyl, Penicillins, Tetanus toxoid, Adhesive [tape], Advil [ibuprofen], Hydrochlorothiazide, and Naproxen sodium  Review of Systems   Review of Systems  Constitutional: Positive for activity change.  Respiratory: Negative for shortness of breath.   Cardiovascular: Negative for chest pain.  Genitourinary: Positive for frequency. Negative for dysuria.  Allergic/Immunologic: Negative for immunocompromised state.    Physical Exam Updated Vital Signs BP (!) 165/62 (BP Location: Right Arm)   Pulse 60   Temp 98.4 F (36.9 C) (Oral)   Resp 18   Ht 5\' 3"  (1.6 m)   Wt 53 kg   SpO2 99%   BMI 20.70 kg/m   Physical Exam Vitals and nursing note reviewed.  Constitutional:      Appearance: She is well-developed.  HENT:     Head: Normocephalic and atraumatic.  Eyes:     Extraocular Movements: EOM normal.  Cardiovascular:     Rate and Rhythm: Normal rate.  Pulmonary:     Effort: Pulmonary effort is normal.  Abdominal:     General: Bowel sounds are normal.  Musculoskeletal:     Cervical back: Normal range of motion and neck supple.  Skin:    General: Skin is warm and dry.  Neurological:     General: No focal deficit present.     Mental Status: She is alert and oriented to person, place, and time.     Cranial Nerves: No cranial nerve deficit.     Motor: No weakness.     ED Results / Procedures / Treatments   Labs (all labs ordered are listed, but only abnormal results are displayed) Labs Reviewed  COMPREHENSIVE METABOLIC PANEL - Abnormal; Notable for the following components:      Result Value   Glucose, Bld 193 (*)    BUN 35 (*)    Creatinine, Ser 1.36 (*)    GFR, Estimated 35 (*)    All other components within normal  limits  CBC WITH DIFFERENTIAL/PLATELET - Abnormal; Notable for the following components:   Hemoglobin 11.7 (*)    All other components within normal limits  URINALYSIS, ROUTINE W REFLEX MICROSCOPIC - Abnormal; Notable for the following components:   APPearance CLOUDY (*)    Protein, ur 30 (*)    Nitrite POSITIVE (*)    Leukocytes,Ua LARGE (*)    WBC, UA >50 (*)    Bacteria, UA RARE (*)    All other components within normal limits    EKG None  Radiology No results found.  Procedures Procedures  Medications Ordered in ED Medications  cefTRIAXone (ROCEPHIN) injection 1 g (has no administration in time range)    ED Course  I have reviewed the triage vital signs and the nursing notes.  Pertinent labs & imaging results that were available during my care of the patient were reviewed by me and considered in my medical decision making (see chart for details).    MDM Rules/Calculators/A&P                          Patient comes in a chief complaint of altered mental status.  She is back to baseline.  I do not think she had a TIA or seizure.  Patient's review of system is positive for urinary frequency only.  UA and basic labs sent.  Urine analysis does have nitrite positive urine and pyuria.  We will give her ceftriaxone.  Stable for discharge.  Final Clinical Impression(s) / ED Diagnoses Final diagnoses:  Cystitis    Rx / DC Orders ED Discharge Orders         Ordered    cephALEXin (KEFLEX) 500 MG capsule  3 times daily        07/27/20 2252           Derwood Kaplan, MD 07/27/20 2256

## 2020-07-27 NOTE — Discharge Instructions (Signed)
Patient was seen in the ER for mental status change.  Her urinalysis here does show some signs of infection.  Given the change in her mental status, we suspect UTI might be contributing.  Please give her the antibiotics prescribed.

## 2020-07-27 NOTE — ED Notes (Signed)
Lori Ellis appears to have returned to baseline. Lori Ellis as this RN why she was here; this RN replied "Because you wouldn't answer the staff at your house." Lori Ellis replies "Maybe I didn't understand them." Lori Ellis is asking for her son [name] at this time. And was able to recall their visit from earlier as well as what time he usually gets off work (1300)

## 2020-07-27 NOTE — ED Triage Notes (Signed)
Per pt son; pt was A&O at around 1400 when he visited with her. Pt son states that he received a call from [facility] stating that pt would not follow commands and was beginning to get combative. Pt son was on scene prior to transport and witnessed pt behavior and decided to have pt evaluated medically for the quick onset AMS.

## 2020-09-01 DIAGNOSIS — G4700 Insomnia, unspecified: Secondary | ICD-10-CM | POA: Diagnosis not present

## 2020-09-01 DIAGNOSIS — E039 Hypothyroidism, unspecified: Secondary | ICD-10-CM | POA: Diagnosis not present

## 2020-09-01 DIAGNOSIS — K5901 Slow transit constipation: Secondary | ICD-10-CM | POA: Diagnosis not present

## 2020-09-01 DIAGNOSIS — I10 Essential (primary) hypertension: Secondary | ICD-10-CM | POA: Diagnosis not present

## 2020-09-02 ENCOUNTER — Observation Stay (HOSPITAL_BASED_OUTPATIENT_CLINIC_OR_DEPARTMENT_OTHER)
Admission: EM | Admit: 2020-09-02 | Discharge: 2020-09-03 | Disposition: A | Discharge status: Home / Self Care | Payer: PPO | Source: Home / Self Care | Attending: Emergency Medicine | Admitting: Emergency Medicine

## 2020-09-02 ENCOUNTER — Other Ambulatory Visit: Payer: Self-pay

## 2020-09-02 ENCOUNTER — Emergency Department (HOSPITAL_COMMUNITY): Payer: PPO

## 2020-09-02 ENCOUNTER — Encounter (HOSPITAL_COMMUNITY): Payer: Self-pay

## 2020-09-02 DIAGNOSIS — N179 Acute kidney failure, unspecified: Secondary | ICD-10-CM | POA: Diagnosis present

## 2020-09-02 DIAGNOSIS — I10 Essential (primary) hypertension: Secondary | ICD-10-CM | POA: Diagnosis present

## 2020-09-02 DIAGNOSIS — R55 Syncope and collapse: Secondary | ICD-10-CM | POA: Diagnosis present

## 2020-09-02 DIAGNOSIS — R4182 Altered mental status, unspecified: Secondary | ICD-10-CM | POA: Diagnosis not present

## 2020-09-02 DIAGNOSIS — R5383 Other fatigue: Secondary | ICD-10-CM | POA: Diagnosis not present

## 2020-09-02 DIAGNOSIS — R54 Age-related physical debility: Secondary | ICD-10-CM | POA: Diagnosis present

## 2020-09-02 DIAGNOSIS — R0902 Hypoxemia: Secondary | ICD-10-CM | POA: Diagnosis not present

## 2020-09-02 DIAGNOSIS — G9340 Encephalopathy, unspecified: Secondary | ICD-10-CM | POA: Diagnosis not present

## 2020-09-02 DIAGNOSIS — E559 Vitamin D deficiency, unspecified: Secondary | ICD-10-CM | POA: Diagnosis present

## 2020-09-02 DIAGNOSIS — G8929 Other chronic pain: Secondary | ICD-10-CM | POA: Diagnosis present

## 2020-09-02 DIAGNOSIS — M48061 Spinal stenosis, lumbar region without neurogenic claudication: Secondary | ICD-10-CM | POA: Diagnosis present

## 2020-09-02 DIAGNOSIS — E872 Acidosis: Secondary | ICD-10-CM | POA: Diagnosis present

## 2020-09-02 DIAGNOSIS — R4020 Unspecified coma: Secondary | ICD-10-CM | POA: Diagnosis not present

## 2020-09-02 DIAGNOSIS — M5442 Lumbago with sciatica, left side: Secondary | ICD-10-CM | POA: Diagnosis present

## 2020-09-02 DIAGNOSIS — R1084 Generalized abdominal pain: Secondary | ICD-10-CM | POA: Diagnosis not present

## 2020-09-02 DIAGNOSIS — L89616 Pressure-induced deep tissue damage of right heel: Secondary | ICD-10-CM | POA: Diagnosis not present

## 2020-09-02 DIAGNOSIS — E1122 Type 2 diabetes mellitus with diabetic chronic kidney disease: Secondary | ICD-10-CM | POA: Insufficient documentation

## 2020-09-02 DIAGNOSIS — M542 Cervicalgia: Secondary | ICD-10-CM | POA: Diagnosis not present

## 2020-09-02 DIAGNOSIS — E039 Hypothyroidism, unspecified: Secondary | ICD-10-CM | POA: Diagnosis present

## 2020-09-02 DIAGNOSIS — N3000 Acute cystitis without hematuria: Secondary | ICD-10-CM | POA: Diagnosis not present

## 2020-09-02 DIAGNOSIS — E875 Hyperkalemia: Secondary | ICD-10-CM | POA: Insufficient documentation

## 2020-09-02 DIAGNOSIS — N390 Urinary tract infection, site not specified: Secondary | ICD-10-CM | POA: Diagnosis present

## 2020-09-02 DIAGNOSIS — Z880 Allergy status to penicillin: Secondary | ICD-10-CM | POA: Insufficient documentation

## 2020-09-02 DIAGNOSIS — E1142 Type 2 diabetes mellitus with diabetic polyneuropathy: Secondary | ICD-10-CM | POA: Diagnosis present

## 2020-09-02 DIAGNOSIS — J9811 Atelectasis: Secondary | ICD-10-CM | POA: Diagnosis not present

## 2020-09-02 DIAGNOSIS — N1832 Chronic kidney disease, stage 3b: Secondary | ICD-10-CM | POA: Insufficient documentation

## 2020-09-02 DIAGNOSIS — Z7189 Other specified counseling: Secondary | ICD-10-CM | POA: Diagnosis not present

## 2020-09-02 DIAGNOSIS — E1129 Type 2 diabetes mellitus with other diabetic kidney complication: Secondary | ICD-10-CM | POA: Diagnosis present

## 2020-09-02 DIAGNOSIS — Z20822 Contact with and (suspected) exposure to covid-19: Secondary | ICD-10-CM | POA: Diagnosis present

## 2020-09-02 DIAGNOSIS — G629 Polyneuropathy, unspecified: Secondary | ICD-10-CM | POA: Insufficient documentation

## 2020-09-02 DIAGNOSIS — N183 Chronic kidney disease, stage 3 unspecified: Secondary | ICD-10-CM | POA: Diagnosis present

## 2020-09-02 DIAGNOSIS — I6782 Cerebral ischemia: Secondary | ICD-10-CM | POA: Diagnosis not present

## 2020-09-02 DIAGNOSIS — I1310 Hypertensive heart and chronic kidney disease without heart failure, with stage 1 through stage 4 chronic kidney disease, or unspecified chronic kidney disease: Secondary | ICD-10-CM | POA: Diagnosis present

## 2020-09-02 DIAGNOSIS — Z66 Do not resuscitate: Secondary | ICD-10-CM | POA: Diagnosis present

## 2020-09-02 DIAGNOSIS — R52 Pain, unspecified: Secondary | ICD-10-CM | POA: Diagnosis not present

## 2020-09-02 DIAGNOSIS — Z79899 Other long term (current) drug therapy: Secondary | ICD-10-CM | POA: Insufficient documentation

## 2020-09-02 DIAGNOSIS — R404 Transient alteration of awareness: Secondary | ICD-10-CM | POA: Diagnosis not present

## 2020-09-02 DIAGNOSIS — N1831 Chronic kidney disease, stage 3a: Secondary | ICD-10-CM | POA: Diagnosis not present

## 2020-09-02 DIAGNOSIS — I517 Cardiomegaly: Secondary | ICD-10-CM | POA: Diagnosis not present

## 2020-09-02 DIAGNOSIS — D696 Thrombocytopenia, unspecified: Secondary | ICD-10-CM | POA: Diagnosis not present

## 2020-09-02 DIAGNOSIS — R4189 Other symptoms and signs involving cognitive functions and awareness: Secondary | ICD-10-CM | POA: Diagnosis present

## 2020-09-02 DIAGNOSIS — I129 Hypertensive chronic kidney disease with stage 1 through stage 4 chronic kidney disease, or unspecified chronic kidney disease: Secondary | ICD-10-CM | POA: Insufficient documentation

## 2020-09-02 DIAGNOSIS — Z515 Encounter for palliative care: Secondary | ICD-10-CM | POA: Diagnosis not present

## 2020-09-02 DIAGNOSIS — R21 Rash and other nonspecific skin eruption: Secondary | ICD-10-CM | POA: Diagnosis not present

## 2020-09-02 DIAGNOSIS — R627 Adult failure to thrive: Secondary | ICD-10-CM | POA: Diagnosis present

## 2020-09-02 DIAGNOSIS — G9341 Metabolic encephalopathy: Secondary | ICD-10-CM | POA: Diagnosis present

## 2020-09-02 DIAGNOSIS — M5441 Lumbago with sciatica, right side: Secondary | ICD-10-CM | POA: Insufficient documentation

## 2020-09-02 DIAGNOSIS — E860 Dehydration: Secondary | ICD-10-CM | POA: Diagnosis present

## 2020-09-02 DIAGNOSIS — L89152 Pressure ulcer of sacral region, stage 2: Secondary | ICD-10-CM | POA: Diagnosis not present

## 2020-09-02 LAB — TROPONIN I (HIGH SENSITIVITY)
Troponin I (High Sensitivity): 11 ng/L (ref <18)
Troponin I (High Sensitivity): 11 ng/L (ref <18)

## 2020-09-02 LAB — CBC
HCT: 37.9 % (ref 36.0–46.0)
Hemoglobin: 12 g/dL (ref 12.0–15.0)
MCH: 30.2 pg (ref 26.0–34.0)
MCHC: 31.7 g/dL (ref 30.0–36.0)
MCV: 95.2 fL (ref 80.0–100.0)
Platelets: 208 10*3/uL (ref 150–400)
RBC: 3.98 MIL/uL (ref 3.87–5.11)
RDW: 14.6 % (ref 11.5–15.5)
WBC: 10.2 10*3/uL (ref 4.0–10.5)
nRBC: 0 % (ref 0.0–0.2)

## 2020-09-02 LAB — COMPREHENSIVE METABOLIC PANEL
ALT: 18 U/L (ref 0–44)
AST: 34 U/L (ref 15–41)
Albumin: 3.7 g/dL (ref 3.5–5.0)
Alkaline Phosphatase: 64 U/L (ref 38–126)
Anion gap: 8 (ref 5–15)
BUN: 32 mg/dL — ABNORMAL HIGH (ref 8–23)
CO2: 24 mmol/L (ref 22–32)
Calcium: 8.8 mg/dL — ABNORMAL LOW (ref 8.9–10.3)
Chloride: 106 mmol/L (ref 98–111)
Creatinine, Ser: 1.38 mg/dL — ABNORMAL HIGH (ref 0.44–1.00)
GFR, Estimated: 35 mL/min — ABNORMAL LOW (ref >60)
Glucose, Bld: 182 mg/dL — ABNORMAL HIGH (ref 70–99)
Potassium: 5.7 mmol/L — ABNORMAL HIGH (ref 3.5–5.1)
Sodium: 138 mmol/L (ref 135–145)
Total Bilirubin: 0.9 mg/dL (ref 0.3–1.2)
Total Protein: 7 g/dL (ref 6.5–8.1)

## 2020-09-02 LAB — URINALYSIS, ROUTINE W REFLEX MICROSCOPIC
Bilirubin Urine: NEGATIVE
Glucose, UA: NEGATIVE mg/dL
Hgb urine dipstick: NEGATIVE
Ketones, ur: NEGATIVE mg/dL
Nitrite: POSITIVE — AB
Protein, ur: 30 mg/dL — AB
Specific Gravity, Urine: 1.009 (ref 1.005–1.030)
pH: 6 (ref 5.0–8.0)

## 2020-09-02 LAB — CBC WITH DIFFERENTIAL/PLATELET
Abs Immature Granulocytes: 0.03 10*3/uL (ref 0.00–0.07)
Basophils Absolute: 0.1 10*3/uL (ref 0.0–0.1)
Basophils Relative: 1 %
Eosinophils Absolute: 0.3 10*3/uL (ref 0.0–0.5)
Eosinophils Relative: 4 %
HCT: 35.6 % — ABNORMAL LOW (ref 36.0–46.0)
Hemoglobin: 11.1 g/dL — ABNORMAL LOW (ref 12.0–15.0)
Immature Granulocytes: 0 %
Lymphocytes Relative: 23 %
Lymphs Abs: 1.9 10*3/uL (ref 0.7–4.0)
MCH: 30 pg (ref 26.0–34.0)
MCHC: 31.2 g/dL (ref 30.0–36.0)
MCV: 96.2 fL (ref 80.0–100.0)
Monocytes Absolute: 0.7 10*3/uL (ref 0.1–1.0)
Monocytes Relative: 9 %
Neutro Abs: 5.3 10*3/uL (ref 1.7–7.7)
Neutrophils Relative %: 63 %
Platelets: 150 10*3/uL (ref 150–400)
RBC: 3.7 MIL/uL — ABNORMAL LOW (ref 3.87–5.11)
RDW: 14.9 % (ref 11.5–15.5)
WBC: 8.3 10*3/uL (ref 4.0–10.5)
nRBC: 0 % (ref 0.0–0.2)

## 2020-09-02 LAB — CREATININE, SERUM
Creatinine, Ser: 1.06 mg/dL — ABNORMAL HIGH (ref 0.44–1.00)
GFR, Estimated: 48 mL/min — ABNORMAL LOW (ref >60)

## 2020-09-02 LAB — POTASSIUM: Potassium: 4.2 mmol/L (ref 3.5–5.1)

## 2020-09-02 LAB — HEMOGLOBIN A1C
Hgb A1c MFr Bld: 6.6 % — ABNORMAL HIGH (ref 4.8–5.6)
Mean Plasma Glucose: 142.72 mg/dL

## 2020-09-02 LAB — TSH: TSH: 2.819 u[IU]/mL (ref 0.350–4.500)

## 2020-09-02 LAB — SARS CORONAVIRUS 2 (TAT 6-24 HRS): SARS Coronavirus 2: NEGATIVE

## 2020-09-02 MED ORDER — PROPRANOLOL HCL 20 MG PO TABS: 40.0000 mg | ORAL_TABLET | Freq: Every day | ORAL | Status: DC

## 2020-09-02 MED ORDER — AMLODIPINE BESYLATE 5 MG PO TABS: 5.0000 mg | ORAL_TABLET | Freq: Every day | ORAL | Status: DC

## 2020-09-02 MED ORDER — LISINOPRIL 5 MG PO TABS: 5.0000 mg | ORAL_TABLET | Freq: Every day | ORAL | Status: DC

## 2020-09-02 MED ORDER — MELATONIN 3 MG PO TABS
3.0000 mg | ORAL_TABLET | Freq: Every evening | ORAL | Status: DC | PRN
Start: 2020-09-02 — End: 2020-09-03

## 2020-09-02 MED ORDER — LISINOPRIL 10 MG PO TABS
5.0000 mg | ORAL_TABLET | Freq: Every day | ORAL | Status: DC
  Filled 2020-09-02: qty 1

## 2020-09-02 MED ORDER — INSULIN ASPART 100 UNIT/ML ~~LOC~~ SOLN
0.0000 [IU] | Freq: Every day | SUBCUTANEOUS | Status: DC
  Filled 2020-09-02: qty 0.05

## 2020-09-02 MED ORDER — ONDANSETRON HCL 4 MG/2ML IJ SOLN: 4.0000 mg | Freq: Four times a day (QID) | INTRAMUSCULAR | Status: DC | PRN

## 2020-09-02 MED ORDER — SENNA 8.6 MG PO TABS: 1.0000 | ORAL_TABLET | Freq: Every day | ORAL | Status: DC | PRN

## 2020-09-02 MED ORDER — LEVOTHYROXINE SODIUM 75 MCG PO TABS: 75.0000 ug | ORAL_TABLET | Freq: Every day | ORAL | Status: DC

## 2020-09-02 MED ORDER — ONDANSETRON HCL 4 MG PO TABS: 4.0000 mg | ORAL_TABLET | Freq: Four times a day (QID) | ORAL | Status: DC | PRN

## 2020-09-02 MED ORDER — VITAMIN D 25 MCG (1000 UNIT) PO TABS: 1000.0000 [IU] | ORAL_TABLET | Freq: Every day | ORAL | Status: DC

## 2020-09-02 MED ORDER — ACETAMINOPHEN 325 MG PO TABS: 650.0000 mg | ORAL_TABLET | Freq: Four times a day (QID) | ORAL | Status: DC | PRN

## 2020-09-02 MED ORDER — ACETAMINOPHEN 650 MG RE SUPP: 650.0000 mg | Freq: Four times a day (QID) | RECTAL | Status: DC | PRN

## 2020-09-02 MED ORDER — INSULIN ASPART 100 UNIT/ML ~~LOC~~ SOLN
0.0000 [IU] | Freq: Three times a day (TID) | SUBCUTANEOUS | Status: DC
  Filled 2020-09-02: qty 0.06

## 2020-09-02 MED ORDER — ASCORBIC ACID 500 MG PO TABS
500.0000 mg | ORAL_TABLET | Freq: Every day | ORAL | Status: DC
  Filled 2020-09-02: qty 1

## 2020-09-02 MED ORDER — GABAPENTIN 300 MG PO CAPS: 300.0000 mg | ORAL_CAPSULE | Freq: Two times a day (BID) | ORAL | Status: DC

## 2020-09-02 MED ORDER — HALOPERIDOL LACTATE 5 MG/ML IJ SOLN
1.0000 mg | Freq: Once | INTRAMUSCULAR | Status: AC
  Administered 2020-09-03: 1 mg via INTRAVENOUS
  Filled 2020-09-02: qty 1

## 2020-09-02 MED ORDER — PROPRANOLOL HCL 20 MG PO TABS: 20.0000 mg | ORAL_TABLET | ORAL | Status: DC

## 2020-09-02 MED ORDER — ENOXAPARIN SODIUM 30 MG/0.3ML ~~LOC~~ SOLN: 30.0000 mg | SUBCUTANEOUS | Status: DC

## 2020-09-02 MED ORDER — AMLODIPINE BESYLATE 5 MG PO TABS
5.0000 mg | ORAL_TABLET | Freq: Every day | ORAL | Status: DC
  Filled 2020-09-02: qty 1

## 2020-09-02 MED ORDER — AMLODIPINE BESYLATE 5 MG PO TABS: 2.5000 mg | ORAL_TABLET | Freq: Every day | ORAL | Status: DC

## 2020-09-02 NOTE — ED Triage Notes (Signed)
Pt BIB EMS from [facility] on [employer] [address]. Staff found her on toilet after she had a syncopal episode. Pt did not fall or have any injury. Pt had trouble controlling her bowels. A&O x4.   20G LFA 139/62 CBG 198

## 2020-09-02 NOTE — ED Provider Notes (Signed)
[facility]-EMERGENCY DEPT Provider Note   CSN: [id number] Arrival date & time: 09/02/20  1116     History Chief Complaint  Patient presents with  . Loss of Consciousness    Lori Ellis is a 87 y.o. female.  87 year old female with prior medical history as detailed below presents for evaluation.  Patient was found by staff unresponsive on her toilet.  Patient cannot recall details of the event.  Patient appears to of had a syncopal event while using the bathroom.  Patient currently is comfortable.  She denies current complaint including chest pain, palpitations, shortness of breath, or other specific complaint.  Son is present at bedside.  He denies known recent illness.  The history is provided by the patient, a relative and medical records.  Loss of Consciousness Episode history:  Single Most recent episode:  Today Timing:  Unable to specify Progression:  Unable to specify Chronicity:  New Witnessed: no   Relieved by:  Nothing Worsened by:  Nothing      Past Medical History:  Diagnosis Date  . Degenerative disc disease, lumbar   . Diabetes mellitus without complication (HCC)   . Hypertension   . Renal disorder   . Thyroid disease     Patient Active Problem List   Diagnosis Date Noted  . Lethargy 01/08/2019  . Closed intertrochanteric fracture, right, initial encounter (HCC) 01/01/2019  . Atrial flutter by electrocardiogram (HCC) 01/01/2019  . Benign essential HTN   . Spinal stenosis, lumbar 06/12/2017  . Chronic bilateral low back pain with bilateral sciatica 03/07/2016  . Spondylolisthesis of lumbar region 03/07/2016  . CKD (chronic kidney disease) stage 3, GFR 30-59 ml/min (HCC) 03/12/2014  . Hypothyroidism 09/02/2013  . Type II diabetes mellitus with renal manifestations (HCC) 09/02/2013  . DDD (degenerative disc disease), lumbosacral 09/01/2013    Past Surgical History:  Procedure Laterality Date  . BLADDER SURGERY    .  CHOLECYSTECTOMY    . INTRAMEDULLARY (IM) NAIL INTERTROCHANTERIC Right 01/02/2019   Procedure: INTRAMEDULLARY (IM) NAIL INTERTROCHANTRIC right hip;  Surgeon: Myrene Galas, MD;  Location: [facility] OR;  Service: Orthopedics;  Laterality: Right;     OB History   No obstetric history on file.     History reviewed. No pertinent family history.  Social History   Tobacco Use  . Smoking status: Never Smoker  . Smokeless tobacco: Never Used  Substance Use Topics  . Alcohol use: No    Home Medications Prior to Admission medications   Medication Sig Start Date End Date Taking? Authorizing Provider  acetaminophen (TYLENOL) 325 MG tablet Take 2 tablets (650 mg total) by mouth every 6 (six) hours as needed for mild pain. Patient taking differently: Take 650 mg by mouth in the morning and at bedtime. 06/21/17   Santos-Sanchez, Chelsea Primus, MD  amLODipine (NORVASC) 2.5 MG tablet Take 1 tablet (2.5 mg total) by mouth at bedtime. 01/10/19   Roberto Scales D, MD  amLODipine (NORVASC) 5 MG tablet Take 5 mg by mouth daily.    [provider]  cephALEXin (KEFLEX) 500 MG capsule Take 1 capsule (500 mg total) by mouth 3 (three) times daily. 07/27/20   Derwood Kaplan, MD  cholecalciferol (VITAMIN D) 25 MCG tablet Take 2 tablets (2,000 Units total) by mouth 2 (two) times daily. Patient taking differently: Take 1,000 Units by mouth daily. 01/03/19   Montez Morita, PA-C  gabapentin (NEURONTIN) 300 MG capsule Take 1 capsule (300 mg total) by mouth at bedtime. Patient taking differently: Take  300 mg by mouth 2 (two) times daily. 01/10/19   Laverna Peace, MD  levothyroxine (SYNTHROID) 75 MCG tablet Take 1 tablet (75 mcg total) by mouth daily before breakfast. 01/10/19   Roberto Scales D, MD  lisinopril (ZESTRIL) 5 MG tablet Take 5 mg by mouth daily.    [provider]  Melatonin 3 MG TABS Take 1 tablet (3 mg total) by mouth at bedtime as needed (sleep). 01/10/19   Laverna Peace, MD  propranolol (INDERAL) 20  MG tablet Take 1-2 tablets (20-40 mg total) by mouth See admin instructions. Take 40 mg by mouth in the morning and 20 mg at 4:30 PM 01/10/19   Roberto Scales D, MD  QUEtiapine (SEROQUEL) 25 MG tablet Take 1 tablet (25 mg total) by mouth every evening. Patient not taking: Reported on 07/27/2020 01/10/19   Laverna Peace, MD  senna (SENOKOT) 8.6 MG TABS tablet Take 1 tablet (8.6 mg total) by mouth 2 (two) times daily. Patient taking differently: Take 1 tablet by mouth daily as needed for mild constipation. 01/10/19   Laverna Peace, MD  traMADol (ULTRAM) 50 MG tablet Take 50 mg by mouth every 12 (twelve) hours as needed for moderate pain.    [provider]  vitamin C (VITAMIN C) 500 MG tablet Take 1 tablet (500 mg total) by mouth daily. 01/04/19   Montez Morita, PA-C    Allergies    Fentanyl, Penicillins, Tetanus toxoid, Adhesive [tape], Advil [ibuprofen], Hydrochlorothiazide, and Naproxen sodium  Review of Systems   Review of Systems  Cardiovascular: Positive for syncope.  All other systems reviewed and are negative.   Physical Exam Updated Vital Signs BP (!) 150/65   Pulse (!) 50   Temp 98 F (36.7 C) (Oral)   Resp 12   SpO2 100%   Physical Exam Vitals and nursing note reviewed.  Constitutional:      General: She is not in acute distress.    Appearance: Normal appearance. She is well-developed.  HENT:     Head: Normocephalic and atraumatic.  Eyes:     Conjunctiva/sclera: Conjunctivae normal.     Pupils: Pupils are equal, round, and reactive to light.  Cardiovascular:     Rate and Rhythm: Normal rate and regular rhythm.     Heart sounds: Normal heart sounds.  Pulmonary:     Effort: Pulmonary effort is normal. No respiratory distress.     Breath sounds: Normal breath sounds.  Abdominal:     General: There is no distension.     Palpations: Abdomen is soft.     Tenderness: There is no abdominal tenderness.  Musculoskeletal:        General: No deformity. Normal range  of motion.     Cervical back: Normal range of motion and neck supple.  Skin:    General: Skin is warm and dry.  Neurological:     Mental Status: She is alert and oriented to person, place, and time.     ED Results / Procedures / Treatments   Labs (all labs ordered are listed, but only abnormal results are displayed) Labs Reviewed  COMPREHENSIVE METABOLIC PANEL - Abnormal; Notable for the following components:      Result Value   Potassium 5.7 (*)    Glucose, Bld 182 (*)    BUN 32 (*)    Creatinine, Ser 1.38 (*)    Calcium 8.8 (*)    GFR, Estimated 35 (*)    All other components within normal limits  CBC WITH DIFFERENTIAL/PLATELET - Abnormal; Notable for the following components:   RBC 3.70 (*)    Hemoglobin 11.1 (*)    HCT 35.6 (*)    All other components within normal limits  SARS CORONAVIRUS 2 (TAT 6-24 HRS)  URINALYSIS, ROUTINE W REFLEX MICROSCOPIC  TROPONIN I (HIGH SENSITIVITY)  TROPONIN I (HIGH SENSITIVITY)    EKG EKG Interpretation  Date/Time:  Thursday September 02 2020 11:35:56 EDT Ventricular Rate:  60 PR Interval:    QRS Duration: 88 QT Interval:  471 QTC Calculation: 471 R Axis:   12 Text Interpretation: Sinus rhythm Confirmed by Kristine Royal [phone]) on 09/02/2020 11:47:02 AM   Radiology DG Chest Port 1 View  Result Date: 09/02/2020 CLINICAL DATA:  Syncope EXAM: PORTABLE CHEST 1 VIEW COMPARISON:  01/01/2019 FINDINGS: Cardiomegaly. No confluent airspace opacities, effusions or edema. Aortic atherosclerosis. No acute bony abnormality. IMPRESSION: Cardiomegaly.  Aortic atherosclerosis. No active disease. Electronically Signed   By: Charlett Nose M.D.   On: 09/02/2020 11:57    Procedures Procedures   Medications Ordered in ED Medications - No data to display  ED Course  I have reviewed the triage vital signs and the nursing notes.  Pertinent labs & imaging results that were available during my care of the patient were reviewed by me and considered in my  medical decision making (see chart for details).    MDM Rules/Calculators/A&P                          MDM  Screen complete  CATRINIA RACICOT was evaluated in Emergency Department on 09/02/2020 for the symptoms described in the history of present illness. She was evaluated in the context of the global COVID-19 pandemic, which necessitated consideration that the patient might be at risk for infection with the SARS-CoV-2 virus that causes COVID-19. Institutional protocols and algorithms that pertain to the evaluation of patients at risk for COVID-19 are in a state of rapid change based on information released by regulatory bodies including the CDC and federal and state organizations. These policies and algorithms were followed during the patient's care in the ED.  Patient is presenting for evaluation following reported syncopal event.  Patient with out current complaint.  Routine labs are without significant abnormality.  Patient's son is at bedside.  Discussed indications for observation admission versus discharge.  Son would prefer admission.  Hospitalist service is aware of case and will evaluate for same.  Final Clinical Impression(s) / ED Diagnoses Final diagnoses:  Syncope, unspecified syncope type    Rx / DC Orders ED Discharge Orders    None       Wynetta Fines, MD 09/02/20 [phone]

## 2020-09-02 NOTE — ED Notes (Signed)
ED TO INPATIENT HANDOFF REPORT  ED Nurse Name and Phone #: Charise Carwin, RN [phone]   S Name/Age/Gender Lori Ellis 87 y.o. female Room/Bed: WA03/WA03  Code Status   Code Status: DNR  Home/SNF/Other Skilled nursing facility Patient oriented to: self and place Is this baseline? Yes   Triage Complete: Triage complete  Chief Complaint Syncope and collapse [R55]  Triage Note Pt BIB EMS from [facility] on [address]. Staff found her on toilet after she had a syncopal episode. Pt did not fall or have any injury. Pt had trouble controlling her bowels. A&O x4.   20G LFA 139/62 CBG 198     Allergies Allergies  Allergen Reactions  . Fentanyl Other (See Comments)    Severe delusions  Other reaction(s): Confusion (intolerance), Delusions (intolerance), Other (See Comments) Severe delusions  . Penicillins Hives and Rash    Did it involve swelling of the face/tongue/throat, SOB, or low BP? Unk Did it involve sudden or severe rash/hives, skin peeling, or any reaction on the inside of your mouth or nose? Unk Did you need to seek medical attention at a hospital or doctor's office? Unk When did it last happen? "A long time ago" If all above answers are "NO", may proceed with cephalosporin use.   . Tetanus Toxoid Hives  . Adhesive [Tape] Other (See Comments)    TEARS THE SKIN!!  . Advil [Ibuprofen] Other (See Comments)    Was told by MD to not take  . Hydrochlorothiazide Other (See Comments)    Hyponatremia   . Naproxen Sodium Rash    Level of Care/Admitting Diagnosis ED Disposition    ED Disposition Condition Comment   Admit  Hospital [facility] [[id number]]  Level of Care: Telemetry [5]  Admit to tele based on following criteria: Eval of Syncope  Covid Evaluation: Asymptomatic Screening Protocol (No Symptoms)  Diagnosis: Syncope and collapse [780.2.ICD-9-CM]  Admitting Physician: [place], ERIC J [[id number]]  Attending Physician: [place], ERIC J  [[id number]]       B Medical/Surgery History Past Medical History:  Diagnosis Date  . Degenerative disc disease, lumbar   . Diabetes mellitus without complication (HCC)   . Hypertension   . Renal disorder   . Thyroid disease    Past Surgical History:  Procedure Laterality Date  . BLADDER SURGERY    . CHOLECYSTECTOMY    . INTRAMEDULLARY (IM) NAIL INTERTROCHANTERIC Right 01/02/2019   Procedure: INTRAMEDULLARY (IM) NAIL INTERTROCHANTRIC right hip;  Surgeon: Myrene Galas, MD;  Location: [facility] OR;  Service: Orthopedics;  Laterality: Right;     A IV Location/Drains/Wounds Patient Lines/Drains/Airways Status    Active Line/Drains/Airways    Name Placement date Placement time Site Days   Peripheral IV 09/02/20 Left;Posterior Forearm 09/02/20  --  Forearm  less than 1   External Urinary Catheter 09/02/20  1146  --  less than 1          Intake/Output Last 24 hours No intake or output data in the 24 hours ending 09/02/20 1945  Labs/Imaging Results for orders placed or performed during the hospital encounter of 09/02/20 (from the past 48 hour(s))  Comprehensive metabolic panel     Status: Abnormal   Collection Time: 09/02/20 12:00 PM  Result Value Ref Range   Sodium 138 135 - 145 mmol/L   Potassium 5.7 (H) 3.5 - 5.1 mmol/L    Comment: SPECIMEN HEMOLYZED. HEMOLYSIS MAY AFFECT INTEGRITY OF RESULTS.   Chloride 106 98 - 111 mmol/L   CO2 24  22 - 32 mmol/L   Glucose, Bld 182 (H) 70 - 99 mg/dL    Comment: Glucose reference range applies only to samples taken after fasting for at least 8 hours.   BUN 32 (H) 8 - 23 mg/dL   Creatinine, Ser 0.451.38 (H) 0.44 - 1.00 mg/dL   Calcium 8.8 (L) 8.9 - 10.3 mg/dL   Total Protein 7.0 6.5 - 8.1 g/dL   Albumin 3.7 3.5 - 5.0 g/dL   AST 34 15 - 41 U/L   ALT 18 0 - 44 U/L   Alkaline Phosphatase 64 38 - 126 U/L   Total Bilirubin 0.9 0.3 - 1.2 mg/dL   GFR, Estimated 35 (L) >60 mL/min    Comment: (NOTE) Calculated using the CKD-EPI Creatinine Equation  (2021)    Anion gap 8 5 - 15    Comment: Performed at [facility], [address] [place], [place] [address]  CBC with Differential     Status: Abnormal   Collection Time: 09/02/20 12:00 PM  Result Value Ref Range   WBC 8.3 4.0 - 10.5 K/uL   RBC 3.70 (L) 3.87 - 5.11 MIL/uL   Hemoglobin 11.1 (L) 12.0 - 15.0 g/dL   HCT 19.135.6 (L) 47.836.0 - 29.546.0 %   MCV 96.2 80.0 - 100.0 fL   MCH 30.0 26.0 - 34.0 pg   MCHC 31.2 30.0 - 36.0 g/dL   RDW 62.114.9 30.811.5 - 65.715.5 %   Platelets 150 150 - 400 K/uL    Comment: SPECIMEN CHECKED FOR CLOTS REPEATED TO VERIFY PLATELET COUNT CONFIRMED BY SMEAR    nRBC 0.0 0.0 - 0.2 %   Neutrophils Relative % 63 %   Neutro Abs 5.3 1.7 - 7.7 K/uL   Lymphocytes Relative 23 %   Lymphs Abs 1.9 0.7 - 4.0 K/uL   Monocytes Relative 9 %   Monocytes Absolute 0.7 0.1 - 1.0 K/uL   Eosinophils Relative 4 %   Eosinophils Absolute 0.3 0.0 - 0.5 K/uL   Basophils Relative 1 %   Basophils Absolute 0.1 0.0 - 0.1 K/uL   Immature Granulocytes 0 %   Abs Immature Granulocytes 0.03 0.00 - 0.07 K/uL    Comment: Performed at [facility], [address] [place], [place] [address]  Troponin I (High Sensitivity)     Status: None   Collection Time: 09/02/20 12:00 PM  Result Value Ref Range   Troponin I (High Sensitivity) 11 <18 ng/L    Comment: (NOTE) Elevated high sensitivity troponin I (hsTnI) values and significant  changes across serial measurements may suggest ACS but many other  chronic and acute conditions are known to elevate hsTnI results.  Refer to the "Links" section for chest pain algorithms and additional  guidance. Performed at [facility], [address] [place], [place] [address]   SARS CORONAVIRUS 2 (TAT 6-24 HRS) Nasopharyngeal Nasopharyngeal Swab     Status: None   Collection Time: 09/02/20 12:00 PM   Specimen: Nasopharyngeal Swab  Result Value Ref Range   SARS Coronavirus 2 NEGATIVE NEGATIVE    Comment:  (NOTE) SARS-CoV-2 target nucleic acids are NOT DETECTED.  The SARS-CoV-2 RNA is generally detectable in upper and lower respiratory specimens during the acute phase of infection. Negative results do not preclude SARS-CoV-2 infection, do not rule out co-infections with other pathogens, and should not be used as the sole basis for treatment or other patient management decisions. Negative results must be combined with clinical observations, patient history, and  epidemiological information. The expected result is Negative.  Fact Sheet for Patients: HairSlick.no  Fact Sheet for Healthcare Providers: quierodirigir.com  This test is not yet approved or cleared by the [place] FDA and  has been authorized for detection and/or diagnosis of SARS-CoV-2 by FDA under an Emergency Use Authorization (EUA). This EUA will remain  in effect (meaning this test can be used) for the duration of the COVID-19 declaration under Se ction 564(b)(1) of the Act, 21 U.S.C. section 360bbb-3(b)(1), unless the authorization is terminated or revoked sooner.  Performed at [facility], [address] [place], [place] [address]   Troponin I (High Sensitivity)     Status: None   Collection Time: 09/02/20  1:42 PM  Result Value Ref Range   Troponin I (High Sensitivity) 11 <18 ng/L    Comment: (NOTE) Elevated high sensitivity troponin I (hsTnI) values and significant  changes across serial measurements may suggest ACS but many other  chronic and acute conditions are known to elevate hsTnI results.  Refer to the "Links" section for chest pain algorithms and additional  guidance. Performed at [facility], [address], [place], [place] [address]    DG Chest Port 1 View  Result Date: 09/02/2020 CLINICAL DATA:  Syncope EXAM: PORTABLE CHEST 1 VIEW COMPARISON:  01/01/2019 FINDINGS: Cardiomegaly. No confluent airspace opacities,  effusions or edema. Aortic atherosclerosis. No acute bony abnormality. IMPRESSION: Cardiomegaly.  Aortic atherosclerosis. No active disease. Electronically Signed   By: Charlett Nose M.D.   On: 09/02/2020 11:57    Pending Labs Unresulted Labs (From admission, onward)          Start     Ordered   09/09/20 0500  Creatinine, serum  (enoxaparin (LOVENOX)    CrCl >/= 30 ml/min)  Weekly,   R     Comments: while on enoxaparin therapy    09/02/20 1813   09/03/20 0500  Basic metabolic panel  Tomorrow morning,   R        09/02/20 1813   09/03/20 0500  CBC  Tomorrow morning,   R        09/02/20 1813   09/02/20 1813  Culture, Urine  Once,   STAT        09/02/20 1813   09/02/20 1813  Hemoglobin A1c  Once,   STAT       Comments: To assess prior glycemic control    09/02/20 1813   09/02/20 1813  CBC  (enoxaparin (LOVENOX)    CrCl >/= 30 ml/min)  Once,   STAT       Comments: Baseline for enoxaparin therapy IF NOT ALREADY DRAWN.  Notify MD if PLT < 100 K.    09/02/20 1813   09/02/20 1813  Creatinine, serum  (enoxaparin (LOVENOX)    CrCl >/= 30 ml/min)  Once,   STAT       Comments: Baseline for enoxaparin therapy IF NOT ALREADY DRAWN.    09/02/20 1813   09/02/20 1813  Potassium  Once,   STAT        09/02/20 1813   09/02/20 1813  TSH  Once,   STAT        09/02/20 1813   09/02/20 1142  Urinalysis, Routine w reflex microscopic Urine, Clean Catch  ONCE - STAT,   STAT        09/02/20 1141          Vitals/Pain Today's Vitals   09/02/20 1700 09/02/20 1730 09/02/20 1800  09/02/20 1830  BP: (!) 142/72 (!) 141/83 (!) 153/75 (!) 143/66  Pulse: 60 63 61 64  Resp: 17 16 12  (!) 24  Temp:      TempSrc:      SpO2: 100% 98% 99% 98%  PainSc:        Isolation Precautions No active isolations  Medications Medications  amLODipine (NORVASC) tablet 2.5 mg (has no administration in time range)  levothyroxine (SYNTHROID) tablet 75 mcg (has no administration in time range)  senna (SENOKOT) tablet 8.6  mg (has no administration in time range)  melatonin tablet 3 mg (has no administration in time range)  gabapentin (NEURONTIN) capsule 300 mg (has no administration in time range)  cholecalciferol (VITAMIN D3) tablet 1,000 Units (has no administration in time range)  ascorbic acid (VITAMIN C) tablet 500 mg (has no administration in time range)  insulin aspart (novoLOG) injection 0-5 Units (has no administration in time range)  insulin aspart (novoLOG) injection 0-6 Units (has no administration in time range)  enoxaparin (LOVENOX) injection 40 mg (has no administration in time range)  acetaminophen (TYLENOL) tablet 650 mg (has no administration in time range)    Or  acetaminophen (TYLENOL) suppository 650 mg (has no administration in time range)  ondansetron (ZOFRAN) tablet 4 mg (has no administration in time range)    Or  ondansetron (ZOFRAN) injection 4 mg (has no administration in time range)  amLODipine (NORVASC) tablet 5 mg (has no administration in time range)  propranolol (INDERAL) tablet 40 mg (has no administration in time range)  propranolol (INDERAL) tablet 20 mg (has no administration in time range)  lisinopril (ZESTRIL) tablet 5 mg (has no administration in time range)    Mobility non-ambulatory High fall risk   Focused Assessments:          Neuro Assessment: Within Defined Limits Neuro Checks:      R Recommendations: See Admitting Provider Note  Report given to:   Additional Notes:

## 2020-09-02 NOTE — Progress Notes (Signed)
Pt refusing care, and telemetry at this time. Provider made aware

## 2020-09-02 NOTE — ED Notes (Signed)
Call to floor pt nurse notified of need for recollect lavender top blood tube. Previous specimen clotted

## 2020-09-02 NOTE — H&P (Addendum)
History and Physical    Lori PateDoris B Ellis GEX:[id number] DOB: 12/07/1920 DOA: 09/02/2020  PCP: Truett PernaWang, Yun, MD  Patient coming from: [facility]  I have personally briefly reviewed patient's [facility]  Chief Complaint: Brought in by EMS for syncopal episode  HPI: Lori Ellis is a 87 y.o. female with medical history significant of essential hypertension, type 2 diabetes mellitus, peripheral neuropathy, spinal stenosis, chronic pain, hypothyroidism, vitamin D deficiency who presents from her skilled nursing facility after being found on the toilet unresponsive.  Patient does not recall events leading to syncopal episode, son at bedside assists with history and further history obtained from ED physician and EMS report.  Apparently patient was found on the toilet unresponsive, for unclear amount of time.  Currently patient alert and oriented in no specific complaints at this time.  Denies any prodrome of symptoms, no dizziness, no reported seizure-like activity, no tongue biting or apparent loss of bowel/bladder function.  She further denies any current dizziness, no fever/chills/night sweats, no cough/congestion, no visual disturbances, no chest pain, no palpitations, no shortness of breath, no abdominal pain, no weakness, no fatigue, no paresthesias no dysuria, no increased urinary frequency.  Patient and son reports that at baseline she utilizes a wheelchair for mobilization but is able to self transfer to bed/commode on her own.  ED Course: Temperature 98.0 F, HR 50, RR 12, BP 150/65, SPO2 100% on room air.  WC 8.3, hemoglobin 11.1, platelets 150.  Sodium 138, potassium 5.7 (hemolyzed specimen), chloride 106, CO2 26, BUN 32, creatinine 1.3, glucose 182.  Troponin 11>11.  Covid-19 test pending.  Chest x-ray with cardiomegaly otherwise no acute cardiopulmonary disease finding.  Hospital service consulted for further evaluation management of syncopal episode.  Review of  Systems: As per HPI otherwise 10 point review of systems negative.    Past Medical History:  Diagnosis Date  . Degenerative disc disease, lumbar   . Diabetes mellitus without complication (HCC)   . Hypertension   . Renal disorder   . Thyroid disease     Past Surgical History:  Procedure Laterality Date  . BLADDER SURGERY    . CHOLECYSTECTOMY    . INTRAMEDULLARY (IM) NAIL INTERTROCHANTERIC Right 01/02/2019   Procedure: INTRAMEDULLARY (IM) NAIL INTERTROCHANTRIC right hip;  Surgeon: Myrene GalasHandy, Michael, MD;  Location: [facility] OR;  Service: Orthopedics;  Laterality: Right;     reports that she has never smoked. She has never used smokeless tobacco. She reports that she does not drink alcohol. No history on file for drug use.  Allergies  Allergen Reactions  . Fentanyl Other (See Comments)    Severe delusions  Other reaction(s): Confusion (intolerance), Delusions (intolerance), Other (See Comments) Severe delusions  . Penicillins Hives and Rash    Did it involve swelling of the face/tongue/throat, SOB, or low BP? Unk Did it involve sudden or severe rash/hives, skin peeling, or any reaction on the inside of your mouth or nose? Unk Did you need to seek medical attention at a hospital or doctor's office? Unk When did it last happen? "A long time ago" If all above answers are "NO", may proceed with cephalosporin use.   . Tetanus Toxoid Hives  . Adhesive [Tape] Other (See Comments)    TEARS THE SKIN!!  . Advil [Ibuprofen] Other (See Comments)    Was told by MD to not take  . Hydrochlorothiazide Other (See Comments)    Hyponatremia   . Naproxen Sodium Rash    History reviewed.  No pertinent family history.  Family history reviewed and not pertinent   Prior to Admission medications   Medication Sig Start Date End Date Taking? Authorizing Provider  acetaminophen (TYLENOL) 325 MG tablet Take 2 tablets (650 mg total) by mouth every 6 (six) hours as needed for mild pain. Patient taking  differently: Take 650 mg by mouth in the morning and at bedtime. 06/21/17   Santos-Sanchez, Chelsea Primus, MD  amLODipine (NORVASC) 2.5 MG tablet Take 1 tablet (2.5 mg total) by mouth at bedtime. 01/10/19   Roberto Scales D, MD  amLODipine (NORVASC) 5 MG tablet Take 5 mg by mouth daily.    [provider]  cephALEXin (KEFLEX) 500 MG capsule Take 1 capsule (500 mg total) by mouth 3 (three) times daily. 07/27/20   Derwood Kaplan, MD  cholecalciferol (VITAMIN D) 25 MCG tablet Take 2 tablets (2,000 Units total) by mouth 2 (two) times daily. Patient taking differently: Take 1,000 Units by mouth daily. 01/03/19   Montez Morita, PA-C  gabapentin (NEURONTIN) 300 MG capsule Take 1 capsule (300 mg total) by mouth at bedtime. Patient taking differently: Take 300 mg by mouth 2 (two) times daily. 01/10/19   Laverna Peace, MD  levothyroxine (SYNTHROID) 75 MCG tablet Take 1 tablet (75 mcg total) by mouth daily before breakfast. 01/10/19   Roberto Scales D, MD  lisinopril (ZESTRIL) 5 MG tablet Take 5 mg by mouth daily.    [provider]  Melatonin 3 MG TABS Take 1 tablet (3 mg total) by mouth at bedtime as needed (sleep). 01/10/19   Laverna Peace, MD  propranolol (INDERAL) 20 MG tablet Take 1-2 tablets (20-40 mg total) by mouth See admin instructions. Take 40 mg by mouth in the morning and 20 mg at 4:30 PM 01/10/19   Roberto Scales D, MD  QUEtiapine (SEROQUEL) 25 MG tablet Take 1 tablet (25 mg total) by mouth every evening. Patient not taking: Reported on 07/27/2020 01/10/19   Laverna Peace, MD  senna (SENOKOT) 8.6 MG TABS tablet Take 1 tablet (8.6 mg total) by mouth 2 (two) times daily. Patient taking differently: Take 1 tablet by mouth daily as needed for mild constipation. 01/10/19   Laverna Peace, MD  traMADol (ULTRAM) 50 MG tablet Take 50 mg by mouth every 12 (twelve) hours as needed for moderate pain.    [provider]  vitamin C (VITAMIN C) 500 MG tablet Take 1 tablet (500 mg total) by  mouth daily. 01/04/19   Montez Morita, PA-C    Physical Exam: Vitals:   09/02/20 1345 09/02/20 1400 09/02/20 1415 09/02/20 1430  BP:  (!) 152/115  (!) 150/65  Pulse: (!) 58 63 (!) 53 (!) 50  Resp: 11 17 13 12   Temp:      TempSrc:      SpO2: 100% 99% 99% 100%    Constitutional: NAD, calm, comfortable, elderly in appearance Eyes: PERRL, lids and conjunctivae normal ENMT: Mucous membranes are moist. Posterior pharynx clear of any exudate or lesions.Normal dentition.  Neck: normal, supple, no masses, no thyromegaly Respiratory: clear to auscultation bilaterally, no wheezing, no crackles. Normal respiratory effort. No accessory muscle use.  Oxygenating well on room air Cardiovascular: Regular rate and rhythm, no murmurs / rubs / gallops. No extremity edema. 2+ pedal pulses. No carotid bruits.  Abdomen: no tenderness, no masses palpated. No hepatosplenomegaly. Bowel sounds positive.  Musculoskeletal: no clubbing / cyanosis. No joint deformity upper and lower extremities. Good ROM, no contractures. Normal muscle tone.  Skin: no rashes, lesions, ulcers. No induration Neurologic: CN 2-12 grossly intact. Sensation intact, DTR normal. Strength 5/5 in all 4.  Psychiatric: Normal judgment and insight. Alert and oriented x 3. Normal mood.    Labs on Admission: I have personally reviewed following labs and imaging studies  CBC: Recent Labs  Lab 09/02/20 1200  WBC 8.3  NEUTROABS 5.3  HGB 11.1*  HCT 35.6*  MCV 96.2  PLT 150   Basic Metabolic Panel: Recent Labs  Lab 09/02/20 1200  NA 138  K 5.7*  CL 106  CO2 24  GLUCOSE 182*  BUN 32*  CREATININE 1.38*  CALCIUM 8.8*   GFR: CrCl cannot be calculated (Unknown ideal weight.). Liver Function Tests: Recent Labs  Lab 09/02/20 1200  AST 34  ALT 18  ALKPHOS 64  BILITOT 0.9  PROT 7.0  ALBUMIN 3.7   No results for input(s): LIPASE, AMYLASE in the last 168 hours. No results for input(s): AMMONIA in the last 168 hours. Coagulation  Profile: No results for input(s): INR, PROTIME in the last 168 hours. Cardiac Enzymes: No results for input(s): CKTOTAL, CKMB, CKMBINDEX, TROPONINI in the last 168 hours. BNP (last 3 results) No results for input(s): PROBNP in the last 8760 hours. HbA1C: No results for input(s): HGBA1C in the last 72 hours. CBG: No results for input(s): GLUCAP in the last 168 hours. Lipid Profile: No results for input(s): CHOL, HDL, LDLCALC, TRIG, CHOLHDL, LDLDIRECT in the last 72 hours. Thyroid Function Tests: No results for input(s): TSH, T4TOTAL, FREET4, T3FREE, THYROIDAB in the last 72 hours. Anemia Panel: No results for input(s): VITAMINB12, FOLATE, FERRITIN, TIBC, IRON, RETICCTPCT in the last 72 hours. Urine analysis:    Component Value Date/Time   COLORURINE YELLOW 07/27/2020 2135   APPEARANCEUR CLOUDY (A) 07/27/2020 2135   LABSPEC 1.009 07/27/2020 2135   PHURINE 6.0 07/27/2020 2135   GLUCOSEU NEGATIVE 07/27/2020 2135   HGBUR NEGATIVE 07/27/2020 2135   BILIRUBINUR NEGATIVE 07/27/2020 2135   KETONESUR NEGATIVE 07/27/2020 2135   PROTEINUR 30 (A) 07/27/2020 2135   NITRITE POSITIVE (A) 07/27/2020 2135   LEUKOCYTESUR LARGE (A) 07/27/2020 2135    Radiological Exams on Admission: DG Chest Port 1 View  Result Date: 09/02/2020 CLINICAL DATA:  Syncope EXAM: PORTABLE CHEST 1 VIEW COMPARISON:  01/01/2019 FINDINGS: Cardiomegaly. No confluent airspace opacities, effusions or edema. Aortic atherosclerosis. No acute bony abnormality. IMPRESSION: Cardiomegaly.  Aortic atherosclerosis. No active disease. Electronically Signed   By: Charlett Nose M.D.   On: 09/02/2020 11:57    EKG: Independently reviewed.   Assessment/Plan Principal Problem:   Syncope and collapse Active Problems:   Spinal stenosis, lumbar   CKD (chronic kidney disease) stage 3, GFR 30-59 ml/min (HCC)   Chronic bilateral low back pain with bilateral sciatica   Hypothyroidism   Type II diabetes mellitus with renal manifestations  (HCC)   Benign essential HTN    Syncopal episode Patient presenting via EMS from SNF after being found unresponsive in bathroom while sitting on the toilet.  Patient does not recall events leading to her syncopal episode.  Denies any prodromal symptoms and no reported seizure-like activity.  Patient is afebrile without leukocytosis.  Patient is currently alert and oriented with no significant complaints.  EKG with normal sinus rhythm, normal QTC and no concerning ST elevation/depressions or T wave inversions.  Chest x-ray with no acute findings.  No significant murmurs appreciated on auscultation of heart sounds.  Suspect etiology of her event related to vasovagal syncope while straining on  the toilet. --Admit to observation --Monitor on telemetry --Check TSH --Orthostatic vital signs, only sitting/lying --PT/OT evaluation  Hyperkalemia Potassium 5.7, although specimen hemolyzed. --Repeat potassium level  Essential hypertension BP 150/65 and admission. --Amlodipine 5 mg qAm and 2.5mg  PO qPM --Lisinopril 5 mg p.o. daily --Propranolol 40mg  qAM and 20mg  qPM  Hypothyroidism --Levothyroxine 75 mcg p.o. daily  Type 2 diabetes mellitus Diet controlled at home. --Check hemoglobin A1c --Very sensitive SSI for coverage --CBGs before every meal/at bedtime  Chronic low back pain with sciatica Lumbar spinal stenosis --Tylenol as needed --Holding home tramadol  CKD stage IIIb Creatinine 1.38 on admission, at baseline. --Avoid nephrotoxins, renally dose all medications  Peripheral neuropathy --Gabapentin 300 mg p.o. twice daily  Vitamin D deficiency --Continue vitamin D supplementation  DVT prophylaxis: Lovenox Code Status: DNR Family Communication: Discussed with son present at bedside Disposition Plan: Anticipate discharge back to [facility] likely tomorrow Consults called: None Admission status: Observation Level of care: Telemetry   At the point of initial evaluation,  it is my clinical opinion that admission for OBSERVATION is reasonable and necessary because the patient's presenting complaints in the context of their chronic conditions represent sufficient risk of deterioration or significant morbidity to constitute reasonable grounds for close observation in the hospital setting, but that the patient may be medically stable for discharge from the hospital within 24 to 48 hours.    [facility] DO [facility] Available via Epic secure chat 7am-7pm After these hours, please refer to coverage provider listed on [email or web address] 09/02/2020, 4:06 PM

## 2020-09-02 NOTE — Progress Notes (Signed)
Pt pulling off heart monitor and trying to pull out IV,  yelling at staff "go to hell", threatening to "kick in the gut", yelling " i will not keep monitor on",  This nurse educated on importance of heart monitor AND iv. Provider has been made aware

## 2020-09-03 DIAGNOSIS — R55 Syncope and collapse: Secondary | ICD-10-CM | POA: Diagnosis not present

## 2020-09-03 DIAGNOSIS — N390 Urinary tract infection, site not specified: Secondary | ICD-10-CM | POA: Diagnosis present

## 2020-09-03 LAB — CBC
HCT: 39.9 % (ref 36.0–46.0)
Hemoglobin: 12.6 g/dL (ref 12.0–15.0)
MCH: 29.8 pg (ref 26.0–34.0)
MCHC: 31.6 g/dL (ref 30.0–36.0)
MCV: 94.3 fL (ref 80.0–100.0)
Platelets: 198 10*3/uL (ref 150–400)
RBC: 4.23 MIL/uL (ref 3.87–5.11)
RDW: 14.4 % (ref 11.5–15.5)
WBC: 9.9 10*3/uL (ref 4.0–10.5)
nRBC: 0 % (ref 0.0–0.2)

## 2020-09-03 LAB — BASIC METABOLIC PANEL
Anion gap: 11 (ref 5–15)
BUN: 24 mg/dL — ABNORMAL HIGH (ref 8–23)
CO2: 25 mmol/L (ref 22–32)
Calcium: 9.3 mg/dL (ref 8.9–10.3)
Chloride: 106 mmol/L (ref 98–111)
Creatinine, Ser: 1.07 mg/dL — ABNORMAL HIGH (ref 0.44–1.00)
GFR, Estimated: 47 mL/min — ABNORMAL LOW (ref >60)
Glucose, Bld: 138 mg/dL — ABNORMAL HIGH (ref 70–99)
Potassium: 3.7 mmol/L (ref 3.5–5.1)
Sodium: 142 mmol/L (ref 135–145)

## 2020-09-03 MED ORDER — CEFDINIR 300 MG PO CAPS: 300.0000 mg | ORAL_CAPSULE | Freq: Every day | ORAL | 0 refills | Status: DC

## 2020-09-03 MED ORDER — CEFDINIR 300 MG PO CAPS
300.0000 mg | ORAL_CAPSULE | Freq: Every day | ORAL | Status: DC
  Filled 2020-09-03: qty 1

## 2020-09-03 NOTE — Discharge Instructions (Signed)
Near-Syncope Near-syncope is when you suddenly get weak or dizzy, or you feel like you might pass out (faint). This may also be called presyncope. This is due to a lack of blood flow to the brain. During an episode of near-syncope, you may:  Feel dizzy, weak, or light-headed.  Feel sick to your stomach (nauseous).  See all white or all black.  See spots.  Have cold, clammy skin. This condition is caused by a sudden decrease in blood flow to the brain. This decrease can result from various causes, but most of those causes are not dangerous. However, near-syncope may be a sign of a serious medical problem, so it is important to seek medical care. Follow these instructions at home: Medicines  Take over-the-counter and prescription medicines only as told by your doctor.  If you are taking blood pressure or heart medicine, get up slowly and spend many minutes getting ready to sit and then stand. This can help with dizziness. General instructions  Be aware of any changes in your symptoms.  Talk with your doctor about your symptoms. You may need to have testing to find the cause of your near-syncope.  If you start to feel like you might pass out, lie down right away. Raise (elevate) your feet above the level of your heart. Breathe deeply and steadily. Wait until all of the symptoms are gone.  Have someone stay with you until you feel stable.  Do not drive, use machinery, or play sports until your doctor says it is okay.  Drink enough fluid to keep your pee (urine) pale yellow.  Keep all follow-up visits as told by your doctor. This is important. Get help right away if you:  Have a seizure.  Have pain in your: ? Chest. ? Belly (abdomen). ? Back.  Faint once or more than once.  Have a very bad headache.  Are bleeding from your mouth or butt.  Have black or tarry poop (stool).  Have a very fast or uneven heartbeat (palpitations).  Are mixed up (confused).  Have trouble  walking.  Are very weak.  Have trouble seeing. These symptoms may be an emergency. Do not wait to see if the symptoms will go away. Get medical help right away. Call your local emergency services (911 in the [place] Do not drive yourself to the hospital. Summary  Near-syncope is when you suddenly get weak or dizzy, or you feel like you might pass out (faint).  This condition is caused by a lack of blood flow to the brain.  Near-syncope may be a sign of a serious medical problem, so it is important to seek medical care. This information is not intended to replace advice given to you by your health care provider. Make sure you discuss any questions you have with your health care provider. Document Revised: 09/27/2018 Document Reviewed: 04/24/2018 Elsevier Patient Education  2021 Elsevier Inc. Syncope Syncope is when you pass out (faint) for a short time. It is caused by a sudden decrease in blood flow to the brain. Signs that you may be about to pass out include:  Feeling dizzy or light-headed.  Feeling sick to your stomach (nauseous).  Seeing all white or all black.  Having cold, clammy skin. If you pass out, get help right away. Call your local emergency services (911 in the [place] Do not drive yourself to the hospital. Follow these instructions at home: Watch for any changes in your symptoms. Take these actions to stay safe and help with   your symptoms: Lifestyle  Do not drive, use machinery, or play sports until your doctor says it is okay.  Do not drink alcohol.  Do not use any products that contain nicotine or tobacco, such as cigarettes and e-cigarettes. If you need help quitting, ask your doctor.  Drink enough fluid to keep your pee (urine) pale yellow. General instructions  Take over-the-counter and prescription medicines only as told by your doctor.  If you are taking blood pressure or heart medicine, sit up and stand up slowly. Spend a few minutes getting ready to  sit and then stand. This can help you feel less dizzy.  Have someone stay with you until you feel stable.  If you start to feel like you might pass out, lie down right away and raise (elevate) your feet above the level of your heart. Breathe deeply and steadily. Wait until all of the symptoms are gone.  Keep all follow-up visits as told by your doctor. This is important. Get help right away if:  You have a very bad headache.  You pass out once or more than once.  You have pain in your chest, belly, or back.  You have a very fast or uneven heartbeat (palpitations).  It hurts to breathe.  You are bleeding from your mouth or your bottom (rectum).  You have black or tarry poop (stool).  You have jerky movements that you cannot control (seizure).  You are confused.  You have trouble walking.  You are very weak.  You have vision problems. These symptoms may be an emergency. Do not wait to see if the symptoms will go away. Get medical help right away. Call your local emergency services (911 in the [place] Do not drive yourself to the hospital. Summary  Syncope is when you pass out (faint) for a short time. It is caused by a sudden decrease in blood flow to the brain.  Signs that you may be about to faint include feeling dizzy, light-headed, or sick to your stomach, seeing all white or all black, or having cold, clammy skin.  If you start to feel like you might pass out, lie down right away and raise (elevate) your feet above the level of your heart. Breathe deeply and steadily. Wait until all of the symptoms are gone. This information is not intended to replace advice given to you by your health care provider. Make sure you discuss any questions you have with your health care provider. Document Revised: 07/16/2019 Document Reviewed: 07/18/2017 Elsevier Patient Education  2021 Elsevier Inc.  

## 2020-09-03 NOTE — Progress Notes (Signed)
Bed alarm going off. Sitting on the side of the bed, patient, yelling, " help," and " get the hell out of here." Explained that it is a safety issue we don't want you to fall, patient yells " get the hell out here." In addition to yelling patient, grabs wash cloth, picks it up and throws at nurse. Educated on the importance of safety and respecting staff.

## 2020-09-03 NOTE — Evaluation (Signed)
Occupational Therapy Evaluation Patient Details Name: Lori Ellis MRN: [id number] DOB: [birth year] Today's Date: 09/03/2020    History of Present Illness Lori Ellis is a 87 y.o. female with medical history significant of essential hypertension, type 2 diabetes mellitus, peripheral neuropathy, spinal stenosis, chronic pain, hypothyroidism, vitamin D deficiency who presents from her skilled nursing facility after being found on the toilet unresponsive.   Clinical Impression   Lori Ellis is a 87 year old woman from [place] nursing home who is typically able to perform transfers into wheelchair and assist with ADLs including toileting. Patient very agitated and therapist in room initially to assist RN with keeping patient safe. Patient exhibits functional ROM of all extremities and functional strength in upper extremities. She is confused, swinging at staff and yelling "Let me out of here." Patient able to roll independently left and right and grasp bed rails to attempt to pull herself up into sitting. To attempt to calm the patient therapist assisted patient into sitting at side of bed with mod x 2 for safety. Patient quickly became quiet and calm and appeared to be sleeping - assuming patient had significantly fatigued herself. Patient returned to supine and settled into bed. Patient total assist for all tasks at this time due to agitated and unsafe behaviors. Patient will benefit from skilled OT services while in hospital in order to improve safety, functional abilities and return to baseline in order to return to PLOF.     Follow Up Recommendations  SNF    Equipment Recommendations  None recommended by OT    Recommendations for Other Services       Precautions / Restrictions Precautions Precautions: Fall Precaution Comments: aggressive, confused, cursing Restrictions Weight Bearing Restrictions: No      Mobility Bed Mobility Overal bed mobility: Needs Assistance Bed  Mobility: Rolling;Supine to Sit;Sit to Supine Rolling: Modified independent (Device/Increase time)   Supine to sit: Mod assist;+2 for physical assistance;+2 for safety/equipment Sit to supine: Mod assist;+2 for physical assistance;+2 for safety/equipment   General bed mobility comments: Patient able to roll right and left as demonstrated during her agitated spell. Patient able to partially lift trunk up when attempting to crawl out of bed. When assisted to sitting, to attempt to improve agitation as she wanted out of bed, patient mod x 2. Patient became very quiet and appeared to be near sleep and not performing any movement or opening her eyes. Patient returned to supine and settled ni bed.    Transfers                 General transfer comment: Ambulation, standing unsafe to attempt.    Balance Overall balance assessment: Needs assistance Sitting-balance support: No upper extremity supported   Sitting balance - Comments: needed external assistance to maintain upright. Postural control: Posterior lean                                 ADL either performed or assessed with clinical judgement   ADL                                         General ADL Comments: Total assist at this time due to patient being so agitated, flailing limbs and non-participatory.     Vision   Vision Assessment?: No apparent visual deficits  Perception     Praxis      Pertinent Vitals/Pain Pain Assessment: Faces Faces Pain Scale: No hurt     Hand Dominance Right   Extremity/Trunk Assessment Upper Extremity Assessment Upper Extremity Assessment:  (WFL ROM and strength)   Lower Extremity Assessment Lower Extremity Assessment:  (WFL ROM, patient unable to follow commands)       Communication     Cognition Arousal/Alertness: Awake/alert Behavior During Therapy: Agitated;Impulsive;Restless Overall Cognitive Status: Impaired/Different from baseline                                  General Comments: Patient very agitated. Baseline cognition unable to be assessed.   General Comments       Exercises     Shoulder Instructions      Home Living Family/patient expects to be discharged to:: Skilled nursing facility                                 Additional Comments: [facility]      Prior Functioning/Environment          Comments: per chart review patient able to transfer self to wc and assist with ADLs        OT Problem List: Impaired balance (sitting and/or standing);Decreased activity tolerance;Decreased safety awareness;Decreased cognition      OT Treatment/Interventions: Self-care/ADL training;Therapeutic exercise;DME and/or AE instruction;Therapeutic activities;Cognitive remediation/compensation;Balance training;Patient/family education    OT Goals(Current goals can be found in the care plan section) Acute Rehab OT Goals OT Goal Formulation: Patient unable to participate in goal setting Time For Goal Achievement: 09/17/20 Potential to Achieve Goals: Fair  OT Frequency: Min 2X/week   Barriers to D/C:            Co-evaluation              AM-PAC OT "6 Clicks" Daily Activity     Outcome Measure Help from another person eating meals?: Total Help from another person taking care of personal grooming?: Total Help from another person toileting, which includes using toliet, bedpan, or urinal?: Total Help from another person bathing (including washing, rinsing, drying)?: Total Help from another person to put on and taking off regular upper body clothing?: Total Help from another person to put on and taking off regular lower body clothing?: Total 6 Click Score: 6   End of Session Nurse Communication: Mobility status  Activity Tolerance: Treatment limited secondary to agitation Patient left: in bed;with call bell/phone within reach;with bed alarm set  OT Visit Diagnosis: Other symptoms  and signs involving cognitive function;Adult, failure to thrive (R62.7)                Time: 8527-7824 OT Time Calculation (min): 13 min Charges:  OT General Charges $OT Visit: 1 Visit OT Evaluation $OT Eval Low Complexity: 1 Low  Sadako Cegielski, OTR/L Acute Care Rehab Services  Office [phone] Pager: [phone]   Kelli Churn 09/03/2020, 1:01 PM

## 2020-09-03 NOTE — Progress Notes (Signed)
Patient refused morning medications, assessment, morning glucose check, morning vital signs.

## 2020-09-03 NOTE — NC FL2 (Signed)
[place] MEDICAID FL2 LEVEL OF CARE SCREENING TOOL     IDENTIFICATION  Patient Name: Lori Ellis Birthdate: 08/26/1920 Sex: female Admission Date (Current Location): 09/02/2020  [facility] and [place] Number:  Producer, television/film/video and Address:         Provider Number: [id number]  Attending Physician Name and Address:  [place], Alvira Philips, DO  Relative Name and Phone Number:       Current Level of Care: Hospital Recommended Level of Care: Assisted Living Facility Prior Approval Number:    Date Approved/Denied:   PASRR Number:    Discharge Plan: Other (Comment) (Return back to [facility])    Current Diagnoses: Patient Active Problem List   Diagnosis Date Noted  . Acute lower UTI 09/03/2020  . Lethargy 01/08/2019  . Closed intertrochanteric fracture, right, initial encounter (HCC) 01/01/2019  . Atrial flutter by electrocardiogram (HCC) 01/01/2019  . Benign essential HTN   . Spinal stenosis, lumbar 06/12/2017  . Chronic bilateral low back pain with bilateral sciatica 03/07/2016  . Spondylolisthesis of lumbar region 03/07/2016  . CKD (chronic kidney disease) stage 3, GFR 30-59 ml/min (HCC) 03/12/2014  . Hypothyroidism 09/02/2013  . Type II diabetes mellitus with renal manifestations (HCC) 09/02/2013  . DDD (degenerative disc disease), lumbosacral 09/01/2013    Orientation RESPIRATION BLADDER Height & Weight     Self,Time,Situation,Place  Normal Continent Weight: 58.5 kg Height:  5\' 3"  (160 cm)  BEHAVIORAL SYMPTOMS/MOOD NEUROLOGICAL BOWEL NUTRITION STATUS      Continent Diet (Heart Healthy)  AMBULATORY STATUS COMMUNICATION OF NEEDS Skin   Total Care (w/c bound) Verbally Normal                       Personal Care Assistance Level of Assistance  Bathing,Feeding,Dressing Bathing Assistance: Maximum assistance Feeding assistance: Limited assistance Dressing Assistance: Maximum assistance     Functional Limitations Info  Sight,Hearing,Speech  Sight Info: Impaired Hearing Info: Adequate Speech Info: Adequate    SPECIAL CARE FACTORS FREQUENCY                       Contractures Contractures Info: Not present    Additional Factors Info                  Current Medications (09/03/2020):  This is the current hospital active medication list Current Facility-Administered Medications  Medication Dose Route Frequency Provider Last Rate Last Admin  . acetaminophen (TYLENOL) tablet 650 mg  650 mg Oral Q6H PRN 09/05/2020, [place], DO       Or  . acetaminophen (TYLENOL) suppository 650 mg  650 mg Rectal Q6H PRN Alvira Philips, Eric J, DO      . amLODipine (NORVASC) tablet 2.5 mg  2.5 mg Oral QHS 02-26-1976, Eric J, DO      . amLODipine (NORVASC) tablet 5 mg  5 mg Oral Daily 04-13-1977, [facility]      . ascorbic acid (VITAMIN C) tablet 500 mg  500 mg Oral Daily [facility], Eric J, DO      . cefdinir (OMNICEF) capsule 300 mg  300 mg Oral Daily 02-26-1976, Eric J, DO      . cholecalciferol (VITAMIN D3) tablet 1,000 Units  1,000 Units Oral Daily 02-26-1976, Eric J, DO      . enoxaparin (LOVENOX) injection 30 mg  30 mg Subcutaneous Q24H 02-26-1976, Eric J, DO      . gabapentin (NEURONTIN) capsule 300 mg  300 mg Oral BID  [place], Alvira Philips, DO      . insulin aspart (novoLOG) injection 0-5 Units  0-5 Units Subcutaneous QHS [place], Eric J, DO      . insulin aspart (novoLOG) injection 0-6 Units  0-6 Units Subcutaneous TID WC [place], Eric J, DO      . levothyroxine (SYNTHROID) tablet 75 mcg  75 mcg Oral QAC breakfast [place], Eric J, DO      . lisinopril (ZESTRIL) tablet 5 mg  5 mg Oral Daily Keturah Barre M, RPH      . melatonin tablet 3 mg  3 mg Oral QHS PRN [place], Eric J, DO      . ondansetron [facility]) tablet 4 mg  4 mg Oral Q6H PRN [place], Eric J, DO       Or  . ondansetron [facility]) injection 4 mg  4 mg Intravenous Q6H PRN [place], Eric J, DO      . propranolol (INDERAL) tablet 20 mg  20 mg Oral Q24H Cindi Carbon, [facility]      . propranolol (INDERAL)  tablet 40 mg  40 mg Oral Daily Cindi Carbon, [facility]      . senna (SENOKOT) tablet 8.6 mg  1 tablet Oral Daily PRN [place], Eric J, DO         Discharge Medications: Please see discharge summary for a list of discharge medications.  Relevant Imaging Results:  Relevant Lab Results:   Additional Information SS#[id number], Olegario Messier, [place]

## 2020-09-03 NOTE — Progress Notes (Signed)
Discharge instructions explained to son Alisi Lupien. Report given to [employer] at  [facility]. Made aware of patient, neurological status being alert but agitated. No other needs identified at this time.

## 2020-09-03 NOTE — Discharge Summary (Addendum)
Physician Discharge Summary  Lori PateDoris B Ellis WUJ:[id number] DOB: 12/18/1920 DOA: 09/02/2020  PCP: Truett PernaWang, Yun, MD  Admit date: 09/02/2020 Discharge date: 09/03/2020  Admitted From: [facility] Disposition:   [facility]  Recommendations for Outpatient Follow-up:  1. Follow up with PCP in 1-2 weeks 2. Continue cefdinir 300 mg p.o. daily x5 days for UTI 3. Please obtain BMP in one week 4. Please follow up on the following pending results: Urine culture which is pending at time of discharge 5. Consider discontinuation of tramadol given her advanced age.  Discharge Condition: Stable CODE STATUS: DNR Diet recommendation: Heart healthy/consistent carbohydrate diet  History of present illness:  Lori PateDoris B Fana is a 87 y.o. female with medical history significant of essential hypertension, type 2 diabetes mellitus, peripheral neuropathy, spinal stenosis, chronic pain, hypothyroidism, vitamin D deficiency who presents from her skilled nursing facility after being found on the toilet unresponsive.  Patient does not recall events leading to syncopal episode, son at bedside assists with history and further history obtained from ED physician and EMS report.  Apparently patient was found on the toilet unresponsive, for unclear amount of time.  Currently patient alert and oriented in no specific complaints at this time.  Denies any prodrome of symptoms, no dizziness, no reported seizure-like activity, no tongue biting or apparent loss of bowel/bladder function.  She further denies any current dizziness, no fever/chills/night sweats, no cough/congestion, no visual disturbances, no chest pain, no palpitations, no shortness of breath, no abdominal pain, no weakness, no fatigue, no paresthesias no dysuria, no increased urinary frequency.  Patient and son reports that at baseline she utilizes a wheelchair for mobilization but is able to self transfer to bed/commode on her own.  In the ED, Temperature 98.0 F,  HR 50, RR 12, BP 150/65, SPO2 100% on room air.  WC 8.3, hemoglobin 11.1, platelets 150.  Sodium 138, potassium 5.7 (hemolyzed specimen), chloride 106, CO2 26, BUN 32, creatinine 1.3, glucose 182.  Troponin 11>11.  Covid-19 test pending.  Chest x-ray with cardiomegaly otherwise no acute cardiopulmonary disease finding.  Hospital service consulted for further evaluation management of syncopal episode.  Hospital course:  Vasovagal syncope  Patient presenting via EMS from SNF after being found unresponsive in bathroom while sitting on the toilet.  Patient does not recall events leading to her syncopal episode.  Denies any prodromal symptoms and no reported seizure-like activity.  Patient is afebrile without leukocytosis.  Patient is currently alert and oriented with no significant complaints.  EKG with normal sinus rhythm, normal QTC and no concerning ST elevation/depressions or T wave inversions.  Chest x-ray with no acute findings.  No significant murmurs appreciated on auscultation of heart sounds.  Suspect etiology of her event related to vasovagal syncope while straining on the toilet.  TSH within normal limits, patient would not allow continuous telemetry monitoring while inpatient.  Discharging back to SNF today.  Also consider discontinuation of tramadol given her advanced age.  Urinary tract infection Urinalysis with small leukoesterase, positive nitrite, rare bacteria and 6-10 WBCs.  Review of previous urine cultures notable for E. coli with pan sensitivity.  Recently was on Keflex.  Continue cefdinir 300 mg p.o. daily x5 days.  Follow-up urine culture that was pending at time of discharge.  Hyperkalemia secondary to hemolyzed lab sample Potassium 5.7, although specimen hemolyzed.  Repeat potassium within normal limits.  Essential hypertension Continue amlodipine 5 mg qAm and 2.5mg  PO qPM,  Lisinopril 5 mg p.o. daily, Propranolol 40mg  qAM and 20mg   qPM  Hypothyroidism TSH 2.819.   Levothyroxine 75 mcg p.o. daily  Type 2 diabetes mellitus Diet controlled at home.  Hemoglobin A1c 6.6.  Chronic low back pain with sciatica Lumbar spinal stenosis Tylenol as needed; tramadol held while inpatient, consider discontinuing this medication given her advanced age.  CKD stage IIIb Creatinine 1.38 on admission, at baseline.  Peripheral neuropathy Gabapentin 300 mg p.o. twice daily  Vitamin D deficiency Continue vitamin D supplementation  Discharge Diagnoses:  Active Problems:   Spinal stenosis, lumbar   CKD (chronic kidney disease) stage 3, GFR 30-59 ml/min (HCC)   Chronic bilateral low back pain with bilateral sciatica   Hypothyroidism   Type II diabetes mellitus with renal manifestations (HCC)   Benign essential HTN   Acute lower UTI    Discharge Instructions  Discharge Instructions    Diet - low sodium heart healthy   Complete by: As directed    Increase activity slowly   Complete by: As directed      Allergies as of 09/03/2020      Reactions   Fentanyl Other (See Comments)   Severe delusions Other reaction(s): Confusion (intolerance), Delusions (intolerance), Other (See Comments) Severe delusions   Penicillins Hives, Rash   Did it involve swelling of the face/tongue/throat, SOB, or low BP? Unk Did it involve sudden or severe rash/hives, skin peeling, or any reaction on the inside of your mouth or nose? Unk Did you need to seek medical attention at a hospital or doctor's office? Unk When did it last happen? "A long time ago" If all above answers are "NO", may proceed with cephalosporin use.   Tetanus Toxoid Hives   Adhesive [tape] Other (See Comments)   TEARS THE SKIN!!   Advil [ibuprofen] Other (See Comments)   Was told by MD to not take   Hydrochlorothiazide Other (See Comments)   Hyponatremia    Naproxen Sodium Rash      Medication List    TAKE these medications   acetaminophen 325 MG tablet Commonly known as: TYLENOL Take 2  tablets (650 mg total) by mouth every 6 (six) hours as needed for mild pain. What changed:   how much to take  when to take this   amLODipine 5 MG tablet Commonly known as: NORVASC Take 5 mg by mouth daily.   amLODipine 2.5 MG tablet Commonly known as: NORVASC Take 1 tablet (2.5 mg total) by mouth at bedtime.   ascorbic acid 500 MG tablet Commonly known as: VITAMIN C Take 1 tablet (500 mg total) by mouth daily.   cefdinir 300 MG capsule Commonly known as: OMNICEF Take 1 capsule (300 mg total) by mouth daily for 5 days. Start taking on: September 04, 2020   gabapentin 300 MG capsule Commonly known as: NEURONTIN Take 1 capsule (300 mg total) by mouth at bedtime. What changed: when to take this   levothyroxine 75 MCG tablet Commonly known as: SYNTHROID Take 1 tablet (75 mcg total) by mouth daily before breakfast.   lisinopril 5 MG tablet Commonly known as: ZESTRIL Take 5 mg by mouth daily.   melatonin 3 MG Tabs tablet Take 1 tablet (3 mg total) by mouth at bedtime as needed (sleep). What changed: when to take this   propranolol 20 MG tablet Commonly known as: INDERAL Take 1-2 tablets (20-40 mg total) by mouth See admin instructions. Take 40 mg by mouth in the morning and 20 mg at 4:30 PM   senna 8.6 MG Tabs tablet Commonly known as: [employer]  Take 1 tablet (8.6 mg total) by mouth 2 (two) times daily. What changed:   when to take this  reasons to take this   traMADol 50 MG tablet Commonly known as: ULTRAM Take 50 mg by mouth every 12 (twelve) hours as needed for moderate pain.   Vitamin D3 50 MCG (2000 UT) Tabs Take 1 tablet by mouth daily.       Follow-up Information    Truett Perna, MD. Schedule an appointment as soon as possible for a visit in 1 week(s).   Specialty: Internal Medicine Contact information: [address] [place] [address] [phone]              Allergies  Allergen Reactions  . Fentanyl Other (See Comments)     Severe delusions  Other reaction(s): Confusion (intolerance), Delusions (intolerance), Other (See Comments) Severe delusions  . Penicillins Hives and Rash    Did it involve swelling of the face/tongue/throat, SOB, or low BP? Unk Did it involve sudden or severe rash/hives, skin peeling, or any reaction on the inside of your mouth or nose? Unk Did you need to seek medical attention at a hospital or doctor's office? Unk When did it last happen? "A long time ago" If all above answers are "NO", may proceed with cephalosporin use.   . Tetanus Toxoid Hives  . Adhesive [Tape] Other (See Comments)    TEARS THE SKIN!!  . Advil [Ibuprofen] Other (See Comments)    Was told by MD to not take  . Hydrochlorothiazide Other (See Comments)    Hyponatremia   . Naproxen Sodium Rash    Consultations:  None   Procedures/Studies: DG Chest Port 1 View  Result Date: 09/02/2020 CLINICAL DATA:  Syncope EXAM: PORTABLE CHEST 1 VIEW COMPARISON:  01/01/2019 FINDINGS: Cardiomegaly. No confluent airspace opacities, effusions or edema. Aortic atherosclerosis. No acute bony abnormality. IMPRESSION: Cardiomegaly.  Aortic atherosclerosis. No active disease. Electronically Signed   By: Charlett Nose M.D.   On: 09/02/2020 11:57     Subjective: Patient seen and examined bedside, agitated.  Requesting discharge back to "home".  Refusing telemetry and further examinations, assessment.  Discussed with patient's son [name], plan to discharge back to SNF today.  Patient with no other complaints.  No acute concerns overnight per nursing staff other than agitation.  Discharge Exam: Vitals:   09/02/20 2022 09/03/20 0439  BP:  125/62  Pulse: 60 93  Resp: 18 20  Temp:  98 F (36.7 C)  SpO2: 99% 94%   Vitals:   09/02/20 1900 09/02/20 1930 09/02/20 2022 09/03/20 0439  BP: (!) 141/64 (!) 144/68 (!) 155/49 125/62  Pulse: 63 68 60 93  Resp: Temp:  97.9 F (36.6 C) 98.4 F (36.9 C) 98 F (36.7 C)  TempSrc:   Oral Oral   SpO2: 99% 100% 99% 94%  Weight:   58.5 kg   Height:    (1.6 m)     General: Pt is alert, awake, slightly agitated and wanting to discharge home Cardiovascular: RRR, S1/S2 +, no rubs, no gallops Respiratory: CTA bilaterally, no wheezing, no rhonchi, oxygenating well on room air Abdominal: Soft, NT, ND, bowel sounds + Extremities: no edema, no cyanosis    The results of significant diagnostics from this hospitalization (including imaging, microbiology, ancillary and laboratory) are listed below for reference.     Microbiology: Recent Results (from the past 240 hour(s))  SARS CORONAVIRUS 2 (TAT 6-24 HRS) Nasopharyngeal Nasopharyngeal Swab  Status: None   Collection Time: 09/02/20 12:00 PM   Specimen: Nasopharyngeal Swab  Result Value Ref Range Status   SARS Coronavirus 2 NEGATIVE NEGATIVE Final    Comment: (NOTE) SARS-CoV-2 target nucleic acids are NOT DETECTED.  The SARS-CoV-2 RNA is generally detectable in upper and lower respiratory specimens during the acute phase of infection. Negative results do not preclude SARS-CoV-2 infection, do not rule out co-infections with other pathogens, and should not be used as the sole basis for treatment or other patient management decisions. Negative results must be combined with clinical observations, patient history, and epidemiological information. The expected result is Negative.  Fact Sheet for Patients: HairSlick.no  Fact Sheet for Healthcare Providers: quierodirigir.com  This test is not yet approved or cleared by the [place] FDA and  has been authorized for detection and/or diagnosis of SARS-CoV-2 by FDA under an Emergency Use Authorization (EUA). This EUA will remain  in effect (meaning this test can be used) for the duration of the COVID-19 declaration under Se ction 564(b)(1) of the Act, 21 U.S.C. section 360bbb-3(b)(1), unless the authorization  is terminated or revoked sooner.  Performed at [facility], [address] [place], [place] [address]      Labs: BNP (last 3 results) No results for input(s): BNP in the last 8760 hours. Basic Metabolic Panel: Recent Labs  Lab 09/02/20 1200 09/02/20 1915 09/03/20 0836  NA 138  --  142  K 5.7* 4.2 3.7  CL 106  --  106  CO2 24  --  25  GLUCOSE 182*  --  138*  BUN 32*  --  24*  CREATININE 1.38* 1.06* 1.07*  CALCIUM 8.8*  --  9.3   Liver Function Tests: Recent Labs  Lab 09/02/20 1200  AST 34  ALT 18  ALKPHOS 64  BILITOT 0.9  PROT 7.0  ALBUMIN 3.7   No results for input(s): LIPASE, AMYLASE in the last 168 hours. No results for input(s): AMMONIA in the last 168 hours. CBC: Recent Labs  Lab 09/02/20 1200 09/02/20 2031 09/03/20 0836  WBC 8.3 10.2 9.9  NEUTROABS 5.3  --   --   HGB 11.1* 12.0 12.6  HCT 35.6* 37.9 39.9  MCV 96.2 95.2 94.3  PLT 150 208 198   Cardiac Enzymes: No results for input(s): CKTOTAL, CKMB, CKMBINDEX, TROPONINI in the last 168 hours. BNP: Invalid input(s): POCBNP CBG: No results for input(s): GLUCAP in the last 168 hours. D-Dimer No results for input(s): DDIMER in the last 72 hours. Hgb A1c Recent Labs    09/02/20 1915  HGBA1C 6.6*   Lipid Profile No results for input(s): CHOL, HDL, LDLCALC, TRIG, CHOLHDL, LDLDIRECT in the last 72 hours. Thyroid function studies Recent Labs    09/02/20 1915  TSH 2.819   Anemia work up No results for input(s): VITAMINB12, FOLATE, FERRITIN, TIBC, IRON, RETICCTPCT in the last 72 hours. Urinalysis    Component Value Date/Time   COLORURINE YELLOW 09/02/2020 1914   APPEARANCEUR CLEAR 09/02/2020 1914   LABSPEC 1.009 09/02/2020 1914   PHURINE 6.0 09/02/2020 1914   GLUCOSEU NEGATIVE 09/02/2020 1914   HGBUR NEGATIVE 09/02/2020 1914   BILIRUBINUR NEGATIVE 09/02/2020 1914   KETONESUR NEGATIVE 09/02/2020 1914   PROTEINUR 30 (A) 09/02/2020 1914   NITRITE POSITIVE (A) 09/02/2020 1914    LEUKOCYTESUR SMALL (A) 09/02/2020 1914   Sepsis Labs Invalid input(s): PROCALCITONIN,  WBC,  LACTICIDVEN Microbiology Recent Results (from the past 240 hour(s))  SARS CORONAVIRUS 2 (TAT 6-24 HRS)  Nasopharyngeal Nasopharyngeal Swab     Status: None   Collection Time: 09/02/20 12:00 PM   Specimen: Nasopharyngeal Swab  Result Value Ref Range Status   SARS Coronavirus 2 NEGATIVE NEGATIVE Final    Comment: (NOTE) SARS-CoV-2 target nucleic acids are NOT DETECTED.  The SARS-CoV-2 RNA is generally detectable in upper and lower respiratory specimens during the acute phase of infection. Negative results do not preclude SARS-CoV-2 infection, do not rule out co-infections with other pathogens, and should not be used as the sole basis for treatment or other patient management decisions. Negative results must be combined with clinical observations, patient history, and epidemiological information. The expected result is Negative.  Fact Sheet for Patients: HairSlick.no  Fact Sheet for Healthcare Providers: quierodirigir.com  This test is not yet approved or cleared by the [place] FDA and  has been authorized for detection and/or diagnosis of SARS-CoV-2 by FDA under an Emergency Use Authorization (EUA). This EUA will remain  in effect (meaning this test can be used) for the duration of the COVID-19 declaration under Se ction 564(b)(1) of the Act, 21 U.S.C. section 360bbb-3(b)(1), unless the authorization is terminated or revoked sooner.  Performed at [facility], [address] [place], [place] [address]      Time coordinating discharge: Over 30 minutes  SIGNED:   Alvira Philips [place], DO  [facility] 09/03/2020, 10:55 AM

## 2020-09-03 NOTE — TOC Transition Note (Addendum)
Transition of Care [facility]) - CM/SW Discharge Note   Patient Details  Name: Lori Ellis MRN: [id number] Date of Birth: [birth year]  [facility] CM/SW Contact:  Lanier Clam, RN Phone Number: 09/03/2020, 10:50 AM   Clinical Narrative:  Spoke to son [name]-d/c plan to return back to ALF [facility] [address]-left message w/[facility];fl2 to be sent-await return call. [name] will transport back  11:27a-Spoke to [employer] rep Alice-can accept back-faxed w/confirmation signed fl2,d/c summary. [name] son will transport back. No further CM needs.     Final next level of care: Assisted Living Barriers to Discharge: No Barriers Identified   Patient Goals and CMS Choice Patient states their goals for this hospitalization and ongoing recovery are:: return back to [facility] CMS Medicare.gov Compare Post Acute Care list provided to:: Patient Represenative (must comment) Choice offered to / list presented to : Adult Children  Discharge Placement                       Discharge Plan and Services   Discharge Planning Services: CM Consult                                 Social Determinants of Health (SDOH) Interventions     Readmission Risk Interventions No flowsheet data found.

## 2020-09-03 NOTE — Progress Notes (Signed)
PT Cancellation Note  Patient Details Name: Lori Ellis MRN: [id number] DOB: December 02, 1920   Cancelled Treatment:    Reason Eval/Treat Not Completed: Other (comment) (agitated)   [name],[name] 09/03/2020, 8:50 AM Thomasene Mohair PT, DPT Acute Rehabilitation Services Pager: [phone] Office: [phone]

## 2020-09-04 ENCOUNTER — Inpatient Hospital Stay (HOSPITAL_COMMUNITY)
Admission: EM | Admit: 2020-09-04 | Discharge: 2020-09-17 | DRG: 640 | Disposition: A | Discharge status: Hospice | Payer: PPO | Attending: Internal Medicine | Admitting: Internal Medicine

## 2020-09-04 ENCOUNTER — Emergency Department (HOSPITAL_COMMUNITY): Payer: PPO

## 2020-09-04 ENCOUNTER — Other Ambulatory Visit: Payer: Self-pay

## 2020-09-04 DIAGNOSIS — L8990 Pressure ulcer of unspecified site, unspecified stage: Secondary | ICD-10-CM | POA: Insufficient documentation

## 2020-09-04 DIAGNOSIS — Z20822 Contact with and (suspected) exposure to covid-19: Secondary | ICD-10-CM | POA: Diagnosis present

## 2020-09-04 DIAGNOSIS — N390 Urinary tract infection, site not specified: Secondary | ICD-10-CM | POA: Diagnosis not present

## 2020-09-04 DIAGNOSIS — Z9049 Acquired absence of other specified parts of digestive tract: Secondary | ICD-10-CM

## 2020-09-04 DIAGNOSIS — B9620 Unspecified Escherichia coli [E. coli] as the cause of diseases classified elsewhere: Secondary | ICD-10-CM | POA: Diagnosis present

## 2020-09-04 DIAGNOSIS — R21 Rash and other nonspecific skin eruption: Secondary | ICD-10-CM | POA: Diagnosis not present

## 2020-09-04 DIAGNOSIS — E039 Hypothyroidism, unspecified: Secondary | ICD-10-CM | POA: Diagnosis present

## 2020-09-04 DIAGNOSIS — D696 Thrombocytopenia, unspecified: Secondary | ICD-10-CM | POA: Diagnosis not present

## 2020-09-04 DIAGNOSIS — Z888 Allergy status to other drugs, medicaments and biological substances status: Secondary | ICD-10-CM

## 2020-09-04 DIAGNOSIS — G9341 Metabolic encephalopathy: Secondary | ICD-10-CM | POA: Diagnosis present

## 2020-09-04 DIAGNOSIS — J9811 Atelectasis: Secondary | ICD-10-CM | POA: Diagnosis not present

## 2020-09-04 DIAGNOSIS — Z880 Allergy status to penicillin: Secondary | ICD-10-CM

## 2020-09-04 DIAGNOSIS — E875 Hyperkalemia: Secondary | ICD-10-CM | POA: Diagnosis present

## 2020-09-04 DIAGNOSIS — I10 Essential (primary) hypertension: Secondary | ICD-10-CM | POA: Diagnosis not present

## 2020-09-04 DIAGNOSIS — R4189 Other symptoms and signs involving cognitive functions and awareness: Secondary | ICD-10-CM | POA: Diagnosis present

## 2020-09-04 DIAGNOSIS — E876 Hypokalemia: Secondary | ICD-10-CM | POA: Diagnosis not present

## 2020-09-04 DIAGNOSIS — Z79890 Hormone replacement therapy (postmenopausal): Secondary | ICD-10-CM

## 2020-09-04 DIAGNOSIS — N1831 Chronic kidney disease, stage 3a: Secondary | ICD-10-CM | POA: Diagnosis not present

## 2020-09-04 DIAGNOSIS — Z6822 Body mass index (BMI) 22.0-22.9, adult: Secondary | ICD-10-CM

## 2020-09-04 DIAGNOSIS — G9340 Encephalopathy, unspecified: Secondary | ICD-10-CM | POA: Diagnosis not present

## 2020-09-04 DIAGNOSIS — E1129 Type 2 diabetes mellitus with other diabetic kidney complication: Secondary | ICD-10-CM | POA: Diagnosis present

## 2020-09-04 DIAGNOSIS — M542 Cervicalgia: Secondary | ICD-10-CM | POA: Diagnosis not present

## 2020-09-04 DIAGNOSIS — R4182 Altered mental status, unspecified: Secondary | ICD-10-CM | POA: Diagnosis not present

## 2020-09-04 DIAGNOSIS — N3000 Acute cystitis without hematuria: Secondary | ICD-10-CM | POA: Diagnosis not present

## 2020-09-04 DIAGNOSIS — Z886 Allergy status to analgesic agent status: Secondary | ICD-10-CM

## 2020-09-04 DIAGNOSIS — R55 Syncope and collapse: Secondary | ICD-10-CM | POA: Diagnosis present

## 2020-09-04 DIAGNOSIS — R9401 Abnormal electroencephalogram [EEG]: Secondary | ICD-10-CM | POA: Diagnosis present

## 2020-09-04 DIAGNOSIS — L89616 Pressure-induced deep tissue damage of right heel: Secondary | ICD-10-CM | POA: Diagnosis not present

## 2020-09-04 DIAGNOSIS — L89152 Pressure ulcer of sacral region, stage 2: Secondary | ICD-10-CM | POA: Diagnosis not present

## 2020-09-04 DIAGNOSIS — Z87440 Personal history of urinary (tract) infections: Secondary | ICD-10-CM

## 2020-09-04 DIAGNOSIS — Z887 Allergy status to serum and vaccine status: Secondary | ICD-10-CM

## 2020-09-04 DIAGNOSIS — Z79899 Other long term (current) drug therapy: Secondary | ICD-10-CM

## 2020-09-04 DIAGNOSIS — E872 Acidosis: Secondary | ICD-10-CM | POA: Diagnosis present

## 2020-09-04 DIAGNOSIS — E1122 Type 2 diabetes mellitus with diabetic chronic kidney disease: Secondary | ICD-10-CM | POA: Diagnosis not present

## 2020-09-04 DIAGNOSIS — N179 Acute kidney failure, unspecified: Secondary | ICD-10-CM | POA: Diagnosis not present

## 2020-09-04 DIAGNOSIS — R627 Adult failure to thrive: Secondary | ICD-10-CM | POA: Diagnosis present

## 2020-09-04 DIAGNOSIS — M5442 Lumbago with sciatica, left side: Secondary | ICD-10-CM | POA: Diagnosis present

## 2020-09-04 DIAGNOSIS — Z66 Do not resuscitate: Secondary | ICD-10-CM | POA: Diagnosis present

## 2020-09-04 DIAGNOSIS — Z515 Encounter for palliative care: Secondary | ICD-10-CM

## 2020-09-04 DIAGNOSIS — Z993 Dependence on wheelchair: Secondary | ICD-10-CM

## 2020-09-04 DIAGNOSIS — G8929 Other chronic pain: Secondary | ICD-10-CM | POA: Diagnosis present

## 2020-09-04 DIAGNOSIS — Z7401 Bed confinement status: Secondary | ICD-10-CM

## 2020-09-04 DIAGNOSIS — I1310 Hypertensive heart and chronic kidney disease without heart failure, with stage 1 through stage 4 chronic kidney disease, or unspecified chronic kidney disease: Secondary | ICD-10-CM | POA: Diagnosis present

## 2020-09-04 DIAGNOSIS — Z885 Allergy status to narcotic agent status: Secondary | ICD-10-CM

## 2020-09-04 DIAGNOSIS — E1142 Type 2 diabetes mellitus with diabetic polyneuropathy: Secondary | ICD-10-CM | POA: Diagnosis present

## 2020-09-04 DIAGNOSIS — E860 Dehydration: Principal | ICD-10-CM | POA: Diagnosis present

## 2020-09-04 DIAGNOSIS — N1832 Chronic kidney disease, stage 3b: Secondary | ICD-10-CM | POA: Diagnosis present

## 2020-09-04 DIAGNOSIS — M5441 Lumbago with sciatica, right side: Secondary | ICD-10-CM | POA: Diagnosis present

## 2020-09-04 DIAGNOSIS — E559 Vitamin D deficiency, unspecified: Secondary | ICD-10-CM | POA: Diagnosis present

## 2020-09-04 DIAGNOSIS — R404 Transient alteration of awareness: Secondary | ICD-10-CM | POA: Diagnosis not present

## 2020-09-04 DIAGNOSIS — M48061 Spinal stenosis, lumbar region without neurogenic claudication: Secondary | ICD-10-CM | POA: Diagnosis present

## 2020-09-04 DIAGNOSIS — I517 Cardiomegaly: Secondary | ICD-10-CM | POA: Diagnosis not present

## 2020-09-04 DIAGNOSIS — R54 Age-related physical debility: Secondary | ICD-10-CM | POA: Diagnosis present

## 2020-09-04 LAB — CBC WITH DIFFERENTIAL/PLATELET
Abs Immature Granulocytes: 0.03 10*3/uL (ref 0.00–0.07)
Basophils Absolute: 0.1 10*3/uL (ref 0.0–0.1)
Basophils Relative: 1 %
Eosinophils Absolute: 0.3 10*3/uL (ref 0.0–0.5)
Eosinophils Relative: 2 %
HCT: 43.8 % (ref 36.0–46.0)
Hemoglobin: 13.9 g/dL (ref 12.0–15.0)
Immature Granulocytes: 0 %
Lymphocytes Relative: 29 %
Lymphs Abs: 3.6 10*3/uL (ref 0.7–4.0)
MCH: 30 pg (ref 26.0–34.0)
MCHC: 31.7 g/dL (ref 30.0–36.0)
MCV: 94.6 fL (ref 80.0–100.0)
Monocytes Absolute: 1.1 10*3/uL — ABNORMAL HIGH (ref 0.1–1.0)
Monocytes Relative: 9 %
Neutro Abs: 7.1 10*3/uL (ref 1.7–7.7)
Neutrophils Relative %: 59 %
Platelets: 273 10*3/uL (ref 150–400)
RBC: 4.63 MIL/uL (ref 3.87–5.11)
RDW: 14.5 % (ref 11.5–15.5)
WBC: 12.1 10*3/uL — ABNORMAL HIGH (ref 4.0–10.5)
nRBC: 0 % (ref 0.0–0.2)

## 2020-09-04 LAB — URINALYSIS, COMPLETE (UACMP) WITH MICROSCOPIC
Bilirubin Urine: NEGATIVE
Glucose, UA: NEGATIVE mg/dL
Hgb urine dipstick: NEGATIVE
Ketones, ur: 5 mg/dL — AB
Nitrite: NEGATIVE
Protein, ur: 100 mg/dL — AB
Specific Gravity, Urine: 1.018 (ref 1.005–1.030)
WBC, UA: >50 WBC/hpf — ABNORMAL HIGH (ref 0–5)
pH: 5 (ref 5.0–8.0)

## 2020-09-04 LAB — COMPREHENSIVE METABOLIC PANEL
ALT: 16 U/L (ref 0–44)
AST: 29 U/L (ref 15–41)
Albumin: 4.6 g/dL (ref 3.5–5.0)
Alkaline Phosphatase: 80 U/L (ref 38–126)
Anion gap: 13 (ref 5–15)
BUN: 33 mg/dL — ABNORMAL HIGH (ref 8–23)
CO2: 24 mmol/L (ref 22–32)
Calcium: 9.7 mg/dL (ref 8.9–10.3)
Chloride: 103 mmol/L (ref 98–111)
Creatinine, Ser: 1.43 mg/dL — ABNORMAL HIGH (ref 0.44–1.00)
GFR, Estimated: 33 mL/min — ABNORMAL LOW (ref >60)
Glucose, Bld: 130 mg/dL — ABNORMAL HIGH (ref 70–99)
Potassium: 3.6 mmol/L (ref 3.5–5.1)
Sodium: 140 mmol/L (ref 135–145)
Total Bilirubin: 0.9 mg/dL (ref 0.3–1.2)
Total Protein: 8.4 g/dL — ABNORMAL HIGH (ref 6.5–8.1)

## 2020-09-04 LAB — CBG MONITORING, ED: Glucose-Capillary: 137 mg/dL — ABNORMAL HIGH (ref 70–99)

## 2020-09-04 LAB — RESP PANEL BY RT-PCR (FLU A&B, COVID) ARPGX2
Influenza A by PCR: NEGATIVE
Influenza B by PCR: NEGATIVE
SARS Coronavirus 2 by RT PCR: NEGATIVE

## 2020-09-04 LAB — TROPONIN I (HIGH SENSITIVITY): Troponin I (High Sensitivity): 25 ng/L — ABNORMAL HIGH (ref <18)

## 2020-09-04 LAB — LACTIC ACID, PLASMA: Lactic Acid, Venous: 1.3 mmol/L (ref 0.5–1.9)

## 2020-09-04 MED ORDER — ENOXAPARIN SODIUM 30 MG/0.3ML ~~LOC~~ SOLN
30.0000 mg | SUBCUTANEOUS | Status: DC
  Administered 2020-09-06 – 2020-09-13 (×8): 30 mg via SUBCUTANEOUS
  Filled 2020-09-04 (×10): qty 0.3

## 2020-09-04 MED ORDER — PROPRANOLOL HCL 20 MG PO TABS: 40.0000 mg | ORAL_TABLET | Freq: Every day | ORAL | Status: DC

## 2020-09-04 MED ORDER — AMLODIPINE BESYLATE 5 MG PO TABS: 5.0000 mg | ORAL_TABLET | Freq: Every morning | ORAL | Status: DC

## 2020-09-04 MED ORDER — SODIUM CHLORIDE 0.9 % IV SOLN
1.0000 g | INTRAVENOUS | Status: AC
  Administered 2020-09-05 – 2020-09-09 (×5): 1 g via INTRAVENOUS
  Filled 2020-09-04: qty 1
  Filled 2020-09-04 (×2): qty 10
  Filled 2020-09-04 (×2): qty 1

## 2020-09-04 MED ORDER — PROPRANOLOL HCL 20 MG PO TABS
20.0000 mg | ORAL_TABLET | Freq: Every day | ORAL | Status: DC
  Administered 2020-09-05: 20 mg via ORAL
  Filled 2020-09-04: qty 1

## 2020-09-04 MED ORDER — LEVOTHYROXINE SODIUM 75 MCG PO TABS
75.0000 ug | ORAL_TABLET | Freq: Every day | ORAL | Status: DC
  Administered 2020-09-06 – 2020-09-08 (×3): 75 ug via ORAL
  Filled 2020-09-04 (×3): qty 1

## 2020-09-04 MED ORDER — ONDANSETRON HCL 4 MG/2ML IJ SOLN: 4.0000 mg | Freq: Four times a day (QID) | INTRAMUSCULAR | Status: DC | PRN

## 2020-09-04 MED ORDER — POLYETHYLENE GLYCOL 3350 17 G PO PACK: 17.0000 g | PACK | Freq: Every day | ORAL | Status: DC | PRN

## 2020-09-04 MED ORDER — SODIUM CHLORIDE 0.9 % IV SOLN
1.0000 g | Freq: Once | INTRAVENOUS | Status: AC
  Administered 2020-09-04: 1 g via INTRAVENOUS
  Filled 2020-09-04: qty 10

## 2020-09-04 MED ORDER — ONDANSETRON HCL 4 MG PO TABS: 4.0000 mg | ORAL_TABLET | Freq: Four times a day (QID) | ORAL | Status: DC | PRN

## 2020-09-04 MED ORDER — AMLODIPINE BESYLATE 5 MG PO TABS
2.5000 mg | ORAL_TABLET | Freq: Every day | ORAL | Status: DC
  Administered 2020-09-05: 2.5 mg via ORAL
  Filled 2020-09-04: qty 1

## 2020-09-04 MED ORDER — ALBUTEROL SULFATE (2.5 MG/3ML) 0.083% IN NEBU: 2.5000 mg | INHALATION_SOLUTION | Freq: Four times a day (QID) | RESPIRATORY_TRACT | Status: DC | PRN

## 2020-09-04 MED ORDER — MELATONIN 3 MG PO TABS: 3.0000 mg | ORAL_TABLET | Freq: Every evening | ORAL | Status: DC | PRN

## 2020-09-04 MED ORDER — ACETAMINOPHEN 325 MG PO TABS
650.0000 mg | ORAL_TABLET | Freq: Four times a day (QID) | ORAL | Status: DC | PRN
Start: 2020-09-04 — End: 2020-09-13
  Administered 2020-09-13: 650 mg via ORAL
  Filled 2020-09-04: qty 2

## 2020-09-04 MED ORDER — GABAPENTIN 300 MG PO CAPS
300.0000 mg | ORAL_CAPSULE | Freq: Two times a day (BID) | ORAL | Status: DC
  Administered 2020-09-05: 300 mg via ORAL
  Filled 2020-09-04: qty 1

## 2020-09-04 MED ORDER — SODIUM CHLORIDE 0.9 % IV BOLUS
1000.0000 mL | Freq: Once | INTRAVENOUS | Status: AC
  Administered 2020-09-04: 1000 mL via INTRAVENOUS

## 2020-09-04 MED ORDER — ACETAMINOPHEN 650 MG RE SUPP
650.0000 mg | Freq: Four times a day (QID) | RECTAL | Status: DC | PRN
Start: 2020-09-04 — End: 2020-09-13
  Administered 2020-09-08 – 2020-09-11 (×2): 650 mg via RECTAL
  Filled 2020-09-04 (×2): qty 1

## 2020-09-04 MED ORDER — LACTATED RINGERS IV SOLN: INTRAVENOUS | Status: AC

## 2020-09-04 MED ORDER — VITAMIN D 25 MCG (1000 UNIT) PO TABS: 2000.0000 [IU] | ORAL_TABLET | Freq: Every morning | ORAL | Status: DC

## 2020-09-04 NOTE — ED Notes (Signed)
bair hugger applied to pt

## 2020-09-04 NOTE — ED Notes (Signed)
o2 sensor switched to ear for better reading

## 2020-09-04 NOTE — H&P (Signed)
History and Physical  Patient Name: Lori Ellis     XTG:[id number]    DOB: 1921/05/19    DOA: 09/04/2020 PCP: Truett Perna, MD  Patient coming from: Nursing facility  Chief Complaint: Encephalopathy    HPI: Lori Ellis is a 87 y.o. female, with PMH of essential hypertension, type 2 diabetes mellitus, peripheral neuropathy, spinal stenosis, chronic pain, hypothyroidism, vitamin D deficiency who presented to the ER on 09/04/2020 with metabolic encephalopathy  At the time exam, patient is encephalopathic, thus a complete and thorough HPI review of systems unable to be obtained.  Patient's son, [name] is bedside to provide history.  Patient was just seen in the hospital a day ago.  Patient was hospitalized for syncopal-like episode.  He was observed overnight.  She was treated for UTI with Rocephin and discharged on cefdinir.  Apparently since going back to facility she has been encephalopathic.  She has been thinking members of her family are still alive that are currently dead.  Not able to eat.  Has been drinking a lot.  She also is complaining of neck pain however son noted she was sitting in a strange position.  She has not been sleeping much as well.  She is primarily bedbound but typically is able to eat and drink her style, and transfer to and from the wheelchair.  No recent changes in medications.  Due to concern for her altered mental status, her son brought her to the ER for evaluation.    ED course: -Vitals on admission: Afebrile, heart rate 72, respiratory rate 20, blood pressure 140/61 -Labs on initial presentation: Sodium 140, potassium 3.6, chloride 103, bicarb 24, glucose 130, BUN 33, creatinine 1.43, lactic acid 1.3, WBC 12.1, hemoglobin 13.9. -Imaging obtained on admission: CT head showed no acute processes.  Chest x-ray unremarkable as well. -In the ED the patient was given Rocephin, IV fluids, and the hospitalist service was contacted for further evaluation and  management.     ROS: Unable to fully obtain due to patient status     Past Medical History:  Diagnosis Date  . Degenerative disc disease, lumbar   . Diabetes mellitus without complication (HCC)   . Hypertension   . Renal disorder   . Thyroid disease     Past Surgical History:  Procedure Laterality Date  . BLADDER SURGERY    . CHOLECYSTECTOMY    . INTRAMEDULLARY (IM) NAIL INTERTROCHANTERIC Right 01/02/2019   Procedure: INTRAMEDULLARY (IM) NAIL INTERTROCHANTRIC right hip;  Surgeon: Myrene Galas, MD;  Location: [facility] OR;  Service: Orthopedics;  Laterality: Right;    Social History: Patient lives in her facility.  The patient is wheelchair-bound. Non Smoker.  Allergies  Allergen Reactions  . Fentanyl Other (See Comments)    Severe delusions    . Penicillins Hives and Rash    Did it involve swelling of the face/tongue/throat, SOB, or low BP? Unk Did it involve sudden or severe rash/hives, skin peeling, or any reaction on the inside of your mouth or nose? Unk Did you need to seek medical attention at a hospital or doctor's office? Unk When did it last happen? "A long time ago" If all above answers are "NO", may proceed with cephalosporin use.   . Tetanus Toxoid Hives  . Adhesive [Tape] Other (See Comments)    TEARS THE SKIN!!  . Advil [Ibuprofen] Other (See Comments)    Was told by MD to not take  . Hydrochlorothiazide Other (See Comments)    Hyponatremia   .  Naproxen Sodium Rash    Family history: family history is not on file.  Prior to Admission medications   Medication Sig Start Date End Date Taking? Authorizing Provider  acetaminophen (TYLENOL) 325 MG tablet Take 2 tablets (650 mg total) by mouth every 6 (six) hours as needed for mild pain. Patient taking differently: Take 650 mg by mouth every 6 (six) hours as needed for headache or fever (pain). Take 2 tablets (650 mg) by mouth twice daily, may also take 2 tablets (650 mg) every 6 hours as needed for  pain/fever/headache 06/21/17  Yes Santos-Sanchez, Chelsea Primus, MD  amLODipine (NORVASC) 2.5 MG tablet Take 1 tablet (2.5 mg total) by mouth at bedtime. 01/10/19  Yes Roberto Scales D, MD  amLODipine (NORVASC) 5 MG tablet Take 5 mg by mouth every morning.   Yes [provider]  cefdinir (OMNICEF) 300 MG capsule Take 1 capsule (300 mg total) by mouth daily for 5 days. 09/04/20 09/09/20 Yes [place], Alvira Philips, DO  Cholecalciferol (VITAMIN D3) 50 MCG (2000 UT) TABS Take 2,000 Units by mouth every morning.   Yes [provider]  gabapentin (NEURONTIN) 300 MG capsule Take 1 capsule (300 mg total) by mouth at bedtime. Patient taking differently: Take 300 mg by mouth 2 (two) times daily. 01/10/19  Yes Roberto Scales D, MD  levothyroxine (SYNTHROID) 75 MCG tablet Take 1 tablet (75 mcg total) by mouth daily before breakfast. 01/10/19  Yes Roberto Scales D, MD  lisinopril (ZESTRIL) 5 MG tablet Take 5 mg by mouth every morning.   Yes [provider]  Melatonin 3 MG TABS Take 1 tablet (3 mg total) by mouth at bedtime as needed (sleep). Patient taking differently: Take 3 mg by mouth at bedtime. 01/10/19  Yes Roberto Scales D, MD  propranolol (INDERAL) 20 MG tablet Take 1-2 tablets (20-40 mg total) by mouth See admin instructions. Take 40 mg by mouth in the morning and 20 mg at 4:30 PM Patient taking differently: Take 20-40 mg by mouth See admin instructions. Take 2 tablets (40 mg) by mouth every morning and 1 tablet (20 mg) at bedtime 01/10/19  Yes Roberto Scales D, MD  senna (SENOKOT) 8.6 MG TABS tablet Take 1 tablet (8.6 mg total) by mouth 2 (two) times daily. Patient taking differently: Take 1 tablet by mouth daily as needed for mild constipation. 01/10/19  Yes Roberto Scales D, MD  traMADol (ULTRAM) 50 MG tablet Take 50 mg by mouth every 12 (twelve) hours as needed for moderate pain.   Yes [provider]  vitamin C (VITAMIN C) 500 MG tablet Take 1 tablet (500 mg total) by mouth daily.  01/04/19  Yes Montez Morita, PA-C       Physical Exam: BP (!) 156/72 (BP Location: Left Arm)   Pulse 75   Temp (!) 96.6 F (35.9 C) (Rectal)   Resp 18   Ht  (1.6 m)   Wt 58.5 kg   SpO2 98%   BMI 22.85 kg/m   General appearance:  Frail-appearing, adult female,  in no acute distress  Eyes:  Crust around eyelid  ENT: No nasal deformity, discharge, epistaxis.  Hearing intact. OP moist without lesions.   Neck: No neck masses.  Trachea midline.  No thyromegaly/tenderness. Lymph: No cervical or supraclavicular lymphadenopathy. Skin: Warm and dry.  No jaundice.  No suspicious rashes or lesions. Cardiac: RRR, nl S1-S2, no murmurs appreciated.  No LE edema.  Radial and pedal pulses 2+ and symmetric. Respiratory: Normal respiratory rate and  rhythm.  CTAB without rales or wheezes. Abdomen: Abdomen soft.  No tenderness with palpation. No ascites, distension, hepatosplenomegaly.   MSK: No deformities or effusions of the large joints of the upper or lower extremities bilaterally.  No cyanosis or clubbing. Neuro: Cranial nerves difficult to assess due to patient status.  Sensation intact to light touch. Speech is fluent.  Marland Kitchen.    Psych:  Cephalopathic    Labs on Admission:  I have personally reviewed following labs and imaging studies: CBC: Recent Labs  Lab 09/02/20 1200 09/02/20 2031 09/03/20 0836 09/04/20 1851  WBC 8.3 10.2 9.9 12.1*  NEUTROABS 5.3  --   --  7.1  HGB 11.1* 12.0 12.6 13.9  HCT 35.6* 37.9 39.9 43.8  MCV 96.2 95.2 94.3 94.6  PLT 150 208 198 273   Basic Metabolic Panel: Recent Labs  Lab 09/02/20 1200 09/02/20 1915 09/03/20 0836 09/04/20 1851  NA 138  --  142 140  K 5.7* 4.2 3.7 3.6  CL 106  --  106 103  CO2 24  --  25 24  GLUCOSE 182*  --  138* 130*  BUN 32*  --  24* 33*  CREATININE 1.38* 1.06* 1.07* 1.43*  CALCIUM 8.8*  --  9.3 9.7   GFR: Estimated Creatinine Clearance: 18.6 mL/min (A) (by C-G formula based on SCr of 1.43 mg/dL (H)).  Liver Function  Tests: Recent Labs  Lab 09/02/20 1200 09/04/20 1851  AST 34 29  ALT 18 16  ALKPHOS 64 80  BILITOT 0.9 0.9  PROT 7.0 8.4*  ALBUMIN 3.7 4.6   No results for input(s): LIPASE, AMYLASE in the last 168 hours. No results for input(s): AMMONIA in the last 168 hours. Coagulation Profile: No results for input(s): INR, PROTIME in the last 168 hours. Cardiac Enzymes: No results for input(s): CKTOTAL, CKMB, CKMBINDEX, TROPONINI in the last 168 hours. BNP (last 3 results) No results for input(s): PROBNP in the last 8760 hours. HbA1C: Recent Labs    09/02/20 1915  HGBA1C 6.6*   CBG: Recent Labs  Lab 09/04/20 1835  GLUCAP 137*   Lipid Profile: No results for input(s): CHOL, HDL, LDLCALC, TRIG, CHOLHDL, LDLDIRECT in the last 72 hours. Thyroid Function Tests: Recent Labs    09/02/20 1915  TSH 2.819   Anemia Panel: No results for input(s): VITAMINB12, FOLATE, FERRITIN, TIBC, IRON, RETICCTPCT in the last 72 hours.   Recent Results (from the past 240 hour(s))  SARS CORONAVIRUS 2 (TAT 6-24 HRS) Nasopharyngeal Nasopharyngeal Swab     Status: None   Collection Time: 09/02/20 12:00 PM   Specimen: Nasopharyngeal Swab  Result Value Ref Range Status   SARS Coronavirus 2 NEGATIVE NEGATIVE Final    Comment: (NOTE) SARS-CoV-2 target nucleic acids are NOT DETECTED.  The SARS-CoV-2 RNA is generally detectable in upper and lower respiratory specimens during the acute phase of infection. Negative results do not preclude SARS-CoV-2 infection, do not rule out co-infections with other pathogens, and should not be used as the sole basis for treatment or other patient management decisions. Negative results must be combined with clinical observations, patient history, and epidemiological information. The expected result is Negative.  Fact Sheet for Patients: HairSlick.nohttps://www.fda.gov/media/138098/download  Fact Sheet for Healthcare Providers: quierodirigir.comhttps://www.fda.gov/media/138095/download  This test  is not yet approved or cleared by the [place] FDA and  has been authorized for detection and/or diagnosis of SARS-CoV-2 by FDA under an Emergency Use Authorization (EUA). This EUA will remain  in effect (meaning this test can be  used) for the duration of the COVID-19 declaration under Se ction 564(b)(1) of the Act, 21 U.S.C. section 360bbb-3(b)(1), unless the authorization is terminated or revoked sooner.  Performed at [facility], [address] [place], [place] [address]   Culture, Urine     Status: Abnormal (Preliminary result)   Collection Time: 09/02/20  7:15 PM   Specimen: Urine, Random  Result Value Ref Range Status   Specimen Description   Final    URINE, RANDOM Performed at [facility], [address] [place], [place] [address]    Special Requests   Final    NONE Performed at [facility], [address], [place], [place] [address]    Culture (A)  Final    >=100,000 COLONIES/mL ESCHERICHIA COLI SUSCEPTIBILITIES TO FOLLOW Performed at [facility], [address], [place], [place] [address]    Report Status PENDING  Incomplete           Radiological Exams on Admission: Personally reviewed imaging which shows: CT head showed no acute processes.  Chest x-ray unremarkable as well. CT HEAD WO CONTRAST  Result Date: 09/04/2020 CLINICAL DATA:  Altered mental status. EXAM: CT HEAD WITHOUT CONTRAST TECHNIQUE: Contiguous axial images were obtained from the base of the skull through the vertex without intravenous contrast. COMPARISON:  CT head dated 06/19/2020. FINDINGS: Brain: No evidence of acute infarction, hemorrhage, hydrocephalus, extra-axial collection or mass lesion/mass effect. There is mild cerebral volume loss with associated ex vacuo dilatation. Periventricular white matter hypoattenuation likely represents chronic small vessel ischemic disease. Vascular: There are vascular calcifications in the carotid  siphons. Skull: Normal. Negative for fracture or focal lesion. Sinuses/Orbits: No acute finding. Other: None. IMPRESSION: No acute intracranial process. Electronically Signed   By: Romona Curls M.D.   On: 09/04/2020 18:51   DG Chest Port 1 View  Result Date: 09/04/2020 CLINICAL DATA:  87 year old female with altered mental status. EXAM: PORTABLE CHEST 1 VIEW COMPARISON:  Chest radiograph dated 09/02/2020. FINDINGS: Probable left lung base atelectasis. No focal consolidation, pleural effusion or pneumothorax. Stable cardiomegaly. Atherosclerotic calcification of the aorta. Osteopenia with degenerative changes of the spine and shoulders. No acute osseous pathology. IMPRESSION: No active disease. Electronically Signed   By: Elgie Collard M.D.   On: 09/04/2020 21:23         Assessment/Plan   1.  Metabolic encephalopathy -Suspect likely related to delirium, dysregulation of sleep-wake cycle as she has not been sleeping well and has had changes in environment.  However UTI could be contributing, as well as hypovolemia -Continue Rocephin while inpatient -Encourage regulation of sleep-wake cycle.  Avoid waking patient at night, monitor sleep.  And encouraged her staying awake during the day -N.p.o. until mentation improves and passes bedside swallow -Started on IV fluids -Follow-up finalization of urine culture  2.  UTI -Urine culture obtained on 3/17 growing E. coli, sensitivities pending -Follow-up finalization of urine culture -Continue Rocephin while inpatient  3.  AKI on CKD 3a -On admission creatinine 1.43.  Baseline appears around 1 -Suspect secondary to hypovolemia -Start on maintenance IV fluids -Hold home lisinopril due to AKI -Encourage diet/p.o. intake once mentation improves -Follow-up labs ordered -Avoid NSAIDs  4.  Essential hypertension -Resume home regiment of propanolol and Norvasc -Hold home lisinopril due to AKI  5.  Hypothyroidism -TSH 2.8 on  09/02/2020 -Continue home dose of Synthroid  6.  Type 2 diabetes, non-insulin-dependent -Recent hemoglobin A1c 6.6 -Given age and comorbidities, would not  recommend treating given risks of hypoglycemia outweigh the benefits     DVT prophylaxis: Lovenox Code Status: DNR Family Communication: Son bedside Disposition Plan: Anticipate discharge nursing facility when medically optimized Consults called: None Admission status: Observation   At the point of initial evaluation, it is my clinical opinion that admission for OBSERVATION is reasonable and necessary because the patient's presenting complaints in the context of their chronic conditions represent sufficient risk of deterioration or significant morbidity to constitute reasonable grounds for close observation in the hospital setting, but that the patient may be medically stable for discharge from the hospital within 24 to 48 hours.    Medical decision making: Patient seen at 11:24 PM on 09/04/2020.  The patient was discussed with ER provider.  What exists of the patient's chart was reviewed in depth and summarized above.  Clinical condition: Fair.        Laqueta Due [facility] Please page though AMION or Epic secure chat:  For password, contact charge nurse

## 2020-09-04 NOTE — ED Triage Notes (Signed)
Pt came from [place] [facility] [address] via EMS. C/c AMS. Son reports visitng pt today and pt does not seem like herself to son, son wanted pt to come to ED to get checked out. Son also reports headache and neck pain the past few days. EMS reports that pt will not answer questions and pulls EKG leads off when attached.

## 2020-09-04 NOTE — ED Provider Notes (Signed)
[facility]-EMERGENCY DEPT Provider Note   CSN: [id number] Arrival date & time: 09/04/20  1733     History No chief complaint on file.   Lori Ellis is a 87 y.o. female.  Patient presents to the emergency department by EMS from [facility] facility.  Patient has altered mental status.  Per EMS, son reports complaint of headache and neck pain today.  Patient was hospitalized from 3/17-18 for syncopal episode thought to be vasovagal.  No head imaging at that time.  Patient was agitated for EMS, pulled off cardiac leads.  They note incomprehensible speech when asking questions.  Previous pansensitive E. coli UTI.  Recent culture grew out E. coli, pending sensitivities.  She was discharged home on cefdinir.  Level 5 caveat for altered mental status.  Additional history obtained from patient's son, [name], after initial exam.  Patient typically is wheelchair-bound but can converse, follow instructions, perform basic ADLs.  Something changed around the time of recent admission.  She is more confused, not recognizing family members.  She is talking about deceased relatives as though they are still alive.  She has not been drinking a lot of water despite encouragement and is unable to feed herself without assistance.        Past Medical History:  Diagnosis Date  . Degenerative disc disease, lumbar   . Diabetes mellitus without complication (HCC)   . Hypertension   . Renal disorder   . Thyroid disease     Patient Active Problem List   Diagnosis Date Noted  . Acute lower UTI 09/03/2020  . Lethargy 01/08/2019  . Closed intertrochanteric fracture, right, initial encounter (HCC) 01/01/2019  . Atrial flutter by electrocardiogram (HCC) 01/01/2019  . Benign essential HTN   . Spinal stenosis, lumbar 06/12/2017  . Chronic bilateral low back pain with bilateral sciatica 03/07/2016  . Spondylolisthesis of lumbar region 03/07/2016  . CKD (chronic kidney disease)  stage 3, GFR 30-59 ml/min (HCC) 03/12/2014  . Hypothyroidism 09/02/2013  . Type II diabetes mellitus with renal manifestations (HCC) 09/02/2013  . DDD (degenerative disc disease), lumbosacral 09/01/2013    Past Surgical History:  Procedure Laterality Date  . BLADDER SURGERY    . CHOLECYSTECTOMY    . INTRAMEDULLARY (IM) NAIL INTERTROCHANTERIC Right 01/02/2019   Procedure: INTRAMEDULLARY (IM) NAIL INTERTROCHANTRIC right hip;  Surgeon: Myrene Galas, MD;  Location: [facility] OR;  Service: Orthopedics;  Laterality: Right;     OB History   No obstetric history on file.     No family history on file.  Social History   Tobacco Use  . Smoking status: Never Smoker  . Smokeless tobacco: Never Used  Substance Use Topics  . Alcohol use: No    Home Medications Prior to Admission medications   Medication Sig Start Date End Date Taking? Authorizing Provider  acetaminophen (TYLENOL) 325 MG tablet Take 2 tablets (650 mg total) by mouth every 6 (six) hours as needed for mild pain. Patient taking differently: Take 325 mg by mouth in the morning and at bedtime. 06/21/17   Santos-Sanchez, Chelsea Primus, MD  amLODipine (NORVASC) 2.5 MG tablet Take 1 tablet (2.5 mg total) by mouth at bedtime. 01/10/19   Roberto Scales D, MD  amLODipine (NORVASC) 5 MG tablet Take 5 mg by mouth daily.    [provider]  cefdinir (OMNICEF) 300 MG capsule Take 1 capsule (300 mg total) by mouth daily for 5 days. 09/04/20 09/09/20  [place], Eric J, DO  Cholecalciferol (VITAMIN D3) 50 MCG (2000  UT) TABS Take 1 tablet by mouth daily.    [provider]  gabapentin (NEURONTIN) 300 MG capsule Take 1 capsule (300 mg total) by mouth at bedtime. Patient taking differently: Take 300 mg by mouth 2 (two) times daily. 01/10/19   Laverna PeaceNettey, Shayla D, MD  levothyroxine (SYNTHROID) 75 MCG tablet Take 1 tablet (75 mcg total) by mouth daily before breakfast. 01/10/19   Roberto ScalesNettey, Shayla D, MD  lisinopril (ZESTRIL) 5 MG tablet Take 5 mg by  mouth daily.    [provider]  Melatonin 3 MG TABS Take 1 tablet (3 mg total) by mouth at bedtime as needed (sleep). Patient taking differently: Take 3 mg by mouth at bedtime. 01/10/19   Laverna PeaceNettey, Shayla D, MD  propranolol (INDERAL) 20 MG tablet Take 1-2 tablets (20-40 mg total) by mouth See admin instructions. Take 40 mg by mouth in the morning and 20 mg at 4:30 PM 01/10/19   Roberto ScalesNettey, Shayla D, MD  senna (SENOKOT) 8.6 MG TABS tablet Take 1 tablet (8.6 mg total) by mouth 2 (two) times daily. Patient taking differently: Take 1 tablet by mouth daily as needed for mild constipation. 01/10/19   Laverna PeaceNettey, Shayla D, MD  traMADol (ULTRAM) 50 MG tablet Take 50 mg by mouth every 12 (twelve) hours as needed for moderate pain.    [provider]  vitamin C (VITAMIN C) 500 MG tablet Take 1 tablet (500 mg total) by mouth daily. 01/04/19   Montez MoritaPaul, Keith, PA-C    Allergies    Fentanyl, Penicillins, Tetanus toxoid, Adhesive [tape], Advil [ibuprofen], Hydrochlorothiazide, and Naproxen sodium  Review of Systems   Review of Systems  Unable to perform ROS: Mental status change    Physical Exam Updated Vital Signs BP (!) 148/61 (BP Location: Left Arm)   Pulse 72   Temp 97.6 F (36.4 C) (Axillary)   Resp 20   Ht 5\' 3"  (1.6 m)   Wt 58.5 kg   SpO2 96%   BMI 22.85 kg/m   Physical Exam Vitals and nursing note reviewed.  Constitutional:      General: She is not in acute distress.    Appearance: She is well-developed.  HENT:     Head: Normocephalic and atraumatic.     Right Ear: External ear normal.     Left Ear: External ear normal.     Nose: Nose normal.  Eyes:     Conjunctiva/sclera: Conjunctivae normal.     Pupils: Pupils are equal, round, and reactive to light.  Cardiovascular:     Rate and Rhythm: Normal rate and regular rhythm.     Heart sounds: No murmur heard.   Pulmonary:     Effort: No respiratory distress.     Breath sounds: No wheezing, rhonchi or rales.  Abdominal:      Palpations: Abdomen is soft.     Tenderness: There is no abdominal tenderness. There is no guarding or rebound.  Musculoskeletal:     Cervical back: Normal range of motion and neck supple.     Right lower leg: No edema.     Left lower leg: No edema.     Comments: Patient complains of pain whenever I range her arms and legs including her hips.  She does not have any obvious swelling or bruising.  Skin:    General: Skin is warm and dry.     Findings: No rash.  Neurological:     Mental Status: She is alert.     Comments: Patient uncooperative with  exam.  She resists movements of all extremities and does not have any obvious weakness.  Psychiatric:        Mood and Affect: Mood normal.     ED Results / Procedures / Treatments   Labs (all labs ordered are listed, but only abnormal results are displayed) Labs Reviewed - No data to display  EKG EKG Interpretation  Date/Time:  Saturday September 04 2020 18:15:36 EDT Ventricular Rate:  58 PR Interval:    QRS Duration: 93 QT Interval:  443 QTC Calculation: 436 R Axis:   -12 Text Interpretation: Sinus rhythm Nonspecific T abnormalities, lateral leads , more pronounced since last tracing Confirmed by Linwood Dibbles [id number]) on 09/04/2020 6:17:51 PM   Radiology CT HEAD WO CONTRAST  Result Date: 09/04/2020 CLINICAL DATA:  Altered mental status. EXAM: CT HEAD WITHOUT CONTRAST TECHNIQUE: Contiguous axial images were obtained from the base of the skull through the vertex without intravenous contrast. COMPARISON:  CT head dated 06/19/2020. FINDINGS: Brain: No evidence of acute infarction, hemorrhage, hydrocephalus, extra-axial collection or mass lesion/mass effect. There is mild cerebral volume loss with associated ex vacuo dilatation. Periventricular white matter hypoattenuation likely represents chronic small vessel ischemic disease. Vascular: There are vascular calcifications in the carotid siphons. Skull: Normal. Negative for fracture or focal lesion.  Sinuses/Orbits: No acute finding. Other: None. IMPRESSION: No acute intracranial process. Electronically Signed   By: Romona Curls M.D.   On: 09/04/2020 18:51   DG Chest Port 1 View  Result Date: 09/04/2020 CLINICAL DATA:  87 year old female with altered mental status. EXAM: PORTABLE CHEST 1 VIEW COMPARISON:  Chest radiograph dated 09/02/2020. FINDINGS: Probable left lung base atelectasis. No focal consolidation, pleural effusion or pneumothorax. Stable cardiomegaly. Atherosclerotic calcification of the aorta. Osteopenia with degenerative changes of the spine and shoulders. No acute osseous pathology. IMPRESSION: No active disease. Electronically Signed   By: Elgie Collard M.D.   On: 09/04/2020 21:23    Procedures Procedures   Medications Ordered in ED Medications - No data to display  ED Course  I have reviewed the triage vital signs and the nursing notes.  Pertinent labs & imaging results that were available during my care of the patient were reviewed by me and considered in my medical decision making (see chart for details).  Patient seen and examined.  Discussed report with EMS on arrival.  I evaluated the patient after she was placed in the room.  She will not follow basic commands.  Her pupils are PERRL.  She did seem to get irritated when I was ranging her arms and legs.  I asked her if she was having pain in her arm while I was arranging it and she states that she was hurting everywhere in her arm.  She asked me to leave her alone and told her I was irritating her.  She would not tell me her name or where she was at.  She would not follow basic commands.  Work-up initiated.  Vital signs reviewed and are as follows: BP (!) 148/61 (BP Location: Left Arm)   Pulse 72   Temp 97.6 F (36.4 C) (Axillary)   Resp 20   Ht 5\' 3"  (1.6 m)   Wt 58.5 kg   SpO2 96%   BMI 22.85 kg/m   6:30 PM CT and EKG have been reviewed.   Patient son now at bedside, additional history  obtained.  Patient discussed with Dr. who will see.   Rectal temp 96.6 F.  Treatment for UTI  initiated.  Blood cultures and lactate sent.  Patient will need admission to the hospital for altered mental status, unclear etiology but active UTI and likely mild dehydration.  Discussed patient with Dr. Marikay Alar who will see patient.  BP (!) 156/72 (BP Location: Left Arm)   Pulse 75   Temp (!) 96.6 F (35.9 C) (Rectal)   Resp 18   Ht 5\' 3"  (1.6 m)   Wt 58.5 kg   SpO2 98%   BMI 22.85 kg/m      MDM Rules/Calculators/A&P                           Final Clinical Impression(s) / ED Diagnoses Final diagnoses:  Acute cystitis without hematuria  Encephalopathy    Rx / DC Orders ED Discharge Orders    None       , PA-C 09/04/20 2356    09/06/20, DO 09/05/20 1501

## 2020-09-04 NOTE — ED Notes (Signed)
Pt to CT

## 2020-09-05 ENCOUNTER — Encounter (HOSPITAL_COMMUNITY): Payer: Self-pay | Admitting: Internal Medicine

## 2020-09-05 ENCOUNTER — Inpatient Hospital Stay (HOSPITAL_COMMUNITY): Payer: PPO

## 2020-09-05 DIAGNOSIS — G8929 Other chronic pain: Secondary | ICD-10-CM | POA: Diagnosis present

## 2020-09-05 DIAGNOSIS — Z66 Do not resuscitate: Secondary | ICD-10-CM | POA: Diagnosis not present

## 2020-09-05 DIAGNOSIS — R55 Syncope and collapse: Secondary | ICD-10-CM | POA: Diagnosis present

## 2020-09-05 DIAGNOSIS — N1831 Chronic kidney disease, stage 3a: Secondary | ICD-10-CM | POA: Diagnosis not present

## 2020-09-05 DIAGNOSIS — R627 Adult failure to thrive: Secondary | ICD-10-CM | POA: Diagnosis not present

## 2020-09-05 DIAGNOSIS — R5383 Other fatigue: Secondary | ICD-10-CM | POA: Diagnosis not present

## 2020-09-05 DIAGNOSIS — E860 Dehydration: Secondary | ICD-10-CM | POA: Diagnosis present

## 2020-09-05 DIAGNOSIS — I1310 Hypertensive heart and chronic kidney disease without heart failure, with stage 1 through stage 4 chronic kidney disease, or unspecified chronic kidney disease: Secondary | ICD-10-CM | POA: Diagnosis present

## 2020-09-05 DIAGNOSIS — G9341 Metabolic encephalopathy: Secondary | ICD-10-CM | POA: Diagnosis not present

## 2020-09-05 DIAGNOSIS — I10 Essential (primary) hypertension: Secondary | ICD-10-CM | POA: Diagnosis not present

## 2020-09-05 DIAGNOSIS — N3000 Acute cystitis without hematuria: Secondary | ICD-10-CM | POA: Diagnosis not present

## 2020-09-05 DIAGNOSIS — M5442 Lumbago with sciatica, left side: Secondary | ICD-10-CM | POA: Diagnosis present

## 2020-09-05 DIAGNOSIS — E1122 Type 2 diabetes mellitus with diabetic chronic kidney disease: Secondary | ICD-10-CM | POA: Diagnosis present

## 2020-09-05 DIAGNOSIS — D696 Thrombocytopenia, unspecified: Secondary | ICD-10-CM | POA: Diagnosis not present

## 2020-09-05 DIAGNOSIS — N390 Urinary tract infection, site not specified: Secondary | ICD-10-CM | POA: Diagnosis present

## 2020-09-05 DIAGNOSIS — Z7189 Other specified counseling: Secondary | ICD-10-CM | POA: Diagnosis not present

## 2020-09-05 DIAGNOSIS — Z20822 Contact with and (suspected) exposure to covid-19: Secondary | ICD-10-CM | POA: Diagnosis present

## 2020-09-05 DIAGNOSIS — L89152 Pressure ulcer of sacral region, stage 2: Secondary | ICD-10-CM | POA: Diagnosis not present

## 2020-09-05 DIAGNOSIS — R52 Pain, unspecified: Secondary | ICD-10-CM | POA: Diagnosis not present

## 2020-09-05 DIAGNOSIS — E559 Vitamin D deficiency, unspecified: Secondary | ICD-10-CM | POA: Diagnosis present

## 2020-09-05 DIAGNOSIS — G9340 Encephalopathy, unspecified: Secondary | ICD-10-CM | POA: Diagnosis not present

## 2020-09-05 DIAGNOSIS — L89616 Pressure-induced deep tissue damage of right heel: Secondary | ICD-10-CM | POA: Diagnosis not present

## 2020-09-05 DIAGNOSIS — R4182 Altered mental status, unspecified: Secondary | ICD-10-CM | POA: Diagnosis not present

## 2020-09-05 DIAGNOSIS — R4189 Other symptoms and signs involving cognitive functions and awareness: Secondary | ICD-10-CM | POA: Diagnosis present

## 2020-09-05 DIAGNOSIS — E872 Acidosis: Secondary | ICD-10-CM | POA: Diagnosis present

## 2020-09-05 DIAGNOSIS — R54 Age-related physical debility: Secondary | ICD-10-CM | POA: Diagnosis present

## 2020-09-05 DIAGNOSIS — N179 Acute kidney failure, unspecified: Secondary | ICD-10-CM | POA: Diagnosis present

## 2020-09-05 DIAGNOSIS — M48061 Spinal stenosis, lumbar region without neurogenic claudication: Secondary | ICD-10-CM | POA: Diagnosis present

## 2020-09-05 DIAGNOSIS — Z515 Encounter for palliative care: Secondary | ICD-10-CM | POA: Diagnosis not present

## 2020-09-05 DIAGNOSIS — E875 Hyperkalemia: Secondary | ICD-10-CM | POA: Diagnosis present

## 2020-09-05 DIAGNOSIS — E039 Hypothyroidism, unspecified: Secondary | ICD-10-CM | POA: Diagnosis present

## 2020-09-05 DIAGNOSIS — I6782 Cerebral ischemia: Secondary | ICD-10-CM | POA: Diagnosis not present

## 2020-09-05 DIAGNOSIS — E1142 Type 2 diabetes mellitus with diabetic polyneuropathy: Secondary | ICD-10-CM | POA: Diagnosis present

## 2020-09-05 LAB — COMPREHENSIVE METABOLIC PANEL
ALT: 14 U/L (ref 0–44)
AST: 26 U/L (ref 15–41)
Albumin: 3.9 g/dL (ref 3.5–5.0)
Alkaline Phosphatase: 69 U/L (ref 38–126)
Anion gap: 13 (ref 5–15)
BUN: 33 mg/dL — ABNORMAL HIGH (ref 8–23)
CO2: 21 mmol/L — ABNORMAL LOW (ref 22–32)
Calcium: 9.1 mg/dL (ref 8.9–10.3)
Chloride: 108 mmol/L (ref 98–111)
Creatinine, Ser: 1.24 mg/dL — ABNORMAL HIGH (ref 0.44–1.00)
GFR, Estimated: 40 mL/min — ABNORMAL LOW (ref >60)
Glucose, Bld: 124 mg/dL — ABNORMAL HIGH (ref 70–99)
Potassium: 3.5 mmol/L (ref 3.5–5.1)
Sodium: 142 mmol/L (ref 135–145)
Total Bilirubin: 0.8 mg/dL (ref 0.3–1.2)
Total Protein: 7.1 g/dL (ref 6.5–8.1)

## 2020-09-05 LAB — CBC
HCT: 39.8 % (ref 36.0–46.0)
Hemoglobin: 12.4 g/dL (ref 12.0–15.0)
MCH: 29.9 pg (ref 26.0–34.0)
MCHC: 31.2 g/dL (ref 30.0–36.0)
MCV: 95.9 fL (ref 80.0–100.0)
Platelets: 231 10*3/uL (ref 150–400)
RBC: 4.15 MIL/uL (ref 3.87–5.11)
RDW: 14.6 % (ref 11.5–15.5)
WBC: 11.1 10*3/uL — ABNORMAL HIGH (ref 4.0–10.5)
nRBC: 0 % (ref 0.0–0.2)

## 2020-09-05 LAB — URINE CULTURE: Culture: >=100000 — AB

## 2020-09-05 MED ORDER — METOPROLOL TARTRATE 5 MG/5ML IV SOLN
2.5000 mg | Freq: Four times a day (QID) | INTRAVENOUS | Status: DC | PRN
  Filled 2020-09-05: qty 5

## 2020-09-05 MED ORDER — POLYVINYL ALCOHOL 1.4 % OP SOLN
2.0000 [drp] | Freq: Three times a day (TID) | OPHTHALMIC | Status: DC
  Administered 2020-09-05 – 2020-09-16 (×29): 2 [drp] via OPHTHALMIC
  Filled 2020-09-05: qty 15

## 2020-09-05 MED ORDER — HYPROMELLOSE (GONIOSCOPIC) 2.5 % OP SOLN: 2.0000 [drp] | Freq: Three times a day (TID) | OPHTHALMIC | Status: DC

## 2020-09-05 MED ORDER — HALOPERIDOL LACTATE 5 MG/ML IJ SOLN
2.0000 mg | Freq: Four times a day (QID) | INTRAMUSCULAR | Status: DC | PRN
  Administered 2020-09-06 – 2020-09-14 (×3): 2 mg via INTRAVENOUS
  Filled 2020-09-05 (×3): qty 1

## 2020-09-05 NOTE — Plan of Care (Signed)
  Problem: Elimination: Goal: Will not experience complications related to urinary retention Outcome: Progressing   Problem: Safety: Goal: Ability to remain free from injury will improve Outcome: Progressing   Problem: Skin Integrity: Goal: Risk for impaired skin integrity will decrease Outcome: Progressing   

## 2020-09-05 NOTE — Progress Notes (Signed)
SLP Cancellation Note  Patient Details Name: Lori Ellis MRN: [id number] DOB: August 11, 1920   Cancelled treatment:       Reason Eval/Treat Not Completed: Patient not medically ready. Per RN, patient continues with agitation and is not following any commands, recommending SLP come back another date. SLP to check regarding patient's readiness for BSE next date.    Angela Nevin, MA, CCC-SLP Speech Therapy

## 2020-09-05 NOTE — Progress Notes (Signed)
[facility] PROGRESS NOTE   Lori Ellis BOF:[id number] DOB: 15-Jul-1920 DOA: 09/04/2020  PCP: Truett Perna, MD  Brief History/Interval Summary: 87 y.o. female, with PMH of essential hypertension, type 2 diabetes mellitus, peripheral neuropathy, spinal stenosis, chronic pain, hypothyroidism, vitamin D deficiency who presented to the ER on 09/04/2020 with worsening confusion.  Patient lives in an assisted living facility.  Was recently hospitalized for syncopal episode.  Found to have a urinary tract infection.  Was discharged on cefdinir.  Brought back by her son due to worsening mentation.   Consultants: None  Procedures: EEG has been ordered  Antibiotics: Anti-infectives (From admission, onward)   Start     Dose/Rate Route Frequency Ordered Stop   09/05/20 2100  cefTRIAXone (ROCEPHIN) 1 g in sodium chloride 0.9 % 100 mL IVPB        1 g 200 mL/hr over 30 Minutes Intravenous Every 24 hours 09/04/20 2334     09/04/20 2045  cefTRIAXone (ROCEPHIN) 1 g in sodium chloride 0.9 % 100 mL IVPB        1 g 200 mL/hr over 30 Minutes Intravenous  Once 09/04/20 2031 09/05/20 0027      Subjective/Interval History: Patient noted to be somnolent this morning.  Per nursing staff she was quite agitated overnight requiring mittens.  No history available at this time from the patient.  Patient's son was at the bedside.  2 weeks ago patient was able to transfer from bed to wheelchair and go to the bathroom and transfer herself onto the commode.  Does not walk much at baseline.  No known cognitive impairment.    Assessment/Plan:  Acute metabolic encephalopathy/recent vasovagal syncope Likely multifactorial including incompletely treated UTI, dehydration and acute kidney injury.  Patient did not exhibit any focal deficits.  CT head did not show any focal findings.  Patient was hospitalized a few days ago for syncope thought to be due to vasovagal.  At this time is reasonable to do further work-up.   Will order MRI brain as well as EEG.  TSH was recently checked and was noted to be normal.  Will check vitamin B12 and folic acid levels.  PT and OT evaluation.  Patient is from an assisted living facility.  At this time she is a not safe enough for discharge back to her usual setting.  She will likely need higher level of care.  Recently diagnosed UTI Urine cultures growing E. coli.  Noted to be pansensitive.  Continue ceftriaxone for now.  Follow-up on blood cultures.  Acute kidney injury on chronic kidney disease stage IIIb Likely dehydrated from poor oral intake.  Baseline creatinine around 1.  Lisinopril is on hold.  Continue with IV hydration.  Monitor urine output.  Recheck labs tomorrow.  Essential hypertension Continue home medications excluding lisinopril.  Monitor blood pressures.  Hypothyroidism TSH was 2.8 on 3/17.  Continue with levothyroxine.  Diabetes mellitus type 2, non-insulin-dependent HbA1c 6.6 when checked recently.  Monitor CBGs.  No SSI for Now.  Check glucose once a day.  Chronic low back pain/lumbar spinal stenosis Tramadol at home is being currently held.  PT and OT.  Gets around in a wheelchair.  History of peripheral neuropathy On gabapentin at home which is currently on hold due to her encephalopathy  Goals of care Long discussion with patient's son.  Prognosis is guarded at this time.  He was told that we are trying to determine if there are any reversible causes for her worsening mentation  apart from the UTI.  Depending on results of further testing and her progress we may need to make further decisions regarding disposition and care.   DVT Prophylaxis: Lovenox Code Status: DNR Family Communication: Discussed with the patient's son Disposition Plan: Likely need higher level of care.  Cannot go back to ALF in her current state.  Status is: Observation  The patient will require care spanning > 2 midnights and should be moved to inpatient because:  Altered mental status and Unsafe d/c plan  Dispo: The patient is from: ALF              Anticipated d/c is to: To be determined              Patient currently is not medically stable to d/c.   Difficult to place patient No      Medications:  Scheduled: . amLODipine  2.5 mg Oral QHS  . amLODipine  5 mg Oral q morning  . enoxaparin (LOVENOX) injection  30 mg Subcutaneous Q24H  . levothyroxine  75 mcg Oral QAC breakfast  . propranolol  20 mg Oral QHS  . propranolol  40 mg Oral Daily   Continuous: . cefTRIAXone (ROCEPHIN)  IV    . lactated ringers     WUJ:[id number] **OR** acetaminophen, albuterol, haloperidol lactate, ondansetron **OR** ondansetron (ZOFRAN) IV, polyethylene glycol   Objective:  Vital Signs  Vitals:   09/05/20 0121 09/05/20 0124 09/05/20 0551 09/05/20 1019  BP: (!) 154/77  (!) 135/59 139/65  Pulse: 67  66 71  Resp: Temp: 97.8 F (36.6 C)  98.2 F (36.8 C) 97.6 F (36.4 C)  TempSrc: Oral  Oral Oral  SpO2: 98%  98%   Weight:  57.1 kg    Height:   (1.6 m)      Intake/Output Summary (Last 24 hours) at 09/05/2020 1053 Last data filed at 09/05/2020 0200 Gross per 24 hour  Intake 0 ml  Output --  Net 0 ml   Filed Weights   09/04/20 1806 09/05/20 0124  Weight: 58.5 kg 57.1 kg    General appearance: Patient is somnolent..  Arousable to painful stimuli but goes right back to sleep. Dry oral mucous membranes Resp: Coarse breath sounds bilaterally.  Normal effort at rest. Cardio: S1-S2 is normal regular.  No S3-S4.  No rubs murmurs or bruit GI: Abdomen is soft.  Nontender nondistended.  Bowel sounds are present normal.  No masses organomegaly Extremities: No edema.  Neurologic: Noted to be moving all of her extremities.  Opens her eyes to pain.  Pupils are equal bilaterally.  No obvious focal deficits noted.   Lab Results:  Data Reviewed: I have personally reviewed following labs and imaging studies  CBC: Recent Labs  Lab  09/02/20 1200 09/02/20 2031 09/03/20 0836 09/04/20 1851 09/05/20 0555  WBC 8.3 10.2 9.9 12.1* 11.1*  NEUTROABS 5.3  --   --  7.1  --   HGB 11.1* 12.0 12.6 13.9 12.4  HCT 35.6* 37.9 39.9 43.8 39.8  MCV 96.2 95.2 94.3 94.6 95.9  PLT 150 208 198 273 231    Basic Metabolic Panel: Recent Labs  Lab 09/02/20 1200 09/02/20 1915 09/03/20 0836 09/04/20 1851 09/05/20 0555  NA 138  --  142 140 142  K 5.7* 4.2 3.7 3.6 3.5  CL 106  --  106 103 108  CO2 24  --  25 24 21*  GLUCOSE 182*  --  138* 130* 124*  BUN 32*  --  24* 33* 33*  CREATININE 1.38* 1.06* 1.07* 1.43* 1.24*  CALCIUM 8.8*  --  9.3 9.7 9.1    GFR: Estimated Creatinine Clearance: 21.5 mL/min (A) (by C-G formula based on SCr of 1.24 mg/dL (H)).  Liver Function Tests: Recent Labs  Lab 09/02/20 1200 09/04/20 1851 09/05/20 0555  AST 34 29 26  ALT 18 16 14   ALKPHOS 64 80 69  BILITOT 0.9 0.9 0.8  PROT 7.0 8.4* 7.1  ALBUMIN 3.7 4.6 3.9     HbA1C: Recent Labs    09/02/20 1915  HGBA1C 6.6*    CBG: Recent Labs  Lab 09/04/20 1835  GLUCAP 137*     Thyroid Function Tests: Recent Labs    09/02/20 1915  TSH 2.819      Recent Results (from the past 240 hour(s))  SARS CORONAVIRUS 2 (TAT 6-24 HRS) Nasopharyngeal Nasopharyngeal Swab     Status: None   Collection Time: 09/02/20 12:00 PM   Specimen: Nasopharyngeal Swab  Result Value Ref Range Status   SARS Coronavirus 2 NEGATIVE NEGATIVE Final    Comment: (NOTE) SARS-CoV-2 target nucleic acids are NOT DETECTED.  The SARS-CoV-2 RNA is generally detectable in upper and lower respiratory specimens during the acute phase of infection. Negative results do not preclude SARS-CoV-2 infection, do not rule out co-infections with other pathogens, and should not be used as the sole basis for treatment or other patient management decisions. Negative results must be combined with clinical observations, patient history, and epidemiological information. The  expected result is Negative.  Fact Sheet for Patients: 09/04/20  Fact Sheet for Healthcare Providers: HairSlick.no  This test is not yet approved or cleared by the [place]ir.[place] FDA and  has been authorized for detection and/or diagnosis of SARS-CoV-2 by FDA under an Emergency Use Authorization (EUA). This EUA will remain  in effect (meaning this test can be used) for the duration of the COVID-19 declaration under Se ction 564(b)(1) of the Act, 21 U.S.C. section 360bbb-3(b)(1), unless the authorization is terminated or revoked sooner.  Performed at [facility], [address] [place], [place]   Culture, Urine     Status: Abnormal (Preliminary result)   Collection Time: 09/02/20  7:15 PM   Specimen: Urine, Random  Result Value Ref Range Status   Specimen Description   Final    URINE, RANDOM Performed at [facility], [address], [place], [place]    Special Requests   Final    NONE Performed at [facility], [address] [place], [place]    Culture (A)  Final    >=100,000 COLONIES/mL ESCHERICHIA COLI SUSCEPTIBILITIES TO FOLLOW Performed at [facility], [address] [place], [place]    Report Status PENDING  Incomplete  Resp Panel by RT-PCR (Flu A&B, Covid) Nasopharyngeal Swab     Status: None   Collection Time: 09/04/20  9:25 PM   Specimen: Nasopharyngeal Swab; Nasopharyngeal(NP) swabs in vial transport medium  Result Value Ref Range Status   SARS Coronavirus 2 by RT PCR NEGATIVE NEGATIVE Final    Comment: (NOTE) SARS-CoV-2 target nucleic acids are NOT DETECTED.  The SARS-CoV-2 RNA is generally detectable in upper respiratory specimens during the acute phase of infection. The lowest concentration of SARS-CoV-2 viral copies this assay can detect is 138 copies/mL. A negative result does not preclude  SARS-Cov-2 infection and should not be used as the sole basis for  treatment or other patient management decisions. A negative result may occur with  improper specimen collection/handling, submission of specimen other than nasopharyngeal swab, presence of viral mutation(s) within the areas targeted by this assay, and inadequate number of viral copies(<138 copies/mL). A negative result must be combined with clinical observations, patient history, and epidemiological information. The expected result is Negative.  Fact Sheet for Patients:  BloggerCourse.comhttps://www.fda.gov/media/152166/download  Fact Sheet for Healthcare Providers:  SeriousBroker.ithttps://www.fda.gov/media/152162/download  This test is no t yet approved or cleared by the [place] FDA and  has been authorized for detection and/or diagnosis of SARS-CoV-2 by FDA under an Emergency Use Authorization (EUA). This EUA will remain  in effect (meaning this test can be used) for the duration of the COVID-19 declaration under Section 564(b)(1) of the Act, 21 U.S.C.section 360bbb-3(b)(1), unless the authorization is terminated  or revoked sooner.       Influenza A by PCR NEGATIVE NEGATIVE Final   Influenza B by PCR NEGATIVE NEGATIVE Final    Comment: (NOTE) The Xpert Xpress SARS-CoV-2/FLU/RSV plus assay is intended as an aid in the diagnosis of influenza from Nasopharyngeal swab specimens and should not be used as a sole basis for treatment. Nasal washings and aspirates are unacceptable for Xpert Xpress SARS-CoV-2/FLU/RSV testing.  Fact Sheet for Patients: BloggerCourse.comhttps://www.fda.gov/media/152166/download  Fact Sheet for Healthcare Providers: SeriousBroker.ithttps://www.fda.gov/media/152162/download  This test is not yet approved or cleared by the [place] FDA and has been authorized for detection and/or diagnosis of SARS-CoV-2 by FDA under an Emergency Use Authorization (EUA). This EUA will remain in effect (meaning this test can be used) for the duration of  the COVID-19 declaration under Section 564(b)(1) of the Act, 21 U.S.C. section 360bbb-3(b)(1), unless the authorization is terminated or revoked.  Performed at [facility], [address] [place], [place] [address]   Blood culture (routine x 2)     Status: None (Preliminary result)   Collection Time: 09/04/20  9:40 PM   Specimen: BLOOD  Result Value Ref Range Status   Specimen Description   Final    BLOOD SITE NOT SPECIFIED Performed at [facility], [address] [place], [place] [address]    Special Requests   Final    BOTTLES DRAWN AEROBIC AND ANAEROBIC Blood Culture adequate volume Performed at [facility], [address] [place], [place] [address]    Culture PENDING  Incomplete   Report Status PENDING  Incomplete      Radiology Studies: CT HEAD WO CONTRAST  Result Date: 09/04/2020 CLINICAL DATA:  Altered mental status. EXAM: CT HEAD WITHOUT CONTRAST TECHNIQUE: Contiguous axial images were obtained from the base of the skull through the vertex without intravenous contrast. COMPARISON:  CT head dated 06/19/2020. FINDINGS: Brain: No evidence of acute infarction, hemorrhage, hydrocephalus, extra-axial collection or mass lesion/mass effect. There is mild cerebral volume loss with associated ex vacuo dilatation. Periventricular white matter hypoattenuation likely represents chronic small vessel ischemic disease. Vascular: There are vascular calcifications in the carotid siphons. Skull: Normal. Negative for fracture or focal lesion. Sinuses/Orbits: No acute finding. Other: None. IMPRESSION: No acute intracranial process. Electronically Signed   By: Romona Curlsyler  Litton M.D.   On: 09/04/2020 18:51   DG Chest Port 1 View  Result Date: 09/04/2020 CLINICAL DATA:  87 year old female with altered mental status. EXAM: PORTABLE CHEST 1 VIEW COMPARISON:  Chest radiograph dated 09/02/2020. FINDINGS: Probable left lung base atelectasis. No focal  consolidation, pleural effusion or pneumothorax. Stable cardiomegaly. Atherosclerotic calcification of the aorta.  Osteopenia with degenerative changes of the spine and shoulders. No acute osseous pathology. IMPRESSION: No active disease. Electronically Signed   By: Elgie Collard M.D.   On: 09/04/2020 21:23       LOS: 0 days   Shanieka Blea Rito Ehrlich  [facility] Pager on [email or web address]  09/05/2020, 10:53 AM

## 2020-09-05 NOTE — ED Notes (Signed)
Pt  Had issues following instructions to drink water.  Pt was fine with apple sauce. RN verified BP meds could be crushed; gabapentin was administered by emptying capsule in apple sauce. Pt tolerated well.

## 2020-09-05 NOTE — ED Notes (Signed)
ED TO INPATIENT HANDOFF REPORT  Name/Age/Gender Lori Ellis 87 y.o. female  Code Status    Code Status Orders  (From admission, onward)         Start     Ordered   09/04/20 2330  Do not attempt resuscitation (DNR)  Continuous       Question Answer Comment  In the event of cardiac or respiratory ARREST Do not call a "code blue"   In the event of cardiac or respiratory ARREST Do not perform Intubation, CPR, defibrillation or ACLS   In the event of cardiac or respiratory ARREST Use medication by any route, position, wound care, and other measures to relive pain and suffering. May use oxygen, suction and manual treatment of airway obstruction as needed for comfort.      09/04/20 2334        Code Status History    Date Active Date Inactive Code Status Order ID Comments User Context   09/02/2020 1813 09/03/2020 1751 DNR [id number]  [place], Eric J, DO ED   01/01/2019 2009 01/10/2019 1544 DNR [id number]  Jacques Navy, MD ED   01/01/2019 1932 01/01/2019 2009 Full Code [id number]  Jacques Navy, MD ED   06/12/2017 1222 06/21/2017 1742 DNR [id number]  Eulah Pont, MD ED   Advance Care Planning Activity    Advance Directive Documentation   Flowsheet Row Most Recent Value  Type of Advance Directive Living will, Healthcare Power of Attorney  Pre-existing out of facility DNR order (yellow form or pink MOST form) --  "MOST" Form in Place? --      Home/SNF/Other Nursing Home  Chief Complaint Metabolic encephalopathy [G93.41]  Level of Care/Admitting Diagnosis ED Disposition    ED Disposition Condition Comment   Admit  Hospital Area: [facility] [[id number]]  Level of Care: Med-Surg [16]  Covid Evaluation: Asymptomatic Screening Protocol (No Symptoms)  Diagnosis: Metabolic encephalopathy [348.31.ICD-9-CM]  Admitting Physician: Laqueta Due [[id number]]  Attending Physician: Laqueta Due [[id number]]       Medical History Past Medical History:   Diagnosis Date  . Degenerative disc disease, lumbar   . Diabetes mellitus without complication (HCC)   . Hypertension   . Renal disorder   . Thyroid disease     Allergies Allergies  Allergen Reactions  . Fentanyl Other (See Comments)    Severe delusions    . Penicillins Hives and Rash    Did it involve swelling of the face/tongue/throat, SOB, or low BP? Unk Did it involve sudden or severe rash/hives, skin peeling, or any reaction on the inside of your mouth or nose? Unk Did you need to seek medical attention at a hospital or doctor's office? Unk When did it last happen? "A long time ago" If all above answers are "NO", may proceed with cephalosporin use.   . Tetanus Toxoid Hives  . Adhesive [Tape] Other (See Comments)    TEARS THE SKIN!!  . Advil [Ibuprofen] Other (See Comments)    Was told by MD to not take  . Hydrochlorothiazide Other (See Comments)    Hyponatremia   . Naproxen Sodium Rash    IV Location/Drains/Wounds Patient Lines/Drains/Airways Status    Active Line/Drains/Airways    Name Placement date Placement time Site Days   Peripheral IV 09/04/20 Right Forearm 09/04/20  1908  Forearm  1   External Urinary Catheter 09/02/20  1146  --  3          Labs/Imaging Results for orders placed or  performed during the hospital encounter of 09/04/20 (from the past 48 hour(s))  CBG monitoring, ED     Status: Abnormal   Collection Time: 09/04/20  6:35 PM  Result Value Ref Range   Glucose-Capillary 137 (H) 70 - 99 mg/dL    Comment: Glucose reference range applies only to samples taken after fasting for at least 8 hours.  Comprehensive metabolic panel     Status: Abnormal   Collection Time: 09/04/20  6:51 PM  Result Value Ref Range   Sodium 140 135 - 145 mmol/L   Potassium 3.6 3.5 - 5.1 mmol/L   Chloride 103 98 - 111 mmol/L   CO2 24 22 - 32 mmol/L   Glucose, Bld 130 (H) 70 - 99 mg/dL    Comment: Glucose reference range applies only to samples taken after fasting  for at least 8 hours.   BUN 33 (H) 8 - 23 mg/dL   Creatinine, Ser 1.611.43 (H) 0.44 - 1.00 mg/dL   Calcium 9.7 8.9 - 09.610.3 mg/dL   Total Protein 8.4 (H) 6.5 - 8.1 g/dL   Albumin 4.6 3.5 - 5.0 g/dL   AST 29 15 - 41 U/L   ALT 16 0 - 44 U/L   Alkaline Phosphatase 80 38 - 126 U/L   Total Bilirubin 0.9 0.3 - 1.2 mg/dL   GFR, Estimated 33 (L) >60 mL/min    Comment: (NOTE) Calculated using the CKD-EPI Creatinine Equation (2021)    Anion gap 13 5 - 15    Comment: Performed at [facility], [address] [place], [place] [address]  CBC WITH DIFFERENTIAL     Status: Abnormal   Collection Time: 09/04/20  6:51 PM  Result Value Ref Range   WBC 12.1 (H) 4.0 - 10.5 K/uL   RBC 4.63 3.87 - 5.11 MIL/uL   Hemoglobin 13.9 12.0 - 15.0 g/dL   HCT 98.143.8 19.136.0 - 47.846.0 %   MCV 94.6 80.0 - 100.0 fL   MCH 30.0 26.0 - 34.0 pg   MCHC 31.7 30.0 - 36.0 g/dL   RDW 29.514.5 62.111.5 - 30.815.5 %   Platelets 273 150 - 400 K/uL   nRBC 0.0 0.0 - 0.2 %   Neutrophils Relative % 59 %   Neutro Abs 7.1 1.7 - 7.7 K/uL   Lymphocytes Relative 29 %   Lymphs Abs 3.6 0.7 - 4.0 K/uL   Monocytes Relative 9 %   Monocytes Absolute 1.1 (H) 0.1 - 1.0 K/uL   Eosinophils Relative 2 %   Eosinophils Absolute 0.3 0.0 - 0.5 K/uL   Basophils Relative 1 %   Basophils Absolute 0.1 0.0 - 0.1 K/uL   Immature Granulocytes 0 %   Abs Immature Granulocytes 0.03 0.00 - 0.07 K/uL    Comment: Performed at [facility], [address] [place], [place] [address]  Urinalysis, Complete w Microscopic     Status: Abnormal   Collection Time: 09/04/20  6:51 PM  Result Value Ref Range   Color, Urine AMBER (A) YELLOW    Comment: BIOCHEMICALS MAY BE AFFECTED BY COLOR   APPearance CLOUDY (A) CLEAR   Specific Gravity, Urine 1.018 1.005 - 1.030   pH 5.0 5.0 - 8.0   Glucose, UA NEGATIVE NEGATIVE mg/dL   Hgb urine dipstick NEGATIVE NEGATIVE   Bilirubin Urine NEGATIVE NEGATIVE   Ketones, ur 5 (A) NEGATIVE mg/dL   Protein, ur 696100  (A) NEGATIVE mg/dL   Nitrite NEGATIVE NEGATIVE   Leukocytes,Ua LARGE (A) NEGATIVE  RBC / HPF 0-5 0 - 5 RBC/hpf   WBC, UA >50 (H) 0 - 5 WBC/hpf   Bacteria, UA FEW (A) NONE SEEN   WBC Clumps PRESENT    Amorphous Crystal PRESENT     Comment: Performed at [facility], [address], [place], [place] [address]  Lactic acid, plasma     Status: None   Collection Time: 09/04/20  8:30 PM  Result Value Ref Range   Lactic Acid, Venous 1.3 0.5 - 1.9 mmol/L    Comment: Performed at [facility], [address] [place], [place] [address]  Troponin I (High Sensitivity)     Status: Abnormal   Collection Time: 09/04/20  8:38 PM  Result Value Ref Range   Troponin I (High Sensitivity) 25 (H) <18 ng/L    Comment: (NOTE) Elevated high sensitivity troponin I (hsTnI) values and significant  changes across serial measurements may suggest ACS but many other  chronic and acute conditions are known to elevate hsTnI results.  Refer to the "Links" section for chest pain algorithms and additional  guidance. Performed at [facility], [address], [place], [place] [address]   Resp Panel by RT-PCR (Flu A&B, Covid) Nasopharyngeal Swab     Status: None   Collection Time: 09/04/20  9:25 PM   Specimen: Nasopharyngeal Swab; Nasopharyngeal(NP) swabs in vial transport medium  Result Value Ref Range   SARS Coronavirus 2 by RT PCR NEGATIVE NEGATIVE    Comment: (NOTE) SARS-CoV-2 target nucleic acids are NOT DETECTED.  The SARS-CoV-2 RNA is generally detectable in upper respiratory specimens during the acute phase of infection. The lowest concentration of SARS-CoV-2 viral copies this assay can detect is 138 copies/mL. A negative result does not preclude SARS-Cov-2 infection and should not be used as the sole basis for treatment or other patient management decisions. A negative result may occur with  improper specimen collection/handling, submission of  specimen other than nasopharyngeal swab, presence of viral mutation(s) within the areas targeted by this assay, and inadequate number of viral copies(<138 copies/mL). A negative result must be combined with clinical observations, patient history, and epidemiological information. The expected result is Negative.  Fact Sheet for Patients:  BloggerCourse.com  Fact Sheet for Healthcare Providers:  SeriousBroker.it  This test is no t yet approved or cleared by the [place] FDA and  has been authorized for detection and/or diagnosis of SARS-CoV-2 by FDA under an Emergency Use Authorization (EUA). This EUA will remain  in effect (meaning this test can be used) for the duration of the COVID-19 declaration under Section 564(b)(1) of the Act, 21 U.S.C.section 360bbb-3(b)(1), unless the authorization is terminated  or revoked sooner.       Influenza A by PCR NEGATIVE NEGATIVE   Influenza B by PCR NEGATIVE NEGATIVE    Comment: (NOTE) The Xpert Xpress SARS-CoV-2/FLU/RSV plus assay is intended as an aid in the diagnosis of influenza from Nasopharyngeal swab specimens and should not be used as a sole basis for treatment. Nasal washings and aspirates are unacceptable for Xpert Xpress SARS-CoV-2/FLU/RSV testing.  Fact Sheet for Patients: BloggerCourse.com  Fact Sheet for Healthcare Providers: SeriousBroker.it  This test is not yet approved or cleared by the [place] FDA and has been authorized for detection and/or diagnosis of SARS-CoV-2 by FDA under an Emergency Use Authorization (EUA). This EUA will remain in effect (meaning this test can be used) for the duration of the COVID-19 declaration under Section 564(b)(1) of the Act, 21  U.S.C. section 360bbb-3(b)(1), unless the authorization is terminated or revoked.  Performed at [facility], [address] [place], [place] [address]    CT HEAD WO CONTRAST  Result Date: 09/04/2020 CLINICAL DATA:  Altered mental status. EXAM: CT HEAD WITHOUT CONTRAST TECHNIQUE: Contiguous axial images were obtained from the base of the skull through the vertex without intravenous contrast. COMPARISON:  CT head dated 06/19/2020. FINDINGS: Brain: No evidence of acute infarction, hemorrhage, hydrocephalus, extra-axial collection or mass lesion/mass effect. There is mild cerebral volume loss with associated ex vacuo dilatation. Periventricular white matter hypoattenuation likely represents chronic small vessel ischemic disease. Vascular: There are vascular calcifications in the carotid siphons. Skull: Normal. Negative for fracture or focal lesion. Sinuses/Orbits: No acute finding. Other: None. IMPRESSION: No acute intracranial process. Electronically Signed   By: Romona Curls M.D.   On: 09/04/2020 18:51   DG Chest Port 1 View  Result Date: 09/04/2020 CLINICAL DATA:  87 year old female with altered mental status. EXAM: PORTABLE CHEST 1 VIEW COMPARISON:  Chest radiograph dated 09/02/2020. FINDINGS: Probable left lung base atelectasis. No focal consolidation, pleural effusion or pneumothorax. Stable cardiomegaly. Atherosclerotic calcification of the aorta. Osteopenia with degenerative changes of the spine and shoulders. No acute osseous pathology. IMPRESSION: No active disease. Electronically Signed   By: Elgie Collard M.D.   On: 09/04/2020 21:23    Pending Labs Unresulted Labs (From admission, onward)          Start     Ordered   09/05/20 0500  Comprehensive metabolic panel  Tomorrow morning,   R        09/04/20 2334   09/05/20 0500  CBC  Tomorrow morning,   R        09/04/20 2334   09/04/20 2031  Blood culture (routine x 2)  BLOOD CULTURE X 2,   STAT      09/04/20 2031   09/04/20 2000  Urine Culture  Once,   STAT        09/04/20 1959          Vitals/Pain Today's Vitals   09/04/20 2129 09/04/20 2131  09/05/20 0012 09/05/20 0022  BP:  (!) 156/72  135/66  Pulse:  75    Resp:  18    Temp: (!) 96.6 F (35.9 C)  97.9 F (36.6 C)   TempSrc: Rectal  Oral   SpO2:  98%    Weight:      Height:      PainSc:        Isolation Precautions No active isolations  Medications Medications  levothyroxine (SYNTHROID) tablet 75 mcg (has no administration in time range)  melatonin tablet 3 mg (has no administration in time range)  gabapentin (NEURONTIN) capsule 300 mg (300 mg Oral Given 09/05/20 0024)  cholecalciferol (VITAMIN D3) tablet 2,000 Units (has no administration in time range)  propranolol (INDERAL) tablet 20 mg (20 mg Oral Given 09/05/20 0026)  amLODipine (NORVASC) tablet 2.5 mg (2.5 mg Oral Given 09/05/20 0022)  amLODipine (NORVASC) tablet 5 mg (has no administration in time range)  acetaminophen (TYLENOL) tablet 650 mg (has no administration in time range)    Or  acetaminophen (TYLENOL) suppository 650 mg (has no administration in time range)  polyethylene glycol (MIRALAX / GLYCOLAX) packet 17 g (has no administration in time range)  ondansetron (ZOFRAN) tablet 4 mg (has no administration in time range)    Or  ondansetron (ZOFRAN) injection 4 mg (has no administration in time range)  albuterol (  PROVENTIL) (2.5 MG/3ML) 0.083% nebulizer solution 2.5 mg (has no administration in time range)  cefTRIAXone (ROCEPHIN) 1 g in sodium chloride 0.9 % 100 mL IVPB (has no administration in time range)  enoxaparin (LOVENOX) injection 30 mg (has no administration in time range)  lactated ringers infusion (has no administration in time range)  propranolol (INDERAL) tablet 40 mg (has no administration in time range)  sodium chloride 0.9 % bolus 1,000 mL (0 mLs Intravenous Stopped 09/05/20 0027)  cefTRIAXone (ROCEPHIN) 1 g in sodium chloride 0.9 % 100 mL IVPB (0 g Intravenous Stopped 09/05/20 0027)    Mobility non-ambulatory

## 2020-09-05 NOTE — Progress Notes (Signed)
OT Cancellation Note  Patient Details Name: Lori Ellis MRN: [id number] DOB: 06-03-1921   Cancelled Treatment:    Reason Eval/Treat Not Completed: Patient's level of consciousness: Nursing requests that pt not be disturbed due to ongoing agitation when awake, and pt finally sleeping peacefully.  Nursing reports that when awake, pt is following no commands, agitated and aggressive with need of hand mitts due to attempts to pull out lines or swing at staff.   Pt may not be appropriate for OT at this time but will continue efforts to Evaluate at a later date when pt may be better able to participate.   Theodoro Clock 09/05/2020, 1:13 PM

## 2020-09-05 NOTE — Progress Notes (Signed)
PT Cancellation Note  Patient Details Name: Lori Ellis MRN: [id number] DOB: 1921/01/15   Cancelled Treatment:       Reason Eval/Treat Not Completed: Patient's level of consciousness: Nursing requests that pt not be disturbed due to ongoing agitation when awake, and pt finally sleeping peacefully.  Nursing reports that when awake, pt is following no commands, agitated and aggressive with need of hand mitts due to attempts to pull out lines or swing at staff.  Will follow and attempt eval as appropriate based on pt condition.    Osmar Howton 09/05/2020, 1:18 PM

## 2020-09-06 ENCOUNTER — Inpatient Hospital Stay (HOSPITAL_COMMUNITY)
Admit: 2020-09-06 | Discharge: 2020-09-06 | Disposition: A | Discharge status: Home / Self Care | Payer: PPO | Attending: Internal Medicine | Admitting: Internal Medicine

## 2020-09-06 DIAGNOSIS — N179 Acute kidney failure, unspecified: Secondary | ICD-10-CM | POA: Diagnosis not present

## 2020-09-06 DIAGNOSIS — I10 Essential (primary) hypertension: Secondary | ICD-10-CM | POA: Diagnosis not present

## 2020-09-06 DIAGNOSIS — N3000 Acute cystitis without hematuria: Secondary | ICD-10-CM

## 2020-09-06 LAB — COMPREHENSIVE METABOLIC PANEL
ALT: 14 U/L (ref 0–44)
AST: 21 U/L (ref 15–41)
Albumin: 4 g/dL (ref 3.5–5.0)
Alkaline Phosphatase: 74 U/L (ref 38–126)
Anion gap: 14 (ref 5–15)
BUN: 39 mg/dL — ABNORMAL HIGH (ref 8–23)
CO2: 19 mmol/L — ABNORMAL LOW (ref 22–32)
Calcium: 9.3 mg/dL (ref 8.9–10.3)
Chloride: 109 mmol/L (ref 98–111)
Creatinine, Ser: 1.31 mg/dL — ABNORMAL HIGH (ref 0.44–1.00)
GFR, Estimated: 37 mL/min — ABNORMAL LOW (ref >60)
Glucose, Bld: 134 mg/dL — ABNORMAL HIGH (ref 70–99)
Potassium: 3.6 mmol/L (ref 3.5–5.1)
Sodium: 142 mmol/L (ref 135–145)
Total Bilirubin: 1.3 mg/dL — ABNORMAL HIGH (ref 0.3–1.2)
Total Protein: 7.3 g/dL (ref 6.5–8.1)

## 2020-09-06 LAB — CBC
HCT: 42.8 % (ref 36.0–46.0)
Hemoglobin: 12.9 g/dL (ref 12.0–15.0)
MCH: 30.3 pg (ref 26.0–34.0)
MCHC: 30.1 g/dL (ref 30.0–36.0)
MCV: 100.5 fL — ABNORMAL HIGH (ref 80.0–100.0)
Platelets: 171 10*3/uL (ref 150–400)
RBC: 4.26 MIL/uL (ref 3.87–5.11)
RDW: 14.6 % (ref 11.5–15.5)
WBC: 10.4 10*3/uL (ref 4.0–10.5)
nRBC: 0 % (ref 0.0–0.2)

## 2020-09-06 LAB — FOLATE: Folate: 16.4 ng/mL (ref >5.9)

## 2020-09-06 LAB — VITAMIN B12: Vitamin B-12: 450 pg/mL (ref 180–914)

## 2020-09-06 MED ORDER — SODIUM CHLORIDE 0.45 % IV SOLN: INTRAVENOUS | Status: DC

## 2020-09-06 MED ORDER — ENSURE ENLIVE PO LIQD
237.0000 mL | Freq: Two times a day (BID) | ORAL | Status: DC
  Administered 2020-09-06 – 2020-09-15 (×11): 237 mL via ORAL

## 2020-09-06 NOTE — TOC Initial Note (Signed)
Transition of Care [facility]) - Initial/Assessment Note    Patient Details  Name: Lori Ellis MRN: [id number] Date of Birth: 1921-02-27  Transition of Care (TOC) CM/SW Contact:    Armanda Heritage, RN Phone Number: 09/06/2020, 9:57 AM  Clinical Narrative:                 CM spoke with patient's son [name] who reports patient from [facility], reports prior to illness patient was able to transfer self, feed self, assist with dressing.  Son reports patient has been doing poorly since 09/02/20.  Son reports he is hopeful for patient to get some rehab after hospitalization, states has been at [facility] in the past and would like for her to go there again id possible.  CM discussed placement process, including need to wait for PT eval.  Discussed that CM cannot guarantee that [facility] will have bed availability.  Son understands this and understands patient would need to be faxed out to all area facilities for bed offers should SNF be recommended as next level of care. TOC will continue to follow.  Expected Discharge Plan: Assisted Living Barriers to Discharge: No Barriers Identified   Patient Goals and CMS Choice Patient states their goals for this hospitalization and ongoing recovery are:: per son, would like for patient to go to rehab- [facility] if possible CMS Medicare.gov Compare Post Acute Care list provided to:: Patient Represenative (must comment) Choice offered to / list presented to : Adult Children  Expected Discharge Plan and Services Expected Discharge Plan: Assisted Living       Living arrangements for the past 2 months: Assisted Living Facility                                      Prior Living Arrangements/Services Living arrangements for the past 2 months: Assisted Living Facility Lives with:: Facility Resident Patient language and need for interpreter reviewed:: Yes Do you feel safe going back to the place where you live?: Yes      Need for Family  Participation in Patient Care: Yes (Comment) Care giver support system in place?: Yes (comment)   Criminal Activity/Legal Involvement Pertinent to Current Situation/Hospitalization: No - Comment as needed  Activities of Daily Living Home Assistive Devices/Equipment: Wheelchair,Dentures (specify type) ADL Screening (condition at time of admission) Patient's cognitive ability adequate to safely complete daily activities?: No Is the patient deaf or have difficulty hearing?: Yes Does the patient have difficulty seeing, even when wearing glasses/contacts?: Yes Does the patient have difficulty concentrating, remembering, or making decisions?: Yes Patient able to express need for assistance with ADLs?: Yes Does the patient have difficulty dressing or bathing?: Yes Independently performs ADLs?: No Communication: Independent Dressing (OT): Needs assistance Is this a change from baseline?: Pre-admission baseline Grooming: Needs assistance Is this a change from baseline?: Pre-admission baseline Feeding: Needs assistance Is this a change from baseline?: Change from baseline, expected to last <3 days Bathing: Needs assistance Is this a change from baseline?: Pre-admission baseline Toileting: Needs assistance Is this a change from baseline?: Pre-admission baseline In/Out Bed: Needs assistance Is this a change from baseline?: Pre-admission baseline Walks in Home: Dependent Is this a change from baseline?: Pre-admission baseline Does the patient have difficulty walking or climbing stairs?: Yes Weakness of Legs: Both Weakness of Arms/Hands: Both  Permission Sought/Granted  Emotional Assessment       Orientation: : Fluctuating Orientation (Suspected and/or reported Sundowners)   Psych Involvement: No (comment)  Admission diagnosis:  Metabolic encephalopathy [G93.41] Encephalopathy [G93.40] Acute cystitis without hematuria [N30.00] Acute metabolic encephalopathy  [G93.41] Patient Active Problem List   Diagnosis Date Noted  . Acute metabolic encephalopathy 09/05/2020  . Metabolic encephalopathy 09/04/2020  . AKI (acute kidney injury) (HCC) 09/04/2020  . Acute lower UTI 09/03/2020  . Lethargy 01/08/2019  . Closed intertrochanteric fracture, right, initial encounter (HCC) 01/01/2019  . Atrial flutter by electrocardiogram (HCC) 01/01/2019  . Benign essential HTN   . Spinal stenosis, lumbar 06/12/2017  . Chronic bilateral low back pain with bilateral sciatica 03/07/2016  . Spondylolisthesis of lumbar region 03/07/2016  . CKD (chronic kidney disease) stage 3, GFR 30-59 ml/min (HCC) 03/12/2014  . Hypothyroidism 09/02/2013  . Type II diabetes mellitus with renal manifestations (HCC) 09/02/2013  . DDD (degenerative disc disease), lumbosacral 09/01/2013   PCP:  Truett Perna, MD Pharmacy:  No Pharmacies Listed    Social Determinants of Health (SDOH) Interventions    Readmission Risk Interventions No flowsheet data found.

## 2020-09-06 NOTE — Progress Notes (Signed)
PT Cancellation Note  Patient Details Name: Lori Ellis MRN: [id number] DOB: 1921/01/17   Cancelled Treatment:    Reason Eval/Treat Not Completed: Fatigue/lethargy limiting ability to participate , had Ativan for EEG.  Rada Hay 09/06/2020, 3:49 PM  Blanchard Kelch PT Acute Rehabilitation Services Pager [phone] Office [phone]

## 2020-09-06 NOTE — Progress Notes (Signed)
EEG Completed; Results Pending  

## 2020-09-06 NOTE — Evaluation (Signed)
Clinical/Bedside Swallow Evaluation Patient Details  Name: Lori Ellis MRN: [id number] Date of Birth: May 29, 1921  Today's Date: 09/06/2020 Time: SLP Start Time (ACUTE ONLY): 1115 SLP Stop Time (ACUTE ONLY): 1150 SLP Time Calculation (min) (ACUTE ONLY): 35 min  Past Medical History:  Past Medical History:  Diagnosis Date  . Degenerative disc disease, lumbar   . Diabetes mellitus without complication (HCC)   . Hypertension   . Renal disorder   . Thyroid disease    Past Surgical History:  Past Surgical History:  Procedure Laterality Date  . BLADDER SURGERY    . CHOLECYSTECTOMY    . INTRAMEDULLARY (IM) NAIL INTERTROCHANTERIC Right 01/02/2019   Procedure: INTRAMEDULLARY (IM) NAIL INTERTROCHANTRIC right hip;  Surgeon: Myrene Galas, MD;  Location: [facility] OR;  Service: Orthopedics;  Laterality: Right;   HPI:  Patient is a 87 y.o. female with PMH: essential HTN, DM-2, peripheral neuropathy, spinal stenosis, chronic pain, hypothryroidism, vitamin D deficiency who presented to ER from SNF on 3/19 with metabolic encephalopathy. MRI revealed No acute or reversible finding. Atrophy and small-vessel ischemic changes throughout the brain. CXR revealed probable left lung atelectasis, no focal consolidation, pleural effusion or pnemothorax.   Assessment / Plan / Recommendation Clinical Impression  Patient presents with a mild-mod oral dysphagia with suspected cognitive component. She was alert and able to accept PO's, opening mouth in anticipation of SLP feeding her spoonfuls of ice cream. Anticipation and oral preparatory phase during cup sips of thin liquids was suboptimal and SLP had to take care not to spill liquids into her mouth. Patient's pain limited degree at which [facility] could be raised and so she was slightly reclined during PO intake. No coughing, throat clearing or other overt s/s aspiraiton or penetration were observed and patient's voice remained clear throughout session. Her son arrived  during this evaluation and he was able to provide some baseline. Per son, patient was able to eat regular foods at her SNF and she "gained weight", though she did not always eat her meals. During this evaluation, patinet consumed a 4 ounce container of vanilla ice cream and took 3 small cup sips of water. Plan to initiate oral diet with full supervision and assist for feeding. SLP Visit Diagnosis: Dysphagia, unspecified (R13.10)    Aspiration Risk  Mild aspiration risk    Diet Recommendation Thin liquid;Other (Comment) (full liquids)   Liquid Administration via: Cup;Straw;Spoon Medication Administration: Crushed with puree Supervision: Full supervision/cueing for compensatory strategies;Staff to assist with self feeding Compensations: Small sips/bites;Slow rate Postural Changes: Other (Comment) (seated upright as much as patient tolerates)    Other  Recommendations Oral Care Recommendations: Oral care BID;Staff/trained caregiver to provide oral care   Follow up Recommendations Skilled Nursing facility      Frequency and Duration min 2x/week  1 week       Prognosis Prognosis for Safe Diet Advancement: Good      Swallow Study   General Date of Onset: 09/06/20 HPI: Patient is a 87 y.o. female with PMH: essential HTN, DM-2, peripheral neuropathy, spinal stenosis, chronic pain, hypothryroidism, vitamin D deficiency who presented to ER from SNF on 3/19 with metabolic encephalopathy. MRI revealed No acute or reversible finding. Atrophy and small-vessel ischemic changes throughout the brain. CXR revealed probable left lung atelectasis, no focal consolidation, pleural effusion or pnemothorax. Previous Swallow Assessment: None found Diet Prior to this Study: NPO Temperature Spikes Noted: Yes (99.6) Respiratory Status: Room air History of Recent Intubation: No Behavior/Cognition: Alert;Cooperative;Pleasant mood;Confused Oral Cavity Assessment: Within Functional  Limits Oral Care Completed  by SLP: Yes Oral Cavity - Dentition: Dentures, top Vision: Impaired for self-feeding Self-Feeding Abilities: Total assist Patient Positioning: Partially reclined Baseline Vocal Quality: Normal;Other (comment) (vocal tremor) Volitional Cough: Cognitively unable to elicit Volitional Swallow: Unable to elicit    Oral/Motor/Sensory Function Overall Oral Motor/Sensory Function: Generalized oral weakness Facial ROM: Within Functional Limits Facial Symmetry: Within Functional Limits Facial Strength: Within Functional Limits Lingual ROM: Within Functional Limits Lingual Symmetry: Within Functional Limits Lingual Strength: Reduced   Ice Chips     Thin Liquid Thin Liquid: Impaired Presentation: Spoon Oral Phase Impairments: Poor awareness of bolus Pharyngeal  Phase Impairments: Other (comments) (no overt s/s)    Nectar Thick     Honey Thick     Puree Puree: Impaired Oral Phase Functional Implications: Prolonged oral transit Pharyngeal Phase Impairments: Other (comments) (no overt s/s pharyngeal phase dysphagia)   Solid     Solid: Not tested      Angela Nevin, MA, CCC-SLP Speech Therapy

## 2020-09-06 NOTE — Progress Notes (Signed)
[facility] PROGRESS NOTE   Lori PateDoris B Ellis ZOX:[id number] DOB: 10/15/1920 DOA: 09/04/2020  PCP: Truett PernaWang, Yun, MD  Brief History/Interval Summary: 87 y.o. female, with PMH of essential hypertension, type 2 diabetes mellitus, peripheral neuropathy, spinal stenosis, chronic pain, hypothyroidism, vitamin D deficiency who presented to the ER on 09/04/2020 with worsening confusion.  Patient lives in an assisted living facility.  Was recently hospitalized for syncopal episode.  Found to have a urinary tract infection.  Was discharged on cefdinir.  Brought back by her son due to worsening mentation.   Consultants: None  Procedures: EEG has been ordered  Antibiotics: Anti-infectives (From admission, onward)   Start     Dose/Rate Route Frequency Ordered Stop   09/05/20 2100  cefTRIAXone (ROCEPHIN) 1 g in sodium chloride 0.9 % 100 mL IVPB        1 g 200 mL/hr over 30 Minutes Intravenous Every 24 hours 09/04/20 2334     09/04/20 2045  cefTRIAXone (ROCEPHIN) 1 g in sodium chloride 0.9 % 100 mL IVPB        1 g 200 mL/hr over 30 Minutes Intravenous  Once 09/04/20 2031 09/05/20 0027      Subjective/Interval History: Patient noted to be elevated more responsive this morning.  She does respond to questions. Some of the answers however did not make complete sense.  Continues to have mittens.  Denies any pain issues.      Assessment/Plan:  Acute metabolic encephalopathy/recent vasovagal syncope Likely multifactorial including incompletely treated UTI, dehydration and acute kidney injury.  Patient did not exhibit any focal deficits.  CT head did not show any focal findings.  Patient was hospitalized a few days ago for syncope thought to be due to vasovagal.   Further work-up was pursued.  MRI brain was done yesterday which shows atrophy and findings suggestive of remote infarcts.  No acute infarct identified. EEG is pending. PT OT and speech therapy evaluation is pending.  Folic acid level  normal.  W11B12 level normal.  TSH was normal when checked recently.  Continue IV hydration for now.  Continue to treat UTI.  Recently diagnosed UTI Urine cultures growing E. coli.  Noted to be pansensitive.  Continue ceftriaxone for now.  Follow-up on blood cultures.  Acute kidney injury on chronic kidney disease stage IIIb Likely dehydrated from poor oral intake.  Baseline creatinine around 1.  Lisinopril is on hold.  Continue with IV fluids.  Creatinine noted to be slightly higher today.  BUN is also higher.  Likely due to no oral intake.    Essential hypertension Unable to take oral meds due to her alteration in mental status.  Use hydralazine as needed.  Hypothyroidism TSH was 2.8 on 3/17.  Continue with levothyroxine.  Diabetes mellitus type 2, non-insulin-dependent HbA1c 6.6 when checked recently.  Check glucose levels once a day.  No SSI for Now.  Chronic low back pain/lumbar spinal stenosis Tramadol is being held currently .  PT and OT.  Gets around in a wheelchair.  History of peripheral neuropathy On gabapentin at home which is currently on hold due to her encephalopathy  Goals of care Long discussion with patient's son yesterday.  Prognosis is guarded at this time.  He was told that we are trying to determine if there are any reversible causes for her worsening mentation apart from the UTI.  Depending on results of further testing and her progress we may need to make further decisions regarding disposition and care.   DVT  Prophylaxis: Lovenox Code Status: DNR Family Communication: Discussed with patient's son yesterday. Disposition Plan: Likely needs higher level of care.  Cannot go back to ALF in her current state.  Status is: Inpatient  Remains inpatient appropriate because:Altered mental status, IV treatments appropriate due to intensity of illness or inability to take PO and Inpatient level of care appropriate due to severity of illness   Dispo:  Patient From:  Assisted Living Facility  Planned Disposition: To be determined  Medically stable for discharge: No         Medications:  Scheduled: . enoxaparin (LOVENOX) injection  30 mg Subcutaneous Q24H  . levothyroxine  75 mcg Oral QAC breakfast  . polyvinyl alcohol  2 drop Both Eyes TID   Continuous: . cefTRIAXone (ROCEPHIN)  IV 1 g (09/05/20 2217)   WUJ:[id number] **OR** acetaminophen, albuterol, haloperidol lactate, metoprolol tartrate, ondansetron **OR** ondansetron (ZOFRAN) IV, polyethylene glycol   Objective:  Vital Signs  Vitals:   09/05/20 1019 09/05/20 1422 09/05/20 2034 09/06/20 0523  BP: 139/65 140/65 (!) 152/79 (!) 151/68  Pulse: 71 81 97 94  Resp: 14  20 (!) 22  Temp: 97.6 F (36.4 C) 99.5 F (37.5 C) 99.6 F (37.6 C) 99.9 F (37.7 C)  TempSrc: Oral Oral Oral Oral  SpO2:   97% 97%  Weight:      Height:        Intake/Output Summary (Last 24 hours) at 09/06/2020 1003 Last data filed at 09/06/2020 0600 Gross per 24 hour  Intake 100 ml  Output 350 ml  Net -250 ml   Filed Weights   09/04/20 1806 09/05/20 0124  Weight: 58.5 kg 57.1 kg    General appearance: Awake alert.  Distracted. Dry oral mucous membranes Resp: Coarse breath sounds bilaterally.  No wheezing or rhonchi. Cardio: S1-S2 is normal regular.  No S3-S4.  No rubs murmurs or bruit GI: Abdomen is soft.  Nontender nondistended.  Bowel sounds are present normal.  No masses organomegaly Extremities: No edema.  Moving her lower extremities were noted to be quite weak Neurologic: Alert but more responsive today compared to yesterday.  No obvious focal deficits.  Remains confused    Lab Results:  Data Reviewed: I have personally reviewed following labs and imaging studies  CBC: Recent Labs  Lab 09/02/20 1200 09/02/20 2031 09/03/20 0836 09/04/20 1851 09/05/20 0555 09/06/20 0503  WBC 8.3 10.2 9.9 12.1* 11.1* 10.4  NEUTROABS 5.3  --   --  7.1  --   --   HGB 11.1* 12.0 12.6 13.9 12.4 12.9  HCT  35.6* 37.9 39.9 43.8 39.8 42.8  MCV 96.2 95.2 94.3 94.6 95.9 100.5*  PLT 150 208 198 273 231 171    Basic Metabolic Panel: Recent Labs  Lab 09/02/20 1200 09/02/20 1915 09/03/20 0836 09/04/20 1851 09/05/20 0555 09/06/20 0503  NA 138  --  142 140 142 142  K 5.7* 4.2 3.7 3.6 3.5 3.6  CL 106  --  106 103 108 109  CO2 24  --  25 24 21* 19*  GLUCOSE 182*  --  138* 130* 124* 134*  BUN 32*  --  24* 33* 33* 39*  CREATININE 1.38* 1.06* 1.07* 1.43* 1.24* 1.31*  CALCIUM 8.8*  --  9.3 9.7 9.1 9.3    GFR: Estimated Creatinine Clearance: 20.3 mL/min (A) (by C-G formula based on SCr of 1.31 mg/dL (H)).  Liver Function Tests: Recent Labs  Lab 09/02/20 1200 09/04/20 1851 09/05/20 0555 09/06/20 0503  AST 34 29  26 21  ALT 18 16 14 14   ALKPHOS 64 80 69 74  BILITOT 0.9 0.9 0.8 1.3*  PROT 7.0 8.4* 7.1 7.3  ALBUMIN 3.7 4.6 3.9 4.0    CBG: Recent Labs  Lab 09/04/20 1835  GLUCAP 137*     Recent Results (from the past 240 hour(s))  SARS CORONAVIRUS 2 (TAT 6-24 HRS) Nasopharyngeal Nasopharyngeal Swab     Status: None   Collection Time: 09/02/20 12:00 PM   Specimen: Nasopharyngeal Swab  Result Value Ref Range Status   SARS Coronavirus 2 NEGATIVE NEGATIVE Final    Comment: (NOTE) SARS-CoV-2 target nucleic acids are NOT DETECTED.  The SARS-CoV-2 RNA is generally detectable in upper and lower respiratory specimens during the acute phase of infection. Negative results do not preclude SARS-CoV-2 infection, do not rule out co-infections with other pathogens, and should not be used as the sole basis for treatment or other patient management decisions. Negative results must be combined with clinical observations, patient history, and epidemiological information. The expected result is Negative.  Fact Sheet for Patients: 09/04/20  Fact Sheet for Healthcare Providers: HairSlick.no  This test is not yet approved or  cleared by the quierodirigir.com FDA and  has been authorized for detection and/or diagnosis of SARS-CoV-2 by FDA under an Emergency Use Authorization (EUA). This EUA will remain  in effect (meaning this test can be used) for the duration of the COVID-19 declaration under Se ction 564(b)(1) of the Act, 21 U.S.C. section 360bbb-3(b)(1), unless the authorization is terminated or revoked sooner.  Performed at [facility], [address] [place], [place]   Culture, Urine     Status: Abnormal   Collection Time: 09/02/20  7:15 PM   Specimen: Urine, Random  Result Value Ref Range Status   Specimen Description   Final    URINE, RANDOM Performed at [facility], [address] [place], [place]    Special Requests   Final    NONE Performed at [facility], [address], [place], [place]    Culture >=100,000 COLONIES/mL ESCHERICHIA COLI (A)  Final   Report Status 09/05/2020 FINAL  Final   Organism ID, Bacteria ESCHERICHIA COLI (A)  Final      Susceptibility   Escherichia coli - MIC*    AMPICILLIN 8 SENSITIVE Sensitive     CEFAZOLIN <=4 SENSITIVE Sensitive     CEFEPIME <=0.12 SENSITIVE Sensitive     CEFTRIAXONE <=0.25 SENSITIVE Sensitive     CIPROFLOXACIN <=0.25 SENSITIVE Sensitive     GENTAMICIN <=1 SENSITIVE Sensitive     IMIPENEM <=0.25 SENSITIVE Sensitive     NITROFURANTOIN <=16 SENSITIVE Sensitive     TRIMETH/SULFA <=20 SENSITIVE Sensitive     AMPICILLIN/SULBACTAM 4 SENSITIVE Sensitive     PIP/TAZO <=4 SENSITIVE Sensitive     * >=100,000 COLONIES/mL ESCHERICHIA COLI  Urine Culture     Status: Abnormal (Preliminary result)   Collection Time: 09/04/20  6:51 PM   Specimen: Urine, Random  Result Value Ref Range Status   Specimen Description   Final    URINE, RANDOM Performed at [facility], [address], [place], [place]    Special Requests   Final    NONE Performed at  [facility], [address], [place], [place]    Culture (A)  Final    >=100,000 COLONIES/mL GRAM NEGATIVE RODS SUSCEPTIBILITIES TO FOLLOW Performed at [facility], [address] [place], [place] [address]    Report Status PENDING  Incomplete  Resp Panel by RT-PCR (Flu A&B, Covid) Nasopharyngeal Swab     Status: None   Collection Time: 09/04/20  9:25 PM   Specimen: Nasopharyngeal Swab; Nasopharyngeal(NP) swabs in vial transport medium  Result Value Ref Range Status   SARS Coronavirus 2 by RT PCR NEGATIVE NEGATIVE Final    Comment: (NOTE) SARS-CoV-2 target nucleic acids are NOT DETECTED.  The SARS-CoV-2 RNA is generally detectable in upper respiratory specimens during the acute phase of infection. The lowest concentration of SARS-CoV-2 viral copies this assay can detect is 138 copies/mL. A negative result does not preclude SARS-Cov-2 infection and should not be used as the sole basis for treatment or other patient management decisions. A negative result may occur with  improper specimen collection/handling, submission of specimen other than nasopharyngeal swab, presence of viral mutation(s) within the areas targeted by this assay, and inadequate number of viral copies(<138 copies/mL). A negative result must be combined with clinical observations, patient history, and epidemiological information. The expected result is Negative.  Fact Sheet for Patients:  BloggerCourse.com  Fact Sheet for Healthcare Providers:  SeriousBroker.it  This test is no t yet approved or cleared by the [place] FDA and  has been authorized for detection and/or diagnosis of SARS-CoV-2 by FDA under an Emergency Use Authorization (EUA). This EUA will remain  in effect (meaning this test can be used) for the duration of the COVID-19 declaration under Section 564(b)(1) of the Act, 21 U.S.C.section 360bbb-3(b)(1),  unless the authorization is terminated  or revoked sooner.       Influenza A by PCR NEGATIVE NEGATIVE Final   Influenza B by PCR NEGATIVE NEGATIVE Final    Comment: (NOTE) The Xpert Xpress SARS-CoV-2/FLU/RSV plus assay is intended as an aid in the diagnosis of influenza from Nasopharyngeal swab specimens and should not be used as a sole basis for treatment. Nasal washings and aspirates are unacceptable for Xpert Xpress SARS-CoV-2/FLU/RSV testing.  Fact Sheet for Patients: BloggerCourse.com  Fact Sheet for Healthcare Providers: SeriousBroker.it  This test is not yet approved or cleared by the [place] FDA and has been authorized for detection and/or diagnosis of SARS-CoV-2 by FDA under an Emergency Use Authorization (EUA). This EUA will remain in effect (meaning this test can be used) for the duration of the COVID-19 declaration under Section 564(b)(1) of the Act, 21 U.S.C. section 360bbb-3(b)(1), unless the authorization is terminated or revoked.  Performed at [facility], [address] [place], [place] [address]   Blood culture (routine x 2)     Status: None (Preliminary result)   Collection Time: 09/04/20  9:40 PM   Specimen: BLOOD  Result Value Ref Range Status   Specimen Description   Final    BLOOD SITE NOT SPECIFIED Performed at [facility], [address] [place], [place] [address]    Special Requests   Final    BOTTLES DRAWN AEROBIC AND ANAEROBIC Blood Culture adequate volume Performed at [facility], [address] [place], [place] [address]    Culture   Final    NO GROWTH 1 DAY Performed at [facility], [address] [place], [place] [address]    Report Status PENDING  Incomplete      Radiology Studies: CT HEAD WO CONTRAST  Result Date: 09/04/2020 CLINICAL DATA:  Altered mental status. EXAM: CT HEAD WITHOUT CONTRAST TECHNIQUE: Contiguous  axial images were obtained  from the base of the skull through the vertex without intravenous contrast. COMPARISON:  CT head dated 06/19/2020. FINDINGS: Brain: No evidence of acute infarction, hemorrhage, hydrocephalus, extra-axial collection or mass lesion/mass effect. There is mild cerebral volume loss with associated ex vacuo dilatation. Periventricular white matter hypoattenuation likely represents chronic small vessel ischemic disease. Vascular: There are vascular calcifications in the carotid siphons. Skull: Normal. Negative for fracture or focal lesion. Sinuses/Orbits: No acute finding. Other: None. IMPRESSION: No acute intracranial process. Electronically Signed   By: Romona Curls M.D.   On: 09/04/2020 18:51   MR BRAIN WO CONTRAST  Result Date: 09/05/2020 CLINICAL DATA:  Altered mental status of unknown etiology. EXAM: MRI HEAD WITHOUT CONTRAST TECHNIQUE: Multiplanar, multiecho pulse sequences of the brain and surrounding structures were obtained without intravenous contrast. COMPARISON:  Head CT yesterday. FINDINGS: Brain: Diffusion imaging does not show any true restricted diffusion to indicate acute or subacute infarction. There are 2 foci of T2 shine through seen within the frontoparietal white matter, 1 on each side. No cortical or large vessel territory insult. Chronic small-vessel ischemic changes affect pons. Few old small vessel cerebellar infarctions. There chronic small-vessel ischemic changes throughout the deep and subcortical white matter. No mass lesion, hemorrhage, hydrocephalus or extra-axial collection. Few punctate foci of hemosiderin deposition associated with some of the old small vessel infarctions. Vascular: Major vessels at the base of the brain show flow. Skull and upper cervical spine: Negative Sinuses/Orbits: Clear/normal Other: None IMPRESSION: No acute or reversible finding. Atrophy and small-vessel ischemic changes throughout the brain as outlined above. Two of the old small  vessel insults show T2 shine through, simulating more recent infarctions in the white matter, but there is no low signal on ADC map, indicating that these are in fact nonacute. Electronically Signed   By: Paulina Fusi M.D.   On: 09/05/2020 16:48   DG Chest Port 1 View  Result Date: 09/04/2020 CLINICAL DATA:  87 year old female with altered mental status. EXAM: PORTABLE CHEST 1 VIEW COMPARISON:  Chest radiograph dated 09/02/2020. FINDINGS: Probable left lung base atelectasis. No focal consolidation, pleural effusion or pneumothorax. Stable cardiomegaly. Atherosclerotic calcification of the aorta. Osteopenia with degenerative changes of the spine and shoulders. No acute osseous pathology. IMPRESSION: No active disease. Electronically Signed   By: Elgie Collard M.D.   On: 09/04/2020 21:23       LOS: 1 day   Osvaldo Shipper  [facility] Pager on [email or web address]  09/06/2020, 10:03 AM

## 2020-09-06 NOTE — Progress Notes (Addendum)
Initial Nutrition Assessment  DOCUMENTATION CODES:      INTERVENTION:  Magic cup TID with meals, each supplement provides 290 kcal and 9 grams of protein   Ensure Enlive po BID, each supplement provides 350 kcal and 20 grams of protein   NUTRITION DIAGNOSIS:  Inadequate oral intake related to acute illness (infection, poor appetite and limited fluid acceptance) as evidenced by per patient/family report (son reports poor intake x 3 days, per MD- UTI, dehydration, AKI on admission).   GOAL:  Patient will meet greater than or equal to 90% of their needs  MONITOR:  Diet advancement,PO intake,Supplement acceptance,Labs,Skin,Weight trends  REASON FOR ASSESSMENT:   Malnutrition Screening Tool    ASSESSMENT:  RD working remotely  Patient is a 87 yo female with history of DM-2, HTN, Chronic pain, Vitamin D deficiency and peripheral neuropathy. She presents from ALF with increased confusion, recent UTI, dehydration and AKI.  Palliative consult pending.   Speech therapy recommends thin liquids and assist with feeding. Percent intake- talked with patients son by telephone who reports patient ate 100% of ice cream.It is the only thing she has eaten since Saturday. Patient likes applesauce, chocolate Ensure, any flavor of pudding and jello. Poor acceptance of fluids in general needs encouragement to drink. Home diet regular.   Usual weight reported 126 lb (57.2 kg). Review of hospital weights range between 53-59 kg the past 20 months   Medications: Lovenox and synthroid.  IV-0.45% NS @ 75 ml/hr and Rocephin (1 gr q 24 hr).   Labs reviewed:BUN 39 (H), Cr 1.31 (H), Glu 134 (H). B-12: 450 wnl, Folic acid:16.4 wnl    Intake/Output Summary (Last 24 hours) at 09/06/2020 1400 Last data filed at 09/06/2020 0600 Gross per 24 hour  Intake 100 ml  Output 350 ml  Net -250 ml     NUTRITION - FOCUSED PHYSICAL EXAM:  Unable to complete Nutrition-Focused physical exam at this time.  RD working  remotely.  Diet Order:   Diet Order            Diet full liquid Room service appropriate? Yes; Fluid consistency: Thin  Diet effective now                 EDUCATION NEEDS:  Not appropriate at this time Not appropriate for education at this time  Skin:  Skin Assessment: Reviewed RN Assessment  Last BM:  3/18  Height:   Ht Readings from Last 1 Encounters:  09/05/20 5\' 3"  (1.6 m)    Weight:   Wt Readings from Last 1 Encounters:  09/05/20 57.1 kg    Ideal Body Weight:   52 kg  BMI:  Body mass index is 22.3 kg/m.  Estimated Nutritional Needs:   Kcal:  1400-1550  Protein:  68-75 gr  Fluid:  >1400 ml daily   09/07/20 MS,RD,CSG,LDN Pager: Royann Shivers

## 2020-09-06 NOTE — Procedures (Signed)
EEG Report Indication: possible seizure activity  This study was recorded in the waking/drowsy state.  The duration of the study was 23 minutes.  Electrodes were placed according to the International 10/20 system.  Video was reviewed for clinical correlation as needed.   In the waking state, some degree of background organization is seen with an attenuated but perceptible anterior - posterior voltage and frequency gradient.  In the occipital leads there is a symmetric and reactive posterior dominant rhythm of approximately 7 hertz, which is slower than expected for age.  Anteriorly, is the expected pattern of faster frequency, lower voltage waveforms though in a significantly reduced amount as compared to normal.   During the drowsy state, there is attenuation of the gradient, a general shift to slower frequencies diffusely to the theta range/diminished alpha, a waxing and waning of the posterior dominant rhythm, and slow roving eye movements.  No clear sleep transients were seen.   The most notable findings however were 1) slowing involving reduced faster frequencies (in the left temporal) and additionally intermixed low-moderate amplitude theta in the right temporal region, and 2) intermittent sharp waves in the right temporal region and an isolated likely epileptiform discharge (14:17:35).  Wickets are possible, but seem less likely given the associated slowing, lack of a classic up/downslope morphology or occurrence in a theta-frequency cluster.  Hyperventilation: deferred Photic stimulation: deferred   Impression:   This is an abnormal waking and drowsy study due to 1) attenuated organization as described above suggestive of a mild diffuse encephalopathy, with 2) superimposed focal neuronal dysfunction in the right greater than left bitemporal region, and 3) occasional, isolated right temporal discharges suggestive of an epileptiform foci in this region.  There was no evidence of seizure  activity however.

## 2020-09-07 DIAGNOSIS — I10 Essential (primary) hypertension: Secondary | ICD-10-CM | POA: Diagnosis not present

## 2020-09-07 DIAGNOSIS — Z7189 Other specified counseling: Secondary | ICD-10-CM

## 2020-09-07 DIAGNOSIS — Z66 Do not resuscitate: Secondary | ICD-10-CM

## 2020-09-07 DIAGNOSIS — N179 Acute kidney failure, unspecified: Secondary | ICD-10-CM | POA: Diagnosis not present

## 2020-09-07 DIAGNOSIS — E1122 Type 2 diabetes mellitus with diabetic chronic kidney disease: Secondary | ICD-10-CM | POA: Diagnosis not present

## 2020-09-07 DIAGNOSIS — N3000 Acute cystitis without hematuria: Secondary | ICD-10-CM | POA: Diagnosis not present

## 2020-09-07 DIAGNOSIS — Z515 Encounter for palliative care: Secondary | ICD-10-CM

## 2020-09-07 LAB — URINE CULTURE: Culture: >=100000 — AB

## 2020-09-07 LAB — COMPREHENSIVE METABOLIC PANEL
ALT: 12 U/L (ref 0–44)
AST: 21 U/L (ref 15–41)
Albumin: 3.6 g/dL (ref 3.5–5.0)
Alkaline Phosphatase: 62 U/L (ref 38–126)
Anion gap: 11 (ref 5–15)
BUN: 29 mg/dL — ABNORMAL HIGH (ref 8–23)
CO2: 20 mmol/L — ABNORMAL LOW (ref 22–32)
Calcium: 9.4 mg/dL (ref 8.9–10.3)
Chloride: 111 mmol/L (ref 98–111)
Creatinine, Ser: 1.09 mg/dL — ABNORMAL HIGH (ref 0.44–1.00)
GFR, Estimated: 46 mL/min — ABNORMAL LOW (ref >60)
Glucose, Bld: 153 mg/dL — ABNORMAL HIGH (ref 70–99)
Potassium: 3.3 mmol/L — ABNORMAL LOW (ref 3.5–5.1)
Sodium: 142 mmol/L (ref 135–145)
Total Bilirubin: 0.8 mg/dL (ref 0.3–1.2)
Total Protein: 6.9 g/dL (ref 6.5–8.1)

## 2020-09-07 LAB — CBC
HCT: 39.2 % (ref 36.0–46.0)
Hemoglobin: 12.4 g/dL (ref 12.0–15.0)
MCH: 30 pg (ref 26.0–34.0)
MCHC: 31.6 g/dL (ref 30.0–36.0)
MCV: 94.7 fL (ref 80.0–100.0)
Platelets: 157 10*3/uL (ref 150–400)
RBC: 4.14 MIL/uL (ref 3.87–5.11)
RDW: 14.6 % (ref 11.5–15.5)
WBC: 10.1 10*3/uL (ref 4.0–10.5)
nRBC: 0 % (ref 0.0–0.2)

## 2020-09-07 LAB — AMMONIA: Ammonia: 12 umol/L (ref 9–35)

## 2020-09-07 MED ORDER — VALPROATE SODIUM 100 MG/ML IV SOLN
1000.0000 mg | Freq: Once | INTRAVENOUS | Status: AC
  Administered 2020-09-07: 1000 mg via INTRAVENOUS
  Filled 2020-09-07: qty 10

## 2020-09-07 MED ORDER — POTASSIUM CHLORIDE IN NACL 20-0.45 MEQ/L-% IV SOLN
INTRAVENOUS | Status: AC
  Filled 2020-09-07 (×2): qty 1000

## 2020-09-07 MED ORDER — POTASSIUM CHLORIDE 10 MEQ/100ML IV SOLN
10.0000 meq | INTRAVENOUS | Status: AC
  Administered 2020-09-07 (×2): 10 meq via INTRAVENOUS
  Filled 2020-09-07: qty 100

## 2020-09-07 MED ORDER — DIVALPROEX SODIUM 125 MG PO CSDR
375.0000 mg | DELAYED_RELEASE_CAPSULE | Freq: Three times a day (TID) | ORAL | Status: DC
  Administered 2020-09-07 – 2020-09-08 (×3): 375 mg via ORAL
  Filled 2020-09-07 (×3): qty 3

## 2020-09-07 NOTE — Progress Notes (Signed)
Chaplain engaged in an initial visit with Lori Ellis and her son.  Son explained all that [name] has been facing.  Son has felt that his mom has needed an MRI for quite some time.  He is thankful that a doctor finally honored his request to have one done.  [name]' mother went through similar health issues towards the end of her life which prompted him to believe that his mother needed an MRI years ago.  Son is now looking forward to creating a plan and setting that in motion as he has learned more about his mom's health.  He has been serving as the liaison between doctors and their family.  Because he lives the closest, he has taken on the role of her caregiver and POA.  He is awaiting to speak to palliative care and get more answers about his mom's care.  He is very aware of what she desires because they spent a significant amount of time when his mom was mentally able engaging in future EOL planning.    Chaplain provided listening and presence.    09/07/20 1100  Clinical Encounter Type  Visited With Patient and family together  Visit Type Initial

## 2020-09-07 NOTE — Progress Notes (Signed)
[facility] PROGRESS NOTE   Lori Ellis:[id number] DOB: 11-18-1920 DOA: 09/04/2020  PCP: Truett Perna, MD  Brief History/Interval Summary: 87 y.o. female, with PMH of essential hypertension, type 2 diabetes mellitus, peripheral neuropathy, spinal stenosis, chronic pain, hypothyroidism, vitamin D deficiency who presented to the ER on 09/04/2020 with worsening confusion.  Patient lives in an assisted living facility.  Was recently hospitalized for syncopal episode.  Found to have a urinary tract infection.  Was discharged on cefdinir.  Brought back by her son due to worsening mentation.  UA was noted to be abnormal.  Patient was continued on ceftriaxone.  MRI brain does not show any acute infarcts but atrophy and chronic small vessel disease and chronic infarcts were noted.  EEG suggest epileptiform activity in the right temporal lobe.   Consultants: None  Procedures:  EEG 3/21 This is an abnormal waking and drowsy study due to 1) attenuated organization as described above suggestive of a mild diffuse encephalopathy, with 2) superimposed focal neuronal dysfunction in the right greater than left bitemporal region, and 3) occasional, isolated right temporal discharges suggestive of an epileptiform foci in this region.  There was no evidence of seizure activity however.   Antibiotics: Anti-infectives (From admission, onward)   Start     Dose/Rate Route Frequency Ordered Stop   09/05/20 2100  cefTRIAXone (ROCEPHIN) 1 g in sodium chloride 0.9 % 100 mL IVPB        1 g 200 mL/hr over 30 Minutes Intravenous Every 24 hours 09/04/20 2334 09/10/20 2059   09/04/20 2045  cefTRIAXone (ROCEPHIN) 1 g in sodium chloride 0.9 % 100 mL IVPB        1 g 200 mL/hr over 30 Minutes Intravenous  Once 09/04/20 2031 09/05/20 0027      Subjective/Interval History: Patient remains confused.  Some question she does answer appropriately but others not so much.  Son is at the bedside today.  She was not  able to recognize him.      Assessment/Plan:  Acute metabolic encephalopathy/recent vasovagal syncope Likely multifactorial including incompletely treated UTI, dehydration and acute kidney injury.  Patient did not exhibit any focal deficits.  CT head did not show any focal findings.  Patient was hospitalized a few days ago for syncope thought to be due to vasovagal.   Further work-up was pursued.  MRI brain was done yesterday which shows atrophy and findings suggestive of remote infarcts.  No acute infarct identified. Folic acid B12 level was normal.  TSH was normal when checked recently. Continue to treat UTI.  Depakote initiated today.  See below.  Concern for epilepsy Patient underwent EEG 3/21 which does raise concern for epileptiform activity in the right temporal area.  Discussed with Dr. Otelia Limes with neurology who recommends loading with Depakote and then starting 3 times a day dosage.  Ammonia level normal.  LFTs normal.  Check Depakote level in the morning.    Recently diagnosed UTI Urine cultures growing E. coli.  Noted to be pansensitive.  Continue ceftriaxone for now.  Follow-up on blood cultures.  Plan for 5-day course.  Acute kidney injury on chronic kidney disease stage IIIb/hypokalemia Likely dehydrated from poor oral intake.  Baseline creatinine around 1.  Lisinopril is on hold.  Patient's creatinine has improved with IV hydration.  Continue gentle IV fluids.  Patient's oral intake remains poor.   Replace potassium.  Check magnesium  Essential hypertension Unable to take oral meds due to her alteration in mental status.  Use hydralazine as needed.  Blood pressure is reasonably well controlled.  Occasional high readings noted.  Hypothyroidism TSH was 2.8 on 3/17.  Continue with levothyroxine.  Diabetes mellitus type 2, non-insulin-dependent HbA1c 6.6 when checked recently.  Check glucose levels once a day.  No SSI for Now.  Chronic low back pain/lumbar spinal  stenosis Tramadol is being held currently .  PT and OT.  Gets around in a wheelchair at baseline.  History of peripheral neuropathy On gabapentin at home which is currently on hold due to her encephalopathy  Goals of care Long discussion with patient's son .  Prognosis is guarded at this time.  We will see if patient improves with the initiation of Depakote.  She has not shown much improvement in the last 3 days otherwise.  After discussion with patient's son yesterday palliative Care has been consulted.   DVT Prophylaxis: Lovenox Code Status: DNR Family Communication: Son being updated daily Disposition Plan: Likely needs higher level of care.  Cannot go back to ALF in her current state.  Status is: Inpatient  Remains inpatient appropriate because:Altered mental status, IV treatments appropriate due to intensity of illness or inability to take PO and Inpatient level of care appropriate due to severity of illness   Dispo:  Patient From: Assisted Living Facility  Planned Disposition: Skilled Nursing Facility  Medically stable for discharge: No         Medications:  Scheduled: . divalproex  375 mg Oral Q8H  . enoxaparin (LOVENOX) injection  30 mg Subcutaneous Q24H  . feeding supplement  237 mL Oral BID BM  . levothyroxine  75 mcg Oral QAC breakfast  . polyvinyl alcohol  2 drop Both Eyes TID   Continuous: . sodium chloride 75 mL/hr at 09/07/20 0531  . cefTRIAXone (ROCEPHIN)  IV Stopped (09/06/20 2238)  . valproate sodium 1,000 mg (09/07/20 1055)   ZOX:[id number]:acetaminophen **OR** acetaminophen, albuterol, haloperidol lactate, metoprolol tartrate, ondansetron **OR** ondansetron (ZOFRAN) IV, polyethylene glycol   Objective:  Vital Signs  Vitals:   09/06/20 1303 09/06/20 2010 09/07/20 0443 09/07/20 0924  BP: (!) 148/84 (!) 160/77 (!) 160/86 (!) 150/70  Pulse: (!) 105 100 96 76  Resp: 20 20 20 20   Temp: 98.7 F (37.1 C) 98.2 F (36.8 C) 98.4 F (36.9 C) 98.6 F (37 C)   TempSrc: Axillary Oral Oral Axillary  SpO2: 97% 97% 97% 97%  Weight:      Height:        Intake/Output Summary (Last 24 hours) at 09/07/2020 1055 Last data filed at 09/07/2020 0531 Gross per 24 hour  Intake 1423.11 ml  Output -  Net 1423.11 ml   Filed Weights   09/04/20 1806 09/05/20 0124  Weight: 58.5 kg 57.1 kg    General appearance: Awake.  Distracted.  Does not answer questions appropriately Resp: Coarse breath sounds bilaterally.  No definite crackles or wheezing. Cardio: S1-S2 is normal regular.  No S3-S4.  No rubs murmurs or bruit GI: Abdomen is soft.  Nontender nondistended.  Bowel sounds are present normal.  No masses organomegaly Extremities: No edema.  Range of motion of both legs likely due to physical deconditioning and chronicity. Neurologic: Disoriented.  No obvious focal deficits.     Lab Results:  Data Reviewed: I have personally reviewed following labs and imaging studies  CBC: Recent Labs  Lab 09/02/20 1200 09/02/20 2031 09/03/20 0836 09/04/20 1851 09/05/20 0555 09/06/20 0503 09/07/20 0816  WBC 8.3   < > 9.9 12.1* 11.1* 10.4 10.1  NEUTROABS 5.3  --   --  7.1  --   --   --   HGB 11.1*   < > 12.6 13.9 12.4 12.9 12.4  HCT 35.6*   < > 39.9 43.8 39.8 42.8 39.2  MCV 96.2   < > 94.3 94.6 95.9 100.5* 94.7  PLT 150   < > 198 273 231 171 157   < > = values in this interval not displayed.    Basic Metabolic Panel: Recent Labs  Lab 09/03/20 0836 09/04/20 1851 09/05/20 0555 09/06/20 0503 09/07/20 0604  NA 142 140 142 142 142  K 3.7 3.6 3.5 3.6 3.3*  CL 106 103 108 109 111  CO2 25 24 21* 19* 20*  GLUCOSE 138* 130* 124* 134* 153*  BUN 24* 33* 33* 39* 29*  CREATININE 1.07* 1.43* 1.24* 1.31* 1.09*  CALCIUM 9.3 9.7 9.1 9.3 9.4    GFR: Estimated Creatinine Clearance: 24.4 mL/min (A) (by C-G formula based on SCr of 1.09 mg/dL (H)).  Liver Function Tests: Recent Labs  Lab 09/02/20 1200 09/04/20 1851 09/05/20 0555 09/06/20 0503 09/07/20 0604   AST 34 29 26 21 21   ALT 18 16 14 14 12   ALKPHOS 64 80 69 74 62  BILITOT 0.9 0.9 0.8 1.3* 0.8  PROT 7.0 8.4* 7.1 7.3 6.9  ALBUMIN 3.7 4.6 3.9 4.0 3.6    CBG: Recent Labs  Lab 09/04/20 1835  GLUCAP 137*     Recent Results (from the past 240 hour(s))  SARS CORONAVIRUS 2 (TAT 6-24 HRS) Nasopharyngeal Nasopharyngeal Swab     Status: None   Collection Time: 09/02/20 12:00 PM   Specimen: Nasopharyngeal Swab  Result Value Ref Range Status   SARS Coronavirus 2 NEGATIVE NEGATIVE Final    Comment: (NOTE) SARS-CoV-2 target nucleic acids are NOT DETECTED.  The SARS-CoV-2 RNA is generally detectable in upper and lower respiratory specimens during the acute phase of infection. Negative results do not preclude SARS-CoV-2 infection, do not rule out co-infections with other pathogens, and should not be used as the sole basis for treatment or other patient management decisions. Negative results must be combined with clinical observations, patient history, and epidemiological information. The expected result is Negative.  Fact Sheet for Patients: 09/06/20  Fact Sheet for Healthcare Providers: 09/04/20  This test is not yet approved or cleared by the [place] FDA and  has been authorized for detection and/or diagnosis of SARS-CoV-2 by FDA under an Emergency Use Authorization (EUA). This EUA will remain  in effect (meaning this test can be used) for the duration of the COVID-19 declaration under Se ction 564(b)(1) of the Act, 21 U.S.C. section 360bbb-3(b)(1), unless the authorization is terminated or revoked sooner.  Performed at [facility], [address], [place], [address] [place]   Culture, Urine     Status: Abnormal   Collection Time: 09/02/20  7:15 PM   Specimen: Urine, Random  Result Value Ref Range Status   Specimen Description   Final    URINE, RANDOM Performed at [facility], [address], [place], [place]    Special Requests   Final    NONE Performed at [facility], [address] [place], [place]    Culture >=100,000 COLONIES/mL ESCHERICHIA COLI (A)  Final   Report Status 09/05/2020 FINAL  Final   Organism ID, Bacteria ESCHERICHIA COLI (A)  Final      Susceptibility   Escherichia coli - MIC*  AMPICILLIN 8 SENSITIVE Sensitive     CEFAZOLIN <=4 SENSITIVE Sensitive     CEFEPIME <=0.12 SENSITIVE Sensitive     CEFTRIAXONE <=0.25 SENSITIVE Sensitive     CIPROFLOXACIN <=0.25 SENSITIVE Sensitive     GENTAMICIN <=1 SENSITIVE Sensitive     IMIPENEM <=0.25 SENSITIVE Sensitive     NITROFURANTOIN <=16 SENSITIVE Sensitive     TRIMETH/SULFA <=20 SENSITIVE Sensitive     AMPICILLIN/SULBACTAM 4 SENSITIVE Sensitive     PIP/TAZO <=4 SENSITIVE Sensitive     * >=100,000 COLONIES/mL ESCHERICHIA COLI  Urine Culture     Status: Abnormal   Collection Time: 09/04/20  6:51 PM   Specimen: Urine, Random  Result Value Ref Range Status   Specimen Description   Final    URINE, RANDOM Performed at [facility], [address] [place], [place] [address]    Special Requests   Final    NONE Performed at [facility], [address] [place], [place] [address]    Culture >=100,000 COLONIES/mL ESCHERICHIA COLI (A)  Final   Report Status 09/07/2020 FINAL  Final   Organism ID, Bacteria ESCHERICHIA COLI (A)  Final      Susceptibility   Escherichia coli - MIC*    AMPICILLIN 8 SENSITIVE Sensitive     CEFAZOLIN <=4 SENSITIVE Sensitive     CEFEPIME <=0.12 SENSITIVE Sensitive     CEFTRIAXONE <=0.25 SENSITIVE Sensitive     CIPROFLOXACIN <=0.25 SENSITIVE Sensitive     GENTAMICIN <=1 SENSITIVE Sensitive     IMIPENEM <=0.25 SENSITIVE Sensitive     NITROFURANTOIN <=16 SENSITIVE Sensitive     TRIMETH/SULFA <=20 SENSITIVE Sensitive     AMPICILLIN/SULBACTAM 4 SENSITIVE Sensitive     PIP/TAZO <=4  SENSITIVE Sensitive     * >=100,000 COLONIES/mL ESCHERICHIA COLI  Resp Panel by RT-PCR (Flu A&B, Covid) Nasopharyngeal Swab     Status: None   Collection Time: 09/04/20  9:25 PM   Specimen: Nasopharyngeal Swab; Nasopharyngeal(NP) swabs in vial transport medium  Result Value Ref Range Status   SARS Coronavirus 2 by RT PCR NEGATIVE NEGATIVE Final    Comment: (NOTE) SARS-CoV-2 target nucleic acids are NOT DETECTED.  The SARS-CoV-2 RNA is generally detectable in upper respiratory specimens during the acute phase of infection. The lowest concentration of SARS-CoV-2 viral copies this assay can detect is 138 copies/mL. A negative result does not preclude SARS-Cov-2 infection and should not be used as the sole basis for treatment or other patient management decisions. A negative result may occur with  improper specimen collection/handling, submission of specimen other than nasopharyngeal swab, presence of viral mutation(s) within the areas targeted by this assay, and inadequate number of viral copies(<138 copies/mL). A negative result must be combined with clinical observations, patient history, and epidemiological information. The expected result is Negative.  Fact Sheet for Patients:  BloggerCourse.com  Fact Sheet for Healthcare Providers:  SeriousBroker.it  This test is no t yet approved or cleared by the [place] FDA and  has been authorized for detection and/or diagnosis of SARS-CoV-2 by FDA under an Emergency Use Authorization (EUA). This EUA will remain  in effect (meaning this test can be used) for the duration of the COVID-19 declaration under Section 564(b)(1) of the Act, 21 U.S.C.section 360bbb-3(b)(1), unless the authorization is terminated  or revoked sooner.       Influenza A by PCR NEGATIVE NEGATIVE Final   Influenza B by PCR NEGATIVE NEGATIVE Final    Comment: (NOTE) The Xpert Xpress SARS-CoV-2/FLU/RSV  plus assay  is intended as an aid in the diagnosis of influenza from Nasopharyngeal swab specimens and should not be used as a sole basis for treatment. Nasal washings and aspirates are unacceptable for Xpert Xpress SARS-CoV-2/FLU/RSV testing.  Fact Sheet for Patients: BloggerCourse.com  Fact Sheet for Healthcare Providers: SeriousBroker.it  This test is not yet approved or cleared by the [place] FDA and has been authorized for detection and/or diagnosis of SARS-CoV-2 by FDA under an Emergency Use Authorization (EUA). This EUA will remain in effect (meaning this test can be used) for the duration of the COVID-19 declaration under Section 564(b)(1) of the Act, 21 U.S.C. section 360bbb-3(b)(1), unless the authorization is terminated or revoked.  Performed at [facility], [address] [place], [place] [address]   Blood culture (routine x 2)     Status: None (Preliminary result)   Collection Time: 09/04/20  9:40 PM   Specimen: BLOOD  Result Value Ref Range Status   Specimen Description   Final    BLOOD SITE NOT SPECIFIED Performed at [facility], [address], [place], [place] [address]    Special Requests   Final    BOTTLES DRAWN AEROBIC AND ANAEROBIC Blood Culture adequate volume Performed at [facility], [address] [place], [place] [address]    Culture   Final    NO GROWTH 2 DAYS Performed at [facility], [address], [place], [place] [address]    Report Status PENDING  Incomplete      Radiology Studies: MR BRAIN WO CONTRAST  Result Date: 09/05/2020 CLINICAL DATA:  Altered mental status of unknown etiology. EXAM: MRI HEAD WITHOUT CONTRAST TECHNIQUE: Multiplanar, multiecho pulse sequences of the brain and surrounding structures were obtained without intravenous contrast. COMPARISON:  Head CT yesterday. FINDINGS: Brain: Diffusion imaging does not show any true  restricted diffusion to indicate acute or subacute infarction. There are 2 foci of T2 shine through seen within the frontoparietal white matter, 1 on each side. No cortical or large vessel territory insult. Chronic small-vessel ischemic changes affect pons. Few old small vessel cerebellar infarctions. There chronic small-vessel ischemic changes throughout the deep and subcortical white matter. No mass lesion, hemorrhage, hydrocephalus or extra-axial collection. Few punctate foci of hemosiderin deposition associated with some of the old small vessel infarctions. Vascular: Major vessels at the base of the brain show flow. Skull and upper cervical spine: Negative Sinuses/Orbits: Clear/normal Other: None IMPRESSION: No acute or reversible finding. Atrophy and small-vessel ischemic changes throughout the brain as outlined above. Two of the old small vessel insults show T2 shine through, simulating more recent infarctions in the white matter, but there is no low signal on ADC map, indicating that these are in fact nonacute. Electronically Signed   By: Paulina Fusi M.D.   On: 09/05/2020 16:48   EEG adult  Result Date: 09/06/2020 Lilian Coma, MD     09/06/2020  9:45 PM EEG Report Indication: possible seizure activity This study was recorded in the waking/drowsy state.  The duration of the study was 23 minutes.  Electrodes were placed according to the International 10/20 system.  Video was reviewed for clinical correlation as needed.  In the waking state, some degree of background organization is seen with an attenuated but perceptible anterior - posterior voltage and frequency gradient.  In the occipital leads there is a symmetric and reactive posterior dominant rhythm of approximately 7 hertz, which is slower than expected for age.  Anteriorly, is the expected pattern of faster frequency, lower voltage waveforms though in a significantly reduced amount as compared to normal.  During the drowsy state, there is  attenuation of the gradient, a general shift to slower frequencies diffusely to the theta range/diminished alpha, a waxing and waning of the posterior dominant rhythm, and slow roving eye movements.  No clear sleep transients were seen.  The most notable findings however were 1) slowing involving reduced faster frequencies (in the left temporal) and additionally intermixed low-moderate amplitude theta in the right temporal region, and 2) intermittent sharp waves in the right temporal region and an isolated likely epileptiform discharge (14:17:35).  Wickets are possible, but seem less likely given the associated slowing, lack of a classic up/downslope morphology or occurrence in a theta-frequency cluster. Hyperventilation: deferred Photic stimulation: deferred  Impression:  This is an abnormal waking and drowsy study due to 1) attenuated organization as described above suggestive of a mild diffuse encephalopathy, with 2) superimposed focal neuronal dysfunction in the right greater than left bitemporal region, and 3) occasional, isolated right temporal discharges suggestive of an epileptiform foci in this region.  There was no evidence of seizure activity however.       LOS: 2 days   Gokul Rito Ehrlich  [facility] Pager on [email or web address]  09/07/2020, 10:55 AM

## 2020-09-07 NOTE — Progress Notes (Signed)
SLP Cancellation Note  Patient Details Name: Lori Ellis MRN: [id number] DOB: 1921/05/04   Cancelled treatment:       Reason Eval/Treat Not Completed: Other (comment);Fatigue/lethargy limiting ability to participate (pt lethargic, RN reports pt only taking few sips but sleeping much of the day, await for potential improvement given her findings on EEG per RN, no family present, will continue efforts)  Rolena Infante, MS [facility] SLP Acute Rehab Services Office [phone] Pager [phone]    Chales Abrahams 09/07/2020, 7:49 PM

## 2020-09-07 NOTE — Progress Notes (Addendum)
Consultation Note Date: 09/07/2020   Patient Name: Lori Ellis  DOB: 1921/06/06  MRN: [id number]  Age / Sex: 87 y.o., female   PCP: Rudene Anda, MD Referring Physician: Bonnielee Haff, MD   REASON FOR CONSULTATION:Establishing goals of care  Palliative Care consult requested for goals of care discussion in this 87 y.o. female with past medical history of type 2 diabetes, peripheral neuropathy, chronic pain, hypothyroidism, hypertension, and chronic kidney disease stage III.  Patient presented to the ED from [facility] assisted living facility with concerns for altered mental status.  Patient was recently hospitalized for syncopal episodes and also treated for UTI (discharged on antibiotics).  CT of head showed no acute abnormalities.  Chest x-ray unremarkable.  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  Clinical Assessment and Goals of Care: I have reviewed medical records including lab results, imaging, Epic notes, and MAR, received report from the bedside RN, and assessed the patient.   Lori Ellis is somewhat somnolent.  Easily awakened with verbal stimuli however remains confused.  Her 2 sons are at the bedside and she is unable to recognize them.  I met at the bedside with patient's sons Lori Ellis [facility]) and Lori Ellis to discuss diagnosis prognosis, GOC, EOL wishes, disposition and options.  I introduced Palliative Medicine as specialized medical care for people living with serious illness. It focuses on providing relief from the symptoms and stress of a serious illness. The goal is to improve quality of life for both the patient and the family.  Family verbalized understanding and appreciation.  We discussed a brief life review of the patient, along with her functional and nutritional status.  Sons report patient is a widow with 3 sons.  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  She is of [place] faith.  Prior to hospitalization  patient resided at [facility] assisted living since September 2021.  Prior to assisted living facility she lived in the home with 24/7 caregivers.  Patient has been wheelchair-bound for the past 5-6 months.  Requires total assistance with all ADLs.  Appetite is generally good per family.  Lori Ellis shows me a video of patient sitting up in the wheelchair and dancing and clapping at the facility.  We discussed Her current illness and what it means in the larger context of Her on-going co-morbidities. Natural disease trajectory and expectations at EOL were discussed.  A detailed discussion was had today regarding advanced directives.  Concepts specific to code status, artifical feeding and hydration, continued IV antibiotics and rehospitalization. The difference between a aggressive medical intervention and a palliative comfort care path were discussed at length.   Values and goals of care important to patient and family were attempted to be elicited.   Family verbalizes understanding of patient's current condition and comorbidities.  They acknowledge patient's decline in quality of life over the past several months.  Patient has a documented advanced directive which is on file and has been reviewed.  Lori Ellis is patient's surrogate decision-maker however he communicates closely with his brothers alone for joint decision-making.  Family confirms DNR/DNI.  Patient would not want any forms of life-sustaining measures including artificial feedings/PEG or dialysis.  Family is remaining hopeful for some improvement/stability however is also preparing for the worse (no improvement or further decline).  They are excepting of patient's condition and feels if she shows no improvement they would be open to focusing on her comfort and end-of-life.  They would like  to continue to treat the treatable and watchfully wait allowing patient the opportunity to thrive.  Family is requesting formal follow-up on Friday to evaluate  patient's condition however open to earlier discussion if any significant changes.  They expressed ultimately their hopes are patient would somewhat improve and discharged to a skilled facility. We discussed options such as outpatient hospice at a facility, hospice home, or patient returning home with 24/7 caregivers with outpatient hospice support.  Family verbalized understanding and ongoing discussions have been encouraged.  Hospice and Palliative Care services outpatient were explained and offered. Family verbalized their understanding and awareness of both palliative and hospice's goals and philosophy of care. They will make further decisions when appropriate.    Questions and concerns were addressed.  The family was encouraged to call with questions or concerns.  PMT will continue to support holistically as needed.   CODE STATUS: DNR  ADVANCE DIRECTIVES: Primary Decision Maker: Lori Ellis (HCPOA)    SYMPTOM MANAGEMENT: per attending   Palliative Prophylaxis:   Aspiration, Bowel Regimen, Delirium Protocol, Eye Care, Frequent Pain Assessment, Oral Care and Turn Reposition  PSYCHO-SOCIAL/SPIRITUAL:  Support System: Family  Desire for further Chaplaincy support: Yes  Additional Recommendations (Limitations, Scope, Preferences):  No Artificial Feeding, No Hemodialysis and treat the treatable with watchful waiting  Education on hospice/palliative    PAST MEDICAL HISTORY: Past Medical History:  Diagnosis Date  . Degenerative disc disease, lumbar   . Diabetes mellitus without complication ([place])   . Hypertension   . Renal disorder   . Thyroid disease     ALLERGIES:  is allergic to fentanyl, penicillins, tetanus toxoid, adhesive [tape], advil [ibuprofen], hydrochlorothiazide, and naproxen sodium.   MEDICATIONS:  Current Facility-Administered Medications  Medication Dose Route Frequency Provider Last Rate Last Admin  . 0.45 % NaCl with KCl 20 mEq / L infusion   Intravenous  Continuous Bonnielee Haff, MD      . acetaminophen (TYLENOL) tablet 650 mg  650 mg Oral Q6H PRN Doran Heater, DO       Or  . acetaminophen (TYLENOL) suppository 650 mg  650 mg Rectal Q6H PRN MacNeil, Richard G, DO      . albuterol (PROVENTIL) (2.5 MG/3ML) 0.083% nebulizer solution 2.5 mg  2.5 mg Nebulization Q6H PRN MacNeil, Richard G, DO      . cefTRIAXone (ROCEPHIN) 1 g in sodium chloride 0.9 % 100 mL IVPB  1 g Intravenous Q24H Bonnielee Haff, MD   Stopped at 09/06/20 2238  . divalproex (DEPAKOTE SPRINKLE) capsule 375 mg  375 mg Oral Q8H Bonnielee Haff, MD      . enoxaparin (LOVENOX) injection 30 mg  30 mg Subcutaneous Q24H MacNeil, Richard G, DO   30 mg at 09/06/20 1048  . feeding supplement (ENSURE ENLIVE / ENSURE PLUS) liquid 237 mL  237 mL Oral BID BM Bonnielee Haff, MD   237 mL at 09/06/20 1734  . haloperidol lactate (HALDOL) injection 2 mg  2 mg Intravenous Q6H PRN Bonnielee Haff, MD   2 mg at 09/06/20 1337  . levothyroxine (SYNTHROID) tablet 75 mcg  75 mcg Oral QAC breakfast Doran Heater, DO   75 mcg at 09/07/20 0548  . metoprolol tartrate (LOPRESSOR) injection 2.5 mg  2.5 mg Intravenous Q6H PRN Bonnielee Haff, MD      . ondansetron [facility]) tablet 4 mg  4 mg Oral Q6H PRN MacNeil, Richard G, DO       Or  . ondansetron (ZOFRAN) injection 4 mg  4  mg Intravenous Q6H PRN Doran Heater, DO      . polyethylene glycol (MIRALAX / GLYCOLAX) packet 17 g  17 g Oral Daily PRN MacNeil, Richard G, DO      . polyvinyl alcohol (LIQUIFILM TEARS) 1.4 % ophthalmic solution 2 drop  2 drop Both Eyes TID Eudelia Bunch, RPH   2 drop at 09/06/20 2209  . potassium chloride 10 mEq in 100 mL IVPB  10 mEq Intravenous Q1 Hr x 2 Bonnielee Haff, MD        VITAL SIGNS: BP (!) 150/70 (BP Location: Left Arm) Comment: Reported to - Archer Asa, RN  Pulse 76   Temp 98.6 F (37 C) (Axillary)   Resp 20   Ht '5\' 3"'  (1.6 m)   Wt 57.1 kg   SpO2 97%   BMI 22.30 kg/m  Filed Weights   09/04/20 1806  09/05/20 0124  Weight: 58.5 kg 57.1 kg    Estimated body mass index is 22.3 kg/m as calculated from the following:   Height as of this encounter: '5\' 3"'  (1.6 m).   Weight as of this encounter: 57.1 kg.  LABS: CBC:    Component Value Date/Time   WBC 10.1 09/07/2020 0816   HGB 12.4 09/07/2020 0816   HCT 39.2 09/07/2020 0816   PLT 157 09/07/2020 0816   Comprehensive Metabolic Panel:    Component Value Date/Time   NA 142 09/07/2020 0604   K 3.3 (L) 09/07/2020 0604   BUN 29 (H) 09/07/2020 0604   CREATININE 1.09 (H) 09/07/2020 0604   ALBUMIN 3.6 09/07/2020 0604     Review of Systems  Unable to perform ROS: Acuity of condition   Unless otherwise noted, a complete review of systems is negative.  Physical Exam General: NAD, frail chronically-ill appearing Cardiovascular: regular rate and rhythm Pulmonary: clear ant fields, diminished bilaterally  Abdomen: soft, nontender, + bowel sounds Extremities: no edema, no joint deformities Skin: no rashes, warm and dry Neurological: somnolent, will easily aroused, confused   Prognosis: POOR  Discharge Planning:  To Be Determined  Recommendations: . DNR/DNI-as confirmed by family . Continue with current plan of care . Family is requesting to continue to treat the treatable while watchfully waiting.  They remain hopeful for some signs of improvement/stability however are realistic and poor prognosis and would be open to focusing on patient's comfort in the upcoming days. . Education provided on outpatient hospice and palliative and what care will look like. Marland Kitchen PMT will continue to support and follow as needed. Please call team line with urgent needs.   Palliative Performance Scale: PPS 20%              Family expressed understanding and was in agreement with this plan.   Thank you for allowing the Palliative Medicine Team to assist in the care of this patient. Please utilize secure chat with additional questions, if there is no  response within 30 minutes please call the above phone number.   Time In: 1135 Time Out: 1240 Time Total:65 min  Visit consisted of counseling and education dealing with the complex and emotionally intense issues of symptom management and palliative care in the setting of serious and potentially life-threatening illness.Greater than 50%  of this time was spent counseling and coordinating care related to the above assessment and plan.  Signed by:  Alda Lea, AGPCNP-BC Palliative Medicine Team  Phone: [phone] Pager: [phone] Amion: [place] Team providers are available by phone from 7am  to 7pm daily and can be reached through the team cell phone.  Should this patient require assistance outside of these hours, please call the patient's attending physician.   Reviewed and discussed with Alda Lea.  Agree with assessment and plan as outlined above.  Please see above for details of encounter.  Micheline Rough, MD [place] Team [phone]

## 2020-09-07 NOTE — Progress Notes (Signed)
PT Cancellation Note  Patient Details Name: Lori Ellis MRN: [id number] DOB: [birth year]   Cancelled Treatment:    Reason Eval/Treat Not Completed: Other (comment) Palliative care pending.   Joncarlos Atkison,[name] 09/07/2020, 11:47 AM Thomasene Mohair PT, DPT Acute Rehabilitation Services Pager: [phone] Office: [phone]

## 2020-09-08 DIAGNOSIS — E1122 Type 2 diabetes mellitus with diabetic chronic kidney disease: Secondary | ICD-10-CM | POA: Diagnosis not present

## 2020-09-08 DIAGNOSIS — R5383 Other fatigue: Secondary | ICD-10-CM | POA: Diagnosis not present

## 2020-09-08 DIAGNOSIS — G9341 Metabolic encephalopathy: Secondary | ICD-10-CM

## 2020-09-08 DIAGNOSIS — Z515 Encounter for palliative care: Secondary | ICD-10-CM | POA: Diagnosis not present

## 2020-09-08 DIAGNOSIS — N390 Urinary tract infection, site not specified: Secondary | ICD-10-CM | POA: Diagnosis not present

## 2020-09-08 DIAGNOSIS — Z7189 Other specified counseling: Secondary | ICD-10-CM | POA: Diagnosis not present

## 2020-09-08 DIAGNOSIS — Z66 Do not resuscitate: Secondary | ICD-10-CM | POA: Diagnosis not present

## 2020-09-08 LAB — COMPREHENSIVE METABOLIC PANEL
ALT: 12 U/L (ref 0–44)
AST: 17 U/L (ref 15–41)
Albumin: 3.4 g/dL — ABNORMAL LOW (ref 3.5–5.0)
Alkaline Phosphatase: 65 U/L (ref 38–126)
Anion gap: 11 (ref 5–15)
BUN: 27 mg/dL — ABNORMAL HIGH (ref 8–23)
CO2: 20 mmol/L — ABNORMAL LOW (ref 22–32)
Calcium: 9.1 mg/dL (ref 8.9–10.3)
Chloride: 110 mmol/L (ref 98–111)
Creatinine, Ser: 0.87 mg/dL (ref 0.44–1.00)
GFR, Estimated: >60 mL/min (ref >60)
Glucose, Bld: 126 mg/dL — ABNORMAL HIGH (ref 70–99)
Potassium: 3.9 mmol/L (ref 3.5–5.1)
Sodium: 141 mmol/L (ref 135–145)
Total Bilirubin: 0.8 mg/dL (ref 0.3–1.2)
Total Protein: 6.8 g/dL (ref 6.5–8.1)

## 2020-09-08 LAB — CBC
HCT: 38.8 % (ref 36.0–46.0)
Hemoglobin: 12.1 g/dL (ref 12.0–15.0)
MCH: 30 pg (ref 26.0–34.0)
MCHC: 31.2 g/dL (ref 30.0–36.0)
MCV: 96.3 fL (ref 80.0–100.0)
Platelets: 149 10*3/uL — ABNORMAL LOW (ref 150–400)
RBC: 4.03 MIL/uL (ref 3.87–5.11)
RDW: 14.7 % (ref 11.5–15.5)
WBC: 11.2 10*3/uL — ABNORMAL HIGH (ref 4.0–10.5)
nRBC: 0 % (ref 0.0–0.2)

## 2020-09-08 LAB — VALPROIC ACID LEVEL: Valproic Acid Lvl: 63 ug/mL (ref 50.0–100.0)

## 2020-09-08 LAB — MAGNESIUM: Magnesium: 2.1 mg/dL (ref 1.7–2.4)

## 2020-09-08 MED ORDER — VALPROATE SODIUM 100 MG/ML IV SOLN
375.0000 mg | Freq: Three times a day (TID) | INTRAVENOUS | Status: DC
  Administered 2020-09-08 (×2): 375 mg via INTRAVENOUS
  Filled 2020-09-08 (×3): qty 3.75

## 2020-09-08 MED ORDER — SODIUM CHLORIDE 0.9 % IV SOLN: 250.0000 mg | Freq: Two times a day (BID) | INTRAVENOUS | Status: DC

## 2020-09-08 MED ORDER — SODIUM CHLORIDE 0.9 % IV SOLN
250.0000 mg | Freq: Two times a day (BID) | INTRAVENOUS | Status: DC
  Administered 2020-09-09 – 2020-09-13 (×9): 250 mg via INTRAVENOUS
  Filled 2020-09-08 (×14): qty 2.5

## 2020-09-08 MED ORDER — KCL IN DEXTROSE-NACL 20-5-0.9 MEQ/L-%-% IV SOLN
INTRAVENOUS | Status: DC
  Filled 2020-09-08 (×4): qty 1000

## 2020-09-08 NOTE — TOC Progression Note (Signed)
Transition of Care [facility]) - Progression Note    Patient Details  Name: Lori Ellis MRN: [id number] Date of Birth: 1921-05-21  Transition of Care [facility]) CM/SW Contact  Leora Platt, Olegario Messier, RN Phone Number: 09/08/2020, 2:46 PM  Clinical Narrative: [facility] care following w/discussions with son awaiting nephrology input. [facility]       Barriers to Discharge: Continued Medical Work up  Expected Discharge Plan and Services Expected Discharge Plan: Assisted Living       Living arrangements for the past 2 months: Assisted Living Facility                                       Social Determinants of Health (SDOH) Interventions    Readmission Risk Interventions No flowsheet data found.

## 2020-09-08 NOTE — Progress Notes (Signed)
Daily Progress Note   Patient Name: Lori Ellis       Date: 09/08/2020 DOB: 1921-04-04  Age: 87 y.o. MRN#: [id number] Attending Physician: Merlene Laughter, DO Primary Care Physician: Truett Perna, MD Admit Date: 09/04/2020  Reason for Consultation/Follow-up: Establishing goals of care  Subjective: Lethargic. Does not respond to stimuli. Unable to safely take medications or nutrition by mouth. Appears comfortable in no distress. No family at the bedside.   I spoke with son/POA, [name]. Provided updates in addition to Dr. Marland Mcalpine.  Son is tearful in discussion, expressing he and family was remaining hopeful patient have shown some improvement with understanding it may not have been much.  He is realistic in his understanding that patient is further declining and is at risk of sudden death.  Lengthy discussion and education provided regarding comfort care measures and outpatient hospice versus anticipated hospital death.  Son verbalizes understanding and appreciation of continued support.  Family is requesting to continue with current plan of care with pending neurology input.  If no further treatment options available they would like to focus on patient's comfort and symptom management allow her to be at peace during what time she has left.  Son understands patient most likely would not be able to return to [place].  All questions answered and support provided.  Family is requesting palliative follow-up tomorrow morning for final decisions allowing time to process any neurology recommendations.  Length of Stay: 3 days  Vital Signs: BP (!) 163/72 (BP Location: Right Arm)   Pulse 88   Temp 98.8 F (37.1 C) (Axillary)   Resp 20   Ht 5\' 3"  (1.6 m)   Wt 57.1 kg   SpO2 97%   BMI 22.30 kg/m  SpO2: SpO2: 97 % O2 Device: O2 Device: Room Air O2 Flow Rate:    Physical Exam: Lethargic, no verbal response, ill-appearing Diminished bilaterally RRR  Palliative Care Assessment & Plan   HPI: Palliative Care consult requested for goals of care discussion in this 87 y.o. female with past medical history of type 2 diabetes, peripheral neuropathy, chronic pain, hypothyroidism, hypertension, and chronic kidney disease stage III.  Patient presented to the ED from [facility] assisted living facility with concerns for altered mental status.  Patient was recently hospitalized for syncopal episodes and also treated for UTI (discharged on antibiotics).  CT of head showed no acute abnormalities.  Chest x-ray unremarkable.  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  Code Status:  DNR  Goals of Care/Recommendations:  Continue current plan of care.  Family is realistic and understanding patient is showing no improvement with further decline.  Would like neurology input prior to making final decisions.  Family is leaning towards comfort/end-of-life care.  Education provided on comfort measures and outpatient hospice support.  [facility] verbalized understanding expressing if patient is stable enough to transfer they would prefer hospice of the [name] once final decisions have been made.  No escalation of care  PMT will continue to support and follow  Prognosis: < 2 weeks  Discharge Planning: To Be Determined  Thank you for allowing the Palliative Medicine Team to assist in the care of this patient.  Time Total:35  Min.   Visit consisted of counseling and education dealing with the complex and emotionally intense issues of symptom management and palliative care in the setting of serious and potentially life-threatening illness.Greater than 50%  of this time was spent counseling and coordinating care related to the above assessment and plan.  Alda Lea, AGPCNP-BC  Palliative Medicine Team [phone]

## 2020-09-08 NOTE — Consult Note (Addendum)
Neurology Consultation  Reason for Consult: AMS. Abnormal EEG Referring Physician: Dr Marguerita Merles, [facility]  CC: AMS, Abnormal EEG with concern for epileptogenic foci on right hemisphere. Long term prognosis  History is obtained from: Chart - no family at bedside. Consult done at 10pm.  HPI: Lori Ellis is a 87 y.o. female PMH of DM, HTN, unknown cognitive baseline and unfortunately no family at bedside due to the timing of this consultation, who has been admitted according to the H&P for evaluation of altered mental status and encephalopathy from a facility where she lives at. She was seen initially on 09/02/2020 for evaluation of syncopal episode, deemed to be possible vasovagal syncope in the setting of a UTI with treatment for UTI initiated when the cultures grew pansensitive E. coli.  Continued to be encephalopathic and brought back again on 09/04/2020 for continuing encephalopathy. In spite of treatment for UTI, not much significant improvement in mentation hence EEG was obtained on 09/06/2020-which showed mild diffuse encephalopathy with superimposed focal neuronal dysfunction in the right greater than left bitemporal regions and occasional isolated right temporal discharges suggestive of an epileptiform focus however no seizure activity was noted.  MRI of the brain with no acute or reversible finding, atrophy and small vessel ischemic changes throughout the brain.  Nonacute strokes.  At this point, patient unable to provide any history. I was able to reach the son Mr. Ryland Smoots on the phone and have a detailed conversation. Prior to her first hospitalization this month, she was at an assisted living, wheelchair-bound, able to transfer herself to the portable toilet, communicate her needs, have conversations including joking and some political discussions although her short-term memory had been becoming worse over the past few months to year. No reported seizure activity in the  past.  He did say that she had become very fidgety and had her hands and legs keep moving and fidgeting at most times but that has not happened over the last couple of days.  Palliative medicine team was consulted by the primary team due to patient's failure to thrive and possible goals of care discussion.  The family wanted to speak to neurology prior to making any decisions about transitioning to comfort measures.  For that reason, neurological consultation inpatient was requested   ROS: Unable to ascertain due to patient's altered mental status Past Medical History:  Diagnosis Date  . Degenerative disc disease, lumbar   . Diabetes mellitus without complication (HCC)   . Hypertension   . Renal disorder   . Thyroid disease     History reviewed. No pertinent family history.  Social History:   reports that she has never smoked. She has never used smokeless tobacco. She reports that she does not drink alcohol. No history on file for drug use.  Medications  Current Facility-Administered Medications:  .  acetaminophen (TYLENOL) tablet 650 mg, 650 mg, Oral, Q6H PRN **OR** acetaminophen (TYLENOL) suppository 650 mg, 650 mg, Rectal, Q6H PRN, Laqueta Due, DO, 650 mg at 09/08/20 2100 .  albuterol (PROVENTIL) (2.5 MG/3ML) 0.083% nebulizer solution 2.5 mg, 2.5 mg, Nebulization, Q6H PRN, MacNeil, Richard G, DO .  cefTRIAXone (ROCEPHIN) 1 g in sodium chloride 0.9 % 100 mL IVPB, 1 g, Intravenous, Q24H, Osvaldo Shipper, MD, Last Rate: 200 mL/hr at 09/08/20 2052, 1 g at 09/08/20 2052 .  dextrose 5 % and 0.9 % NaCl with KCl 20 mEq/L infusion, , Intravenous, Continuous, Marguerita Merles [place], [place], Last Rate: 75 mL/hr at 09/08/20 1637, [place]  Bag at 09/08/20 1637 .  enoxaparin (LOVENOX) injection 30 mg, 30 mg, Subcutaneous, Q24H, MacNeil, Richard G, DO, 30 mg at 09/08/20 0909 .  feeding supplement (ENSURE ENLIVE / ENSURE PLUS) liquid 237 mL, 237 mL, Oral, BID BM, Osvaldo ShipperKrishnan, Gokul, MD, 237 mL at 09/08/20  0909 .  haloperidol lactate (HALDOL) injection 2 mg, 2 mg, Intravenous, Q6H PRN, Osvaldo ShipperKrishnan, Gokul, MD, 2 mg at 09/06/20 1337 .  levothyroxine (SYNTHROID) tablet 75 mcg, 75 mcg, Oral, QAC breakfast, Laqueta DueMacNeil, Richard G, DO, 75 mcg at 09/08/20 0553 .  metoprolol tartrate (LOPRESSOR) injection 2.5 mg, 2.5 mg, Intravenous, Q6H PRN, Osvaldo ShipperKrishnan, Gokul, MD .  ondansetron (ZOFRAN) tablet 4 mg, 4 mg, Oral, Q6H PRN **OR** ondansetron (ZOFRAN) injection 4 mg, 4 mg, Intravenous, Q6H PRN, MacNeil, Richard G, DO .  polyethylene glycol (MIRALAX / GLYCOLAX) packet 17 g, 17 g, Oral, Daily PRN, MacNeil, Richard G, DO .  polyvinyl alcohol (LIQUIFILM TEARS) 1.4 % ophthalmic solution 2 drop, 2 drop, Both Eyes, TID, Bell, Michelle T, RPH, 2 drop at 09/08/20 2052 .  valproate (DEPACON) 375 mg in dextrose 5 % 50 mL IVPB, 375 mg, Intravenous, Q8H, Danford BadWofford, Drew A, RPH, Last Rate: 53.8 mL/hr at 09/08/20 2220, 375 mg at 09/08/20 2220   Exam: Current vital signs: BP (!) 155/80 (BP Location: Left Arm)   Pulse 99   Temp (!) 101.3 F (38.5 C)   Resp (!) 22   Ht 5\' 3"  (1.6 m)   Wt 57.1 kg   SpO2 96%   BMI 22.30 kg/m  Vital signs in last 24 hours: Temp:  [97.7 F (36.5 C)-101.3 F (38.5 C)] 101.3 F (38.5 C) (03/23 2036) Pulse Rate:  [85-105] 99 (03/23 2055) Resp:  [20-24] 22 (03/23 2055) BP: (155-163)/(68-80) 155/80 (03/23 2036) SpO2:  [96 %-98 %] 96 % (03/23 2036) General: Sick looking cachectic woman laying in bed sleeping. HEENT: Normocephalic, atraumatic, dry crusted mouth. CVS: Regular rate rhythm Respiratory: Breathing normally and saturating well on room air in spite of her mentation Extremities warm well perfused, bruising on both upper extremities, with trace edema in the bilateral lower extremities Neurological exam Patient was sleeping. Did not open eyes to voice Upon my passively trying to open her eyes to check her pupils, she volitionally resisted eye opening. No gaze preference or  deviation Pupils equal round reactive to light Face appears symmetric Did not follow any commands Did not spontaneously move any of the extremities but to noxious stimulation moaned loudly and symmetrically to noxious stimulation to all fours with minimal withdrawal in all 4 extremities.  Labs I have reviewed labs in epic and the results pertinent to this consultation are:  CBC    Component Value Date/Time   WBC 11.2 (H) 09/08/2020 0516   RBC 4.03 09/08/2020 0516   HGB 12.1 09/08/2020 0516   HCT 38.8 09/08/2020 0516   PLT 149 (L) 09/08/2020 0516   MCV 96.3 09/08/2020 0516   MCH 30.0 09/08/2020 0516   MCHC 31.2 09/08/2020 0516   RDW 14.7 09/08/2020 0516   LYMPHSABS 3.6 09/04/2020 1851   MONOABS 1.1 (H) 09/04/2020 1851   EOSABS 0.3 09/04/2020 1851   BASOSABS 0.1 09/04/2020 1851    CMP     Component Value Date/Time   NA 141 09/08/2020 0516   K 3.9 09/08/2020 0516   CL 110 09/08/2020 0516   CO2 20 (L) 09/08/2020 0516   GLUCOSE 126 (H) 09/08/2020 0516   BUN 27 (H) 09/08/2020 0516   CREATININE 0.87 09/08/2020  0516   CALCIUM 9.1 09/08/2020 0516   PROT 6.8 09/08/2020 0516   ALBUMIN 3.4 (L) 09/08/2020 0516   AST 17 09/08/2020 0516   ALT 12 09/08/2020 0516   ALKPHOS 65 09/08/2020 0516   BILITOT 0.8 09/08/2020 0516   GFRNONAA >60 09/08/2020 0516   GFRAA 40 (L) 01/09/2019 0434  B12 greater than 400 TSH 2.8  Imaging I have reviewed the images obtained: MRI brain with chronic small vessel disease, atrophy.  No acute changes.  Neurodiagnostics: EEG was obtained on 09/06/2020-which showed mild diffuse encephalopathy with superimposed focal neuronal dysfunction in the right greater than left bitemporal regions and occasional isolated right temporal discharges suggestive of an epileptiform focus however no seizure activity was noted.  Assessment:  87 year old woman with above past medical history presenting for evaluation of altered mental status following episodes of what  sounded like unresponsiveness versus syncope and noted to have a UTI and dehydration for which she is being treated with not much improvement in her mentation and her becoming more unresponsive than her baseline. The son reports that about 2 to 3 weeks ago, at her assisted living facility and up until March 17 until she had a syncopal episode that brought her in for the first time, she was wheelchair-bound but able to make her needs known, have a conversation albeit with declining short-term memory. She is currently being treated for UTI with cephalosporins. EEG had concern for bitemporal dysfunction and isolated right temporal discharges without any seizure activity being noted.  Started on Depakote based on phone consult with therapeutic level. Sometimes abnormal EEG patterns can be seen with with cephalosporin toxicity, which can also cause encephalopathy. It is very hard for me to provide a long-term prognostication after 1 assessment, that to in a patient who is acutely sick with a possible UTI and dehydration-all the labs show improvement, brain function lags behind the lab improvement especially in people with poor brain reserve such as an elderly 87 year old. She is also exhibiting at this time signs of failure to thrive,, which likely may be due to the acute illness. Other risk factors are advanced age and polypharmacy in the setting of an acute illness causing encephalopathy. Short-term memory loss that has been ongoing, indicates that she has some sort of neurodegenerative process underlying and that can also present with behavioral disturbances in unfamiliar settings such as hospitals i.e. hospital delirium.  Impression: Multifactorial toxic metabolic encephalopathy Evaluate for polypharmacy Failure to thrive Concern for seizure Evaluate for cephalosporin toxicity Evaluate for underlying seizures  Recommendations: -Repeat an EEG in the morning-ordered -Discontinue  Depakote-ordered -Start on Keppra at low-dose without load-250 twice daily.-ordered -If there is enough concern on the EEG for epileptiform activity or seizures, can make Keppra therapeutic. -Agree with withholding home gabapentin and pain medications at this time. -Avoid Haldol as much as possible.  Can use Seroquel at night if needed. Would not do it tonight, already appears very sedated. -not ordered -Delirium precautions-keep curtains open, redirect as much as possible, allow family to visit in and talk to her and will help and redirection. -Check thiamine level-ordered for AM labs -Start repleting thiamine after checking the level. Please order after lab draw -Consider changing antibiotic to non-cephalosporin (mostly seen with cefepime, but can be seen with the subclass)-not ordered  We will follow the EEG results and update the primary hospitalist team.  If after all the toxic metabolic derangements are optimized, and she continues to not improve, at that point, it should be fair  to consider comfort measures only.  Patient has a detailed advance directives-appreciate palliative medicine and primary team and reviewing that.  The son was very aversive to even the idea of feeding tube etc. should the situation demand that in the future.  Preliminarily discussed with Dr. Richardson Chiquito at the end of his shift.  -- Milon Dikes, MD Neurologist [facility] Pager: [phone]

## 2020-09-08 NOTE — Progress Notes (Signed)
SLP Cancellation Note  Patient Details Name: Lori Ellis MRN: [id number] DOB: 1921/05/26   Cancelled treatment:       Reason Eval/Treat Not Completed: Other (comment) (pt is somnolent per OT, will continue efforts.)  Rolena Infante, MS [facility] SLP Acute Rehab Services Office [phone] Pager [phone]   Chales Abrahams 09/08/2020, 11:41 AM

## 2020-09-08 NOTE — Progress Notes (Signed)
PROGRESS NOTE    Lori Ellis  GYI:[id number] DOB: September 06, 1920 DOA: 09/04/2020 PCP: Truett Perna, MD   Brief Narrative:  The patient is a 87 year old female with past medical history significant for but not limited to essential hypertension, type 2 diabetes mellitus, peripheral neuropathy, history of spinal stenosis and chronic pain, hypothyroidism, vitamin D deficiency as well as other comorbidities who presented to the emergency room on 09/04/2020 with worsening confusion.  She currently lives in assisted living facility is recently hospitalized for syncopal episode and found to have urinary tract infection at that time of discharged on cefdinir.  She is brought back into the hospital by her son due to worsening mentation.  UA was noted to be abnormal.  She was continued on ceftriaxone.  MRI brain was obtained given her encephalopathy and did not show any acute infarct but did show atrophy and chronic small vessel disease and chronic infarcts noted.  EEG was done and was suggestive of epileptiform activity in the right temporal lobe.  Patient was less responsive today and palliative recommending transitioning to focus to a more comfort approach.  Neurology to evaluate prior to the family make any final decisions about transition to comfort care.  Assessment & Plan:   Active Problems:   Hypothyroidism   Type II diabetes mellitus with renal manifestations (HCC)   Benign essential HTN   Acute lower UTI   Metabolic encephalopathy   AKI (acute kidney injury) (HCC)   Acute metabolic encephalopathy  Acute Metabolic Ecephalopathy/recent vasovagal syncope -Likely multifactorial including incompletely treated UTI, dehydration and acute kidney injury.   -Patient did not exhibit any focal deficits.   -CT head did not show any focal findings.   -Patient was hospitalized a few days ago for syncope thought to be due to vasovagal.   -Further work-up was pursued.  MRI brain was done yesterday which shows  atrophy and findings suggestive of remote infarcts.  No acute infarct identified. -Folic acid B12 level was normal.   -TSH was normal when checked recently. -Continue to treat UTI  -EEG done on 09/06/2020 did raise concern for epileptiform activity in the right temporal area; patient was initiated on loaded with IV Depakote and started on p.o. Depakote however her mentation has significantly worsened and she is more somnolent and difficult to arouse so the Depakote was changed back to IV -Neurology formally consulted for further evaluation recommendations -Palliative care is also following and if neurology no further recommendations and the recommendation would be to transition the patient to comfort care  Concern for Epilepsy -Patient underwent EEG 3/21 which does raise concern for epileptiform activity in the right temporal area.   -Discussed with Dr. Otelia Limes with neurology who recommends loading with Depakote and then starting 3 times a day dosage with 375 mcg.   -Ammonia level normal.  LFTs normal.   -Check Depakote level in the morning and 163 -Neurology was formally consulted given family request because she has not improved with the Depakote that was initiated -She was placed on a full liquid diet but unfortunately has not been able to eat given her encephalopathy  Recently Diagnosed UTI -Urine cultures growing E. coli.   -Noted to be pansensitive.   -Continue IV ceftriaxone for now.   -Follow-up on blood cultures.  Plan for 5-day course.  Acute kidney injury on chronic kidney disease stage IIIb/hypokalemia Metabolic Acidosis  -Likely dehydrated from poor oral intake.   -Baseline creatinine around 1.  Lisinopril is on hold.   -Patient's creatinine  has improved with IV hydration.   -Continue gentle IV fluids and fluids have been resumed to D5 normal saline at 75 mL's per hour +20 mEq of KCl.   -Patient has a mild metabolic acidosis with a CO2 of 20, anion gap of 11, chloride level  of 110 -Patient's oral intake remains poor and she was not awake enough to even participate in examination today.   -Continue to monitor and trend and avoid nephrotoxic medications, contrast dyes, hypotension and renally dose medications-repeat CMP in a.m.  Essential Hypertension -Unable to take oral meds due to her alteration in mental status.   -Use hydralazine as needed.   -Blood pressure is reasonably well controlled.  Occasional high readings noted. -Continue to monitor blood pressures per protocol and last blood pressure reading was 163/72  Hypothyroidism -TSH was 2.8 on 3/17 -Continue with levothyroxine 75 mcg if able but if not we will need to transition to IV.  Diabetes mellitus type 2, non-insulin-dependent HbA1c 6.6 when checked recently.  Check glucose levels once a day.  No SSI for Now.  Chronic low back pain/lumbar spinal stenosis -Tramadol is being held currently .  PT and OT but she is not awake enough to even participate today. -Usually gets around in a wheelchair at baseline.  History of peripheral neuropathy -On gabapentin at home which is currently on hold due to her encephalopathy and her inability to swallow  Leukocytosis -Mild -Patient's WBC went from 11.1 -> 10.4 -> 10.1 -> 11.2 -Continue to monitor for signs and symptoms of infection she is on IV ceftriaxone for now -Repeat CBC in a.m.   Thrombocytopenia -Patient's platelet count has steadily dropped and went from 231 -> 171 -> 157 -> 149 -Unclear etiology  -Continue to monitor for signs and symptoms of bleeding; currently no overt bleeding noted -Repeat CBC in a.m.  Goals of Care -Dr. Rito EhrlichKrishnan had a Long discussion with patient's son as did I this morning.   -Patient is DNR -Prognosis is guarded at this time.   -Patient has not improved the initiation of Depakote.   -She has not shown much improvement in the last 4 days otherwise.   -After discussion with patient's son yesterday palliative Care  has been consulted and family requesting continuing current plan of care with pending neurology input.  If no further treatment options are available they would focus on the patient's comfort and symptom management allow her to be at peace with what time she is left -Likely the patient would not be able to [facility]  DVT prophylaxis: Enoxaparin 30 mg sq q24h Code Status: DO NOT RESUSCITATE Family Communication: Spoke to her Son at bedside  Disposition Plan: Pending further clinical improvement; patient has a very high risk for decompensation given her worsening status and palliative care is involved in goals of care discussion.  Neurology to weigh in and if they have no further recommendations patient is appropriate to be transition to hospice care given her advanced age, frail status and comorbidities given her lack of improvement  Status is: Inpatient  Remains inpatient appropriate because:Unsafe d/c plan, IV treatments appropriate due to intensity of illness or inability to take PO and Inpatient level of care appropriate due to severity of illness   Dispo:  Patient From: Assisted Living Facility  Planned Disposition: Skilled Nursing Facility  Medically stable for discharge: No     Consultants:   Palliative Care    Procedures:  EEG  This is an abnormal  waking and drowsy study due to 1) attenuated organization as described above suggestive of a mild diffuse encephalopathy, with 2) superimposed focal neuronal dysfunction in the right greater than left bitemporal region, and 3) occasional, isolated right temporal discharges suggestive of an epileptiform foci in this region.  There was no evidence of seizure activity however.   Antimicrobials:  Anti-infectives (From admission, onward)   Start     Dose/Rate Route Frequency Ordered Stop   09/05/20 2100  cefTRIAXone (ROCEPHIN) 1 g in sodium chloride 0.9 % 100 mL IVPB        1 g 200 mL/hr over 30  Minutes Intravenous Every 24 hours 09/04/20 2334 09/10/20 2059   09/04/20 2045  cefTRIAXone (ROCEPHIN) 1 g in sodium chloride 0.9 % 100 mL IVPB        1 g 200 mL/hr over 30 Minutes Intravenous  Once 09/04/20 2031 09/05/20 0027        Subjective: Seen and examined at bedside and she is not awake today and did not arouse to sternal rub.  Does not respond to voice or tactile stimuli either.  Appears very somnolent and drowsy and unable to provide a subjective history.  Son states that she briefly opened her eyes and acknowledged him but then went back into the state that she is.  Nursing states that she has had a very poor appetite and awoke this morning to take her Depakote but then is very sleepy and has not been eating anything all day.  Objective: Vitals:   09/07/20 1411 09/07/20 1540 09/07/20 2249 09/08/20 0620  BP: (!) 152/66  (!) 158/68 (!) 163/72  Pulse: 88  85 88  Resp: 16  (!) 22 20  Temp: 99.3 F (37.4 C) 98.4 F (36.9 C) 97.7 F (36.5 C) 98.8 F (37.1 C)  TempSrc: Axillary Oral  Axillary  SpO2: 98%  98% 97%  Weight:      Height:        Intake/Output Summary (Last 24 hours) at 09/08/2020 0750 Last data filed at 09/07/2020 1500 Gross per 24 hour  Intake 259.17 ml  Output 650 ml  Net -390.83 ml   Filed Weights   09/04/20 1806 09/05/20 0124  Weight: 58.5 kg 57.1 kg   Examination: Physical Exam:  Constitutional: Thin chronically ill-appearing Caucasian female who is extremely somnolent and difficult to arouse given her current condition Eyes: Lids and conjunctivae normal, sclerae anicteric  ENMT: External Ears, Nose appear normal.  Mucous membranes appear dry Neck: Appears normal, supple, no cervical masses, normal ROM, no appreciable thyromegaly; no JVD Respiratory: Diminished to auscultation bilaterally, no wheezing, rales, rhonchi or crackles. Normal respiratory effort and patient is not tachypenic. No accessory muscle use.  Cardiovascular: RRR, no murmurs / rubs /  gallops. S1 and S2 auscultated.  No appreciable extremity Abdomen: Soft, non-tender, non-distended. Bowel sounds positive.  GU: Deferred. Musculoskeletal: No clubbing / cyanosis of digits/nails. No joint deformity upper and lower extremities.  Skin: No rashes, lesions, ulcers on limited skin evaluation. No induration; Warm and dry.  Neurologic: Does not follow commands and is not awake enough to participate in neuro examination Psychiatric: Impaired judgment and insight.  She is not awake or alert and oriented x 3. Normal mood and appropriate affect.   Data Reviewed: I have personally reviewed following labs and imaging studies  CBC: Recent Labs  Lab 09/02/20 1200 09/02/20 2031 09/04/20 1851 09/05/20 0555 09/06/20 0503 09/07/20 0816 09/08/20 0516  WBC 8.3   < > 12.1* 11.1* 10.4  10.1 11.2*  NEUTROABS 5.3  --  7.1  --   --   --   --   HGB 11.1*   < > 13.9 12.4 12.9 12.4 12.1  HCT 35.6*   < > 43.8 39.8 42.8 39.2 38.8  MCV 96.2   < > 94.6 95.9 100.5* 94.7 96.3  PLT 150   < > 273 231 171 157 149*   < > = values in this interval not displayed.   Basic Metabolic Panel: Recent Labs  Lab 09/04/20 1851 09/05/20 0555 09/06/20 0503 09/07/20 0604 09/08/20 0516  NA 140 142 142 142 141  K 3.6 3.5 3.6 3.3* 3.9  CL 103 108 109 111 110  CO2 24 21* 19* 20* 20*  GLUCOSE 130* 124* 134* 153* 126*  BUN 33* 33* 39* 29* 27*  CREATININE 1.43* 1.24* 1.31* 1.09* 0.87  CALCIUM 9.7 9.1 9.3 9.4 9.1  MG  --   --   --   --  2.1   GFR: Estimated Creatinine Clearance: 30.6 mL/min (by C-G formula based on SCr of 0.87 mg/dL). Liver Function Tests: Recent Labs  Lab 09/04/20 1851 09/05/20 0555 09/06/20 0503 09/07/20 0604 09/08/20 0516  AST 29 26 21 21 17   ALT 16 14 14 12 12   ALKPHOS 80 69 74 62 65  BILITOT 0.9 0.8 1.3* 0.8 0.8  PROT 8.4* 7.1 7.3 6.9 6.8  ALBUMIN 4.6 3.9 4.0 3.6 3.4*   No results for input(s): LIPASE, AMYLASE in the last 168 hours. Recent Labs  Lab 09/07/20 0925  AMMONIA  12   Coagulation Profile: No results for input(s): INR, PROTIME in the last 168 hours. Cardiac Enzymes: No results for input(s): CKTOTAL, CKMB, CKMBINDEX, TROPONINI in the last 168 hours. BNP (last 3 results) No results for input(s): PROBNP in the last 8760 hours. HbA1C: No results for input(s): HGBA1C in the last 72 hours. CBG: Recent Labs  Lab 09/04/20 1835  GLUCAP 137*   Lipid Profile: No results for input(s): CHOL, HDL, LDLCALC, TRIG, CHOLHDL, LDLDIRECT in the last 72 hours. Thyroid Function Tests: No results for input(s): TSH, T4TOTAL, FREET4, T3FREE, THYROIDAB in the last 72 hours. Anemia Panel: Recent Labs    09/06/20 0503  VITAMINB12 450  FOLATE 16.4   Sepsis Labs: Recent Labs  Lab 09/04/20 2030  LATICACIDVEN 1.3    Recent Results (from the past 240 hour(s))  SARS CORONAVIRUS 2 (TAT 6-24 HRS) Nasopharyngeal Nasopharyngeal Swab     Status: None   Collection Time: 09/02/20 12:00 PM   Specimen: Nasopharyngeal Swab  Result Value Ref Range Status   SARS Coronavirus 2 NEGATIVE NEGATIVE Final    Comment: (NOTE) SARS-CoV-2 target nucleic acids are NOT DETECTED.  The SARS-CoV-2 RNA is generally detectable in upper and lower respiratory specimens during the acute phase of infection. Negative results do not preclude SARS-CoV-2 infection, do not rule out co-infections with other pathogens, and should not be used as the sole basis for treatment or other patient management decisions. Negative results must be combined with clinical observations, patient history, and epidemiological information. The expected result is Negative.  Fact Sheet for Patients: 09/06/20  Fact Sheet for Healthcare Providers: 09/04/20  This test is not yet approved or cleared by the [place] FDA and  has been authorized for detection and/or diagnosis of SARS-CoV-2 by FDA under an Emergency Use Authorization (EUA). This  EUA will remain  in effect (meaning this test can be used) for the duration of the COVID-19 declaration under Se ction  564(b)(1) of the Act, 21 U.S.C. section 360bbb-3(b)(1), unless the authorization is terminated or revoked sooner.  Performed at [facility], [address] [place], [place] [address]   Culture, Urine     Status: Abnormal   Collection Time: 09/02/20  7:15 PM   Specimen: Urine, Random  Result Value Ref Range Status   Specimen Description   Final    URINE, RANDOM Performed at [facility], [address] [place], [place] [address]    Special Requests   Final    NONE Performed at [facility], [address], [place], [place] [address]    Culture >=100,000 COLONIES/mL ESCHERICHIA COLI (A)  Final   Report Status 09/05/2020 FINAL  Final   Organism ID, Bacteria ESCHERICHIA COLI (A)  Final      Susceptibility   Escherichia coli - MIC*    AMPICILLIN 8 SENSITIVE Sensitive     CEFAZOLIN <=4 SENSITIVE Sensitive     CEFEPIME <=0.12 SENSITIVE Sensitive     CEFTRIAXONE <=0.25 SENSITIVE Sensitive     CIPROFLOXACIN <=0.25 SENSITIVE Sensitive     GENTAMICIN <=1 SENSITIVE Sensitive     IMIPENEM <=0.25 SENSITIVE Sensitive     NITROFURANTOIN <=16 SENSITIVE Sensitive     TRIMETH/SULFA <=20 SENSITIVE Sensitive     AMPICILLIN/SULBACTAM 4 SENSITIVE Sensitive     PIP/TAZO <=4 SENSITIVE Sensitive     * >=100,000 COLONIES/mL ESCHERICHIA COLI  Urine Culture     Status: Abnormal   Collection Time: 09/04/20  6:51 PM   Specimen: Urine, Random  Result Value Ref Range Status   Specimen Description   Final    URINE, RANDOM Performed at [facility], [address] [place], [place] [address]    Special Requests   Final    NONE Performed at [facility], [address] [place], [place] [address]    Culture >=100,000 COLONIES/mL ESCHERICHIA COLI (A)  Final   Report Status 09/07/2020 FINAL  Final    Organism ID, Bacteria ESCHERICHIA COLI (A)  Final      Susceptibility   Escherichia coli - MIC*    AMPICILLIN 8 SENSITIVE Sensitive     CEFAZOLIN <=4 SENSITIVE Sensitive     CEFEPIME <=0.12 SENSITIVE Sensitive     CEFTRIAXONE <=0.25 SENSITIVE Sensitive     CIPROFLOXACIN <=0.25 SENSITIVE Sensitive     GENTAMICIN <=1 SENSITIVE Sensitive     IMIPENEM <=0.25 SENSITIVE Sensitive     NITROFURANTOIN <=16 SENSITIVE Sensitive     TRIMETH/SULFA <=20 SENSITIVE Sensitive     AMPICILLIN/SULBACTAM 4 SENSITIVE Sensitive     PIP/TAZO <=4 SENSITIVE Sensitive     * >=100,000 COLONIES/mL ESCHERICHIA COLI  Resp Panel by RT-PCR (Flu A&B, Covid) Nasopharyngeal Swab     Status: None   Collection Time: 09/04/20  9:25 PM   Specimen: Nasopharyngeal Swab; Nasopharyngeal(NP) swabs in vial transport medium  Result Value Ref Range Status   SARS Coronavirus 2 by RT PCR NEGATIVE NEGATIVE Final    Comment: (NOTE) SARS-CoV-2 target nucleic acids are NOT DETECTED.  The SARS-CoV-2 RNA is generally detectable in upper respiratory specimens during the acute phase of infection. The lowest concentration of SARS-CoV-2 viral copies this assay can detect is 138 copies/mL. A negative result does not preclude SARS-Cov-2 infection and should not be used as the sole basis for treatment or other patient management decisions. A negative result may occur with  improper specimen collection/handling, submission of specimen other than nasopharyngeal swab, presence of  viral mutation(s) within the areas targeted by this assay, and inadequate number of viral copies(<138 copies/mL). A negative result must be combined with clinical observations, patient history, and epidemiological information. The expected result is Negative.  Fact Sheet for Patients:  BloggerCourse.com  Fact Sheet for Healthcare Providers:  SeriousBroker.it  This test is no t yet approved or cleared by the [place] FDA and  has been authorized for detection and/or diagnosis of SARS-CoV-2 by FDA under an Emergency Use Authorization (EUA). This EUA will remain  in effect (meaning this test can be used) for the duration of the COVID-19 declaration under Section 564(b)(1) of the Act, 21 U.S.C.section 360bbb-3(b)(1), unless the authorization is terminated  or revoked sooner.       Influenza A by PCR NEGATIVE NEGATIVE Final   Influenza B by PCR NEGATIVE NEGATIVE Final    Comment: (NOTE) The Xpert Xpress SARS-CoV-2/FLU/RSV plus assay is intended as an aid in the diagnosis of influenza from Nasopharyngeal swab specimens and should not be used as a sole basis for treatment. Nasal washings and aspirates are unacceptable for Xpert Xpress SARS-CoV-2/FLU/RSV testing.  Fact Sheet for Patients: BloggerCourse.com  Fact Sheet for Healthcare Providers: SeriousBroker.it  This test is not yet approved or cleared by the [place] FDA and has been authorized for detection and/or diagnosis of SARS-CoV-2 by FDA under an Emergency Use Authorization (EUA). This EUA will remain in effect (meaning this test can be used) for the duration of the COVID-19 declaration under Section 564(b)(1) of the Act, 21 U.S.C. section 360bbb-3(b)(1), unless the authorization is terminated or revoked.  Performed at [facility], [address], [place], [place] [address]   Blood culture (routine x 2)     Status: None (Preliminary result)   Collection Time: 09/04/20  9:40 PM   Specimen: BLOOD  Result Value Ref Range Status   Specimen Description   Final    BLOOD SITE NOT SPECIFIED Performed at [facility], [address] [place], [place] [address]    Special Requests   Final    BOTTLES DRAWN AEROBIC AND ANAEROBIC Blood Culture adequate volume Performed at [facility], [address] [place], [place] [address]    Culture    Final    NO GROWTH 3 DAYS Performed at [facility], [address] [place], [place] [address]    Report Status PENDING  Incomplete    RN Pressure Injury Documentation:    Estimated body mass index is 22.3 kg/m as calculated from the following:   Height as of this encounter:  (1.6 m).   Weight as of this encounter: 57.1 kg.  Malnutrition Type:  Nutrition Problem: Inadequate oral intake Etiology: acute illness (infection, poor appetite and limited fluid acceptance)  Malnutrition Characteristics:  Signs/Symptoms: per patient/family report (son reports poor intake x 3 days, per MD- UTI, dehydration, AKI on admission)  Nutrition Interventions:  Interventions: Ensure Enlive (each supplement provides 350kcal and 20 grams of protein),Magic cup,Liberalize Diet   Radiology Studies: EEG adult  Result Date: 2020/09/28 Lilian Coma, MD     09-28-20  9:45 PM EEG Report Indication: possible seizure activity This study was recorded in the waking/drowsy state.  The duration of the study was 23 minutes.  Electrodes were placed according to the International 10/20 system.  Video was reviewed for clinical correlation as needed.  In the waking state, some degree of background organization is seen with an attenuated but perceptible anterior -  posterior voltage and frequency gradient.  In the occipital leads there is a symmetric and reactive posterior dominant rhythm of approximately 7 hertz, which is slower than expected for age.  Anteriorly, is the expected pattern of faster frequency, lower voltage waveforms though in a significantly reduced amount as compared to normal.  During the drowsy state, there is attenuation of the gradient, a general shift to slower frequencies diffusely to the theta range/diminished alpha, a waxing and waning of the posterior dominant rhythm, and slow roving eye movements.  No clear sleep transients were seen.  The most notable findings however were 1)  slowing involving reduced faster frequencies (in the left temporal) and additionally intermixed low-moderate amplitude theta in the right temporal region, and 2) intermittent sharp waves in the right temporal region and an isolated likely epileptiform discharge (14:17:35).  Wickets are possible, but seem less likely given the associated slowing, lack of a classic up/downslope morphology or occurrence in a theta-frequency cluster. Hyperventilation: deferred Photic stimulation: deferred  Impression:  This is an abnormal waking and drowsy study due to 1) attenuated organization as described above suggestive of a mild diffuse encephalopathy, with 2) superimposed focal neuronal dysfunction in the right greater than left bitemporal region, and 3) occasional, isolated right temporal discharges suggestive of an epileptiform foci in this region.  There was no evidence of seizure activity however.   Scheduled Meds: . divalproex  375 mg Oral Q8H  . enoxaparin (LOVENOX) injection  30 mg Subcutaneous Q24H  . feeding supplement  237 mL Oral BID BM  . levothyroxine  75 mcg Oral QAC breakfast  . polyvinyl alcohol  2 drop Both Eyes TID   Continuous Infusions: . 0.45 % NaCl with KCl 20 mEq / L 50 mL/hr at 09/07/20 1301  . cefTRIAXone (ROCEPHIN)  IV 1 g (09/07/20 2038)    LOS: 3 days   Merlene Laughter, DO [facility] PAGER is on AMION  If 7PM-7AM, please contact night-coverage [email or web address]

## 2020-09-08 NOTE — Care Management Important Message (Signed)
Important Message  Patient Details IM Letter given to the Patient. Name: Lori Ellis MRN: [id number] Date of Birth: 01-May-1921   Medicare Important Message Given:  Yes     Caren Macadam 09/08/2020, 12:48 PM

## 2020-09-08 NOTE — Progress Notes (Signed)
Physical Therapy Discharge Patient Details Name: Lori Ellis MRN: [id number] DOB: Nov 04, 1920 Today's Date: 09/08/2020 Time:  -     Patient discharged from PT services secondary to medical decline - will need to re-order PT to resume therapy services. Patient has not been able to [articipate in therapy. Son at bedside, states patient is not responding to his attempt to awaken her. Palliative medicine is following for GOC.   GP     Rada Hay 09/08/2020, 1:21 PM Blanchard Kelch PT Acute Rehabilitation Services Pager [phone] Office [phone]

## 2020-09-08 NOTE — Progress Notes (Signed)
OT Cancellation Note  Patient Details Name: LOREAL SCHUESSLER MRN: [id number] DOB: 08-Nov-1920   Cancelled Treatment:    Reason Eval/Treat Not Completed: Patient's level of consciousness;Other (comment) patient's family member in room asking therapy to defer, patient is still not responding to stimuli/somnolent and family member is talking about "making her comfortable." Palliative services involved, OT will sign off at this time. If there is a change in patient's medical status please re-consult.  Marlyce Huge OT OT pager: [phone]   Carmelia Roller 09/08/2020, 11:35 AM

## 2020-09-08 NOTE — Plan of Care (Signed)

## 2020-09-09 ENCOUNTER — Inpatient Hospital Stay (HOSPITAL_COMMUNITY)
Admit: 2020-09-09 | Discharge: 2020-09-09 | Disposition: A | Discharge status: Home / Self Care | Payer: PPO | Attending: Student in an Organized Health Care Education/Training Program | Admitting: Student in an Organized Health Care Education/Training Program

## 2020-09-09 DIAGNOSIS — Z515 Encounter for palliative care: Secondary | ICD-10-CM | POA: Diagnosis not present

## 2020-09-09 DIAGNOSIS — Z7189 Other specified counseling: Secondary | ICD-10-CM | POA: Diagnosis not present

## 2020-09-09 DIAGNOSIS — N390 Urinary tract infection, site not specified: Secondary | ICD-10-CM | POA: Diagnosis not present

## 2020-09-09 DIAGNOSIS — Z66 Do not resuscitate: Secondary | ICD-10-CM | POA: Diagnosis not present

## 2020-09-09 DIAGNOSIS — E1122 Type 2 diabetes mellitus with diabetic chronic kidney disease: Secondary | ICD-10-CM | POA: Diagnosis not present

## 2020-09-09 DIAGNOSIS — R5383 Other fatigue: Secondary | ICD-10-CM | POA: Diagnosis not present

## 2020-09-09 DIAGNOSIS — I10 Essential (primary) hypertension: Secondary | ICD-10-CM | POA: Diagnosis not present

## 2020-09-09 LAB — CBC WITH DIFFERENTIAL/PLATELET
Abs Immature Granulocytes: 0.07 10*3/uL (ref 0.00–0.07)
Basophils Absolute: 0 10*3/uL (ref 0.0–0.1)
Basophils Relative: 0 %
Eosinophils Absolute: 0 10*3/uL (ref 0.0–0.5)
Eosinophils Relative: 0 %
HCT: 37.9 % (ref 36.0–46.0)
Hemoglobin: 11.9 g/dL — ABNORMAL LOW (ref 12.0–15.0)
Immature Granulocytes: 1 %
Lymphocytes Relative: 18 %
Lymphs Abs: 2 10*3/uL (ref 0.7–4.0)
MCH: 29.8 pg (ref 26.0–34.0)
MCHC: 31.4 g/dL (ref 30.0–36.0)
MCV: 95 fL (ref 80.0–100.0)
Monocytes Absolute: 1.4 10*3/uL — ABNORMAL HIGH (ref 0.1–1.0)
Monocytes Relative: 13 %
Neutro Abs: 7.4 10*3/uL (ref 1.7–7.7)
Neutrophils Relative %: 68 %
Platelets: 151 10*3/uL (ref 150–400)
RBC: 3.99 MIL/uL (ref 3.87–5.11)
RDW: 14.5 % (ref 11.5–15.5)
WBC: 10.9 10*3/uL — ABNORMAL HIGH (ref 4.0–10.5)
nRBC: 0 % (ref 0.0–0.2)

## 2020-09-09 LAB — COMPREHENSIVE METABOLIC PANEL
ALT: 10 U/L (ref 0–44)
AST: 13 U/L — ABNORMAL LOW (ref 15–41)
Albumin: 3 g/dL — ABNORMAL LOW (ref 3.5–5.0)
Alkaline Phosphatase: 59 U/L (ref 38–126)
Anion gap: 9 (ref 5–15)
BUN: 22 mg/dL (ref 8–23)
CO2: 21 mmol/L — ABNORMAL LOW (ref 22–32)
Calcium: 8.8 mg/dL — ABNORMAL LOW (ref 8.9–10.3)
Chloride: 111 mmol/L (ref 98–111)
Creatinine, Ser: 0.92 mg/dL (ref 0.44–1.00)
GFR, Estimated: 57 mL/min — ABNORMAL LOW (ref >60)
Glucose, Bld: 178 mg/dL — ABNORMAL HIGH (ref 70–99)
Potassium: 3.6 mmol/L (ref 3.5–5.1)
Sodium: 141 mmol/L (ref 135–145)
Total Bilirubin: 0.5 mg/dL (ref 0.3–1.2)
Total Protein: 6.4 g/dL — ABNORMAL LOW (ref 6.5–8.1)

## 2020-09-09 LAB — MAGNESIUM: Magnesium: 2 mg/dL (ref 1.7–2.4)

## 2020-09-09 LAB — PHOSPHORUS: Phosphorus: 2.5 mg/dL (ref 2.5–4.6)

## 2020-09-09 MED ORDER — LEVOTHYROXINE SODIUM 100 MCG/5ML IV SOLN
37.5000 ug | Freq: Every day | INTRAVENOUS | Status: DC
  Administered 2020-09-12 – 2020-09-13 (×2): 37.5 ug via INTRAVENOUS
  Filled 2020-09-09 (×2): qty 5

## 2020-09-09 MED ORDER — THIAMINE HCL 100 MG/ML IJ SOLN
500.0000 mg | Freq: Three times a day (TID) | INTRAVENOUS | Status: AC
  Administered 2020-09-09 – 2020-09-12 (×9): 500 mg via INTRAVENOUS
  Filled 2020-09-09 (×9): qty 5

## 2020-09-09 NOTE — Progress Notes (Signed)
PROGRESS NOTE    Lori Ellis  WUJ:[id number] DOB: 08-03-1920 DOA: 09/04/2020 PCP: Truett Perna, MD   Brief Narrative:  The patient is a 87 year old female with past medical history significant for but not limited to essential hypertension, type 2 diabetes mellitus, peripheral neuropathy, history of spinal stenosis and chronic pain, hypothyroidism, vitamin D deficiency as well as other comorbidities who presented to the emergency room on 09/04/2020 with worsening confusion.  She currently lives in assisted living facility is recently hospitalized for syncopal episode and found to have urinary tract infection at that time of discharged on cefdinir.  She is brought back into the hospital by her son due to worsening mentation.  UA was noted to be abnormal.  She was continued on ceftriaxone.  MRI brain was obtained given her encephalopathy and did not show any acute infarct but did show atrophy and chronic small vessel disease and chronic infarcts noted.  EEG was done and was suggestive of epileptiform activity in the right temporal lobe.  Patient was less responsive yesterday and has further declined today and palliative recommending transitioning to focus to a more comfort approach.  Neurology to evaluate prior to the family make any final decisions about transition to comfort care.  Neurology changed the patient's antiepileptic from Depakote to Keppra and have ordered a repeat EEG they are recommending delirium precautions and avoiding Haldol and checking a thiamine level and repeating thiamine after level has been checked.  Dr. Wilford Corner also recommended changing antibiotics to a non-cephalosporin; overnight she did spike a temperature of 101.3 and blood cultures have been ordered however she is more somnolent and even less responsive today compared to yesterday.  Neurology will follow up on the EEG and if all the toxic metabolic derangements are optimized and she continues not improve then neurology recommends  asked her to consider comfort measures only.  Assessment & Plan:   Active Problems:   Hypothyroidism   Type II diabetes mellitus with renal manifestations (HCC)   Benign essential HTN   Acute lower UTI   Metabolic encephalopathy   AKI (acute kidney injury) (HCC)   Acute metabolic encephalopathy  Acute Metabolic Ecephalopathy/recent vasovagal syncope, worsening -Likely multifactorial including incompletely treated UTI, dehydration and acute kidney injury.   -Patient did not exhibit any focal deficits.   -CT head did not show any focal findings.   -Patient was hospitalized a few days ago for syncope thought to be due to vasovagal.   -Further work-up was pursued.  MRI brain was done yesterday which shows atrophy and findings suggestive of remote infarcts.  No acute infarct identified. -Folic acid B12 level was normal.   -TSH was normal when checked recently. -Continue to treat UTI  -EEG done on 09/06/2020 did raise concern for epileptiform activity in the right temporal area; patient was initiated on loaded with IV Depakote and started on p.o. Depakote however her mentation has significantly worsened and she is more somnolent and difficult to arouse so the Depakote was changed back to IV today and now has been stopped by neurology and changed to Keppra -Neurology formally consulted for further evaluation recommendations and planning on repeating EEG and recommending discontinuation of Depakote and starting the patient on IV Keppra. -Palliative care is also following and if neurology no further recommendations and the recommendation would be to transition the patient to comfort care; neurology discharge optimize all her toxic metabolic derangement and changing and reevaluate her EEG however I feel that she is worsening and continues to decline -  Neurology agrees with holding her home Keppra and pain medications and avoiding Haldol as well and recommending delirium precautions and check thiamine  level and repeating thiamine after checking level -We will give her high-dose IV thiamine after her B1 level is drawn and start 500 mg IV q8h scheduled   Concern for Epilepsy -Patient underwent EEG 3/21 which does raise concern for epileptiform activity in the right temporal area.   -Discussed with Dr. Otelia LimesLindzen with neurology who recommends loading with Depakote and then starting 3 times a day dosage with 375 mcg.    Now Depakote has been stopped and changed to IV Keppra without load at 250 mg twice daily -Ammonia level normal.  LFTs normal.   -Check Depakote level in the morning and was 3663 -Neurology was formally consulted given family request because she has not improved with the Depakote that was initiated and this has been changed to IV Keppra and EEG is being repeated as above -She was placed on a full liquid diet but unfortunately has not been able to eat given her encephalopathy and likely will need to be n.p.o. given that she is not awake enough  Recently Diagnosed UTI -Urine cultures growing E. coli.   -Noted to be pansensitive.   -Continue IV ceftriaxone for now.   -Follow-up on blood cultures.  Plan for 5-day course. -We will respect her temperature overnight of 101.3 so we will repeat blood cultures x2 now -WBC trended up to 11.2 yesterday and today is 10.9  Acute kidney injury on chronic kidney disease stage IIIb/hypokalemia Metabolic Acidosis  -Likely dehydrated from poor oral intake.   -Baseline creatinine around 1.  Lisinopril is on hold.   -Patient's creatinine has improved with IV hydration.   -Continue gentle IV fluids and fluids have been resumed to D5 normal saline at 75 mL's per hour +20 mEq of KCl.   -Patient has a mild metabolic acidosis with a CO2 of 21, anion gap of 9, chloride level of 111 -Patient's oral intake remains poor and she was not awake enough to even participate in examination today.   -Continue to monitor and trend and avoid nephrotoxic medications,  contrast dyes, hypotension and renally dose medications -repeat CMP in a.m.  Essential Hypertension -Unable to take oral meds due to her alteration in mental status.   -Use hydralazine as needed.   -Blood pressure is reasonably well controlled.  Occasional high readings noted. -Continue to monitor blood pressures per protocol and last blood pressure reading was 159/84  Hypothyroidism -TSH was 2.8 on 3/17 -We will change her p.o. levothyroxine to IV given that she is more somnolent and worse off today than she was yesterday  Diabetes mellitus type 2, non-insulin-dependent -HbA1c 6.6 when checked recently.  Check glucose levels once a day.  No SSI for Now. -CBG's ranging from 126-178  Chronic low back pain/lumbar spinal stenosis -Tramadol is being held currently .  PT and OT but she is not awake enough to even participate today. -Usually gets around in a wheelchair at baseline.  History of peripheral neuropathy -On gabapentin at home which is currently on hold due to her encephalopathy and her inability to swallow  Leukocytosis -Mild -Patient's WBC went from 11.1 -> 10.4 -> 10.1 -> 11.2 -> 10.9 -Continue to monitor for signs and symptoms of infection she is on IV ceftriaxone for now -Repeat CBC in a.m.   Thrombocytopenia -Patient's platelet count has steadily dropped and went from 231 -> 171 -> 157 -> 149 -> 151 -  Unclear etiology  -Continue to monitor for signs and symptoms of bleeding; currently no overt bleeding noted -Repeat CBC in a.m.  Goals of Care -Dr. Rito Ehrlich had a Long discussion with patient's son as did I this morning.   -Patient is DNR -Prognosis is guarded at this time.   -Patient has not improved the initiation of Depakote.   -She has not shown much improvement in the last 4 days otherwise.   -After discussion with patient's son yesterday palliative Care has been consulted and family requesting continuing current plan of care with pending neurology input.   If no further treatment options are available they would focus on the patient's comfort and symptom management allow her to be at peace with what time she is left; Continue current care and pending Neuro evaluation she would be Hospice appropriate given that she was worse off today then yesterday  -Likely the patient would NOT be able to return to her assisted living at [facility]  DVT prophylaxis: Enoxaparin 30 mg sq q24h Code Status: DO NOT RESUSCITATE Family Communication: Spoke to her Sons at bedside  Disposition Plan: Pending further clinical improvement; patient has a very high risk for decompensation given her worsening status and palliative care is involved in goals of care discussion.  Neurology to weigh in and if they have no further recommendations patient is appropriate to be transition to hospice care given her advanced age, frail status and comorbidities given her lack of improvement  Status is: Inpatient  Remains inpatient appropriate because:Unsafe d/c plan, IV treatments appropriate due to intensity of illness or inability to take PO and Inpatient level of care appropriate due to severity of illness   Dispo:  Patient From: Assisted Living Facility  Planned Disposition: Skilled Nursing Facility  Medically stable for discharge: No     Consultants:   Palliative Care    Procedures:  EEG  This is an abnormal waking and drowsy study due to 1) attenuated organization as described above suggestive of a mild diffuse encephalopathy, with 2) superimposed focal neuronal dysfunction in the right greater than left bitemporal region, and 3) occasional, isolated right temporal discharges suggestive of an epileptiform foci in this region.  There was no evidence of seizure activity however.  REPEAT EEG 09/09/20 Pending   Antimicrobials:  Anti-infectives (From admission, onward)   Start     Dose/Rate Route Frequency Ordered Stop   09/05/20 2100  cefTRIAXone (ROCEPHIN) 1 g in sodium  chloride 0.9 % 100 mL IVPB        1 g 200 mL/hr over 30 Minutes Intravenous Every 24 hours 09/04/20 2334 09/10/20 2059   09/04/20 2045  cefTRIAXone (ROCEPHIN) 1 g in sodium chloride 0.9 % 100 mL IVPB        1 g 200 mL/hr over 30 Minutes Intravenous  Once 09/04/20 2031 09/05/20 0027        Subjective: Seen and examined at bedside and she was worse off today than compared to yesterday is more somnolent and does not arouse all day to pain for tactile stimuli.  She is resting but does not respond to voice or touch.  She appears comfortable currently.  Family thinks that she is not making any improvement and feels that she is getting worse.  No other concerns or complaints at this time noted.  Objective: Vitals:   09/08/20 2036 09/08/20 2055 09/08/20 2355 09/09/20 0500  BP: (!) 155/80  (!) 146/62 (!) 159/84  Pulse: (!) 105 99 99 93  Resp: (!) 24 (!)  22 (!) 28 20  Temp: (!) 101.3 F (38.5 C)  100.3 F (37.9 C) 98.7 F (37.1 C)  TempSrc:      SpO2: 96%  97%   Weight:      Height:        Intake/Output Summary (Last 24 hours) at 09/09/2020 1339 Last data filed at 09/09/2020 1000 Gross per 24 hour  Intake 2517.92 ml  Output 650 ml  Net 1867.92 ml   Filed Weights   09/04/20 1806 09/05/20 0124  Weight: 58.5 kg 57.1 kg   Examination: Physical Exam:  Constitutional: The patient is a thin chronically ill-appearing Caucasian female who is extremely somnolent and not arousable today and resting.  She does not respond to verbal or physical stimuli Eyes: Her eyes were closed and lids appear normal  ENMT: External Ears, Nose appear normal.  Neck: Appears normal, supple, no cervical masses, normal ROM, no appreciable thyromegaly; no appreciable JVD Respiratory: Diminished to auscultation bilaterally, no wheezing, rales, rhonchi or crackles. Normal respiratory effort and patient is not tachypenic. No accessory muscle use.  Unlabored breathing Cardiovascular: RRR, no murmurs / rubs / gallops.  S1 and S2 auscultated.  Minimal extremity edema Abdomen: Soft, non-tender, non-distended. Bowel sounds positive.  GU: Deferred. Musculoskeletal: No clubbing / cyanosis of digits/nails. No joint deformity upper and lower extremities.  Skin: No rashes, lesions, ulcers noted but has some mild upper extremity bruising. No induration; Warm and dry.  Neurologic: Is not awake or alert and does not follow commands to participate in neuro examination Psychiatric: Impaired judgment and insight.  She is not awake or alert. Normal mood and appropriate affect.   Data Reviewed: I have personally reviewed following labs and imaging studies  CBC: Recent Labs  Lab 09/04/20 1851 09/05/20 0555 09/06/20 0503 09/07/20 0816 09/08/20 0516 09/09/20 0505  WBC 12.1* 11.1* 10.4 10.1 11.2* 10.9*  NEUTROABS 7.1  --   --   --   --  7.4  HGB 13.9 12.4 12.9 12.4 12.1 11.9*  HCT 43.8 39.8 42.8 39.2 38.8 37.9  MCV 94.6 95.9 100.5* 94.7 96.3 95.0  PLT 273 231 171 157 149* 151   Basic Metabolic Panel: Recent Labs  Lab 09/05/20 0555 09/06/20 0503 09/07/20 0604 09/08/20 0516 09/09/20 0505  NA 142 142 142 141 141  K 3.5 3.6 3.3* 3.9 3.6  CL 108 109 111 110 111  CO2 21* 19* 20* 20* 21*  GLUCOSE 124* 134* 153* 126* 178*  BUN 33* 39* 29* 27* 22  CREATININE 1.24* 1.31* 1.09* 0.87 0.92  CALCIUM 9.1 9.3 9.4 9.1 8.8*  MG  --   --   --  2.1 2.0  PHOS  --   --   --   --  2.5   GFR: Estimated Creatinine Clearance: 28.9 mL/min (by C-G formula based on SCr of 0.92 mg/dL). Liver Function Tests: Recent Labs  Lab 09/05/20 0555 09/06/20 0503 09/07/20 0604 09/08/20 0516 09/09/20 0505  AST 13*  ALT ALKPHOS 69 74 62 65 59  BILITOT 0.8 1.3* 0.8 0.8 0.5  PROT 7.1 7.3 6.9 6.8 6.4*  ALBUMIN 3.9 4.0 3.6 3.4* 3.0*   No results for input(s): LIPASE, AMYLASE in the last 168 hours. Recent Labs  Lab 09/07/20 0925  AMMONIA 12   Coagulation Profile: No results for input(s): INR, PROTIME in  the last 168 hours. Cardiac Enzymes: No results for input(s): CKTOTAL, CKMB, CKMBINDEX, TROPONINI in the last 168 hours.  BNP (last 3 results) No results for input(s): PROBNP in the last 8760 hours. HbA1C: No results for input(s): HGBA1C in the last 72 hours. CBG: Recent Labs  Lab 09/04/20 1835  GLUCAP 137*   Lipid Profile: No results for input(s): CHOL, HDL, LDLCALC, TRIG, CHOLHDL, LDLDIRECT in the last 72 hours. Thyroid Function Tests: No results for input(s): TSH, T4TOTAL, FREET4, T3FREE, THYROIDAB in the last 72 hours. Anemia Panel: No results for input(s): VITAMINB12, FOLATE, FERRITIN, TIBC, IRON, RETICCTPCT in the last 72 hours. Sepsis Labs: Recent Labs  Lab 09/04/20 2030  LATICACIDVEN 1.3    Recent Results (from the past 240 hour(s))  SARS CORONAVIRUS 2 (TAT 6-24 HRS) Nasopharyngeal Nasopharyngeal Swab     Status: None   Collection Time: 09/02/20 12:00 PM   Specimen: Nasopharyngeal Swab  Result Value Ref Range Status   SARS Coronavirus 2 NEGATIVE NEGATIVE Final    Comment: (NOTE) SARS-CoV-2 target nucleic acids are NOT DETECTED.  The SARS-CoV-2 RNA is generally detectable in upper and lower respiratory specimens during the acute phase of infection. Negative results do not preclude SARS-CoV-2 infection, do not rule out co-infections with other pathogens, and should not be used as the sole basis for treatment or other patient management decisions. Negative results must be combined with clinical observations, patient history, and epidemiological information. The expected result is Negative.  Fact Sheet for Patients: HairSlick.no  Fact Sheet for Healthcare Providers: quierodirigir.com  This test is not yet approved or cleared by the [place] FDA and  has been authorized for detection and/or diagnosis of SARS-CoV-2 by FDA under an Emergency Use Authorization (EUA). This EUA will remain  in effect (meaning  this test can be used) for the duration of the COVID-19 declaration under Se ction 564(b)(1) of the Act, 21 U.S.C. section 360bbb-3(b)(1), unless the authorization is terminated or revoked sooner.  Performed at [facility], [address] [place], [place] [address]   Culture, Urine     Status: Abnormal   Collection Time: 09/02/20  7:15 PM   Specimen: Urine, Random  Result Value Ref Range Status   Specimen Description   Final    URINE, RANDOM Performed at [facility], [address], [place], [place] [address]    Special Requests   Final    NONE Performed at [facility], [address] [place], [place] [address]    Culture >=100,000 COLONIES/mL ESCHERICHIA COLI (A)  Final   Report Status 09/05/2020 FINAL  Final   Organism ID, Bacteria ESCHERICHIA COLI (A)  Final      Susceptibility   Escherichia coli - MIC*    AMPICILLIN 8 SENSITIVE Sensitive     CEFAZOLIN <=4 SENSITIVE Sensitive     CEFEPIME <=0.12 SENSITIVE Sensitive     CEFTRIAXONE <=0.25 SENSITIVE Sensitive     CIPROFLOXACIN <=0.25 SENSITIVE Sensitive     GENTAMICIN <=1 SENSITIVE Sensitive     IMIPENEM <=0.25 SENSITIVE Sensitive     NITROFURANTOIN <=16 SENSITIVE Sensitive     TRIMETH/SULFA <=20 SENSITIVE Sensitive     AMPICILLIN/SULBACTAM 4 SENSITIVE Sensitive     PIP/TAZO <=4 SENSITIVE Sensitive     * >=100,000 COLONIES/mL ESCHERICHIA COLI  Urine Culture     Status: Abnormal   Collection Time: 09/04/20  6:51 PM   Specimen: Urine, Random  Result Value Ref Range Status   Specimen Description   Final    URINE, RANDOM Performed at [facility], [address], [place], [place] [address]  Special Requests   Final    NONE Performed at [facility], [address], [place], [place] [address]    Culture >=100,000 COLONIES/mL ESCHERICHIA COLI (A)  Final   Report Status 09/07/2020 FINAL  Final   Organism ID, Bacteria ESCHERICHIA COLI  (A)  Final      Susceptibility   Escherichia coli - MIC*    AMPICILLIN 8 SENSITIVE Sensitive     CEFAZOLIN <=4 SENSITIVE Sensitive     CEFEPIME <=0.12 SENSITIVE Sensitive     CEFTRIAXONE <=0.25 SENSITIVE Sensitive     CIPROFLOXACIN <=0.25 SENSITIVE Sensitive     GENTAMICIN <=1 SENSITIVE Sensitive     IMIPENEM <=0.25 SENSITIVE Sensitive     NITROFURANTOIN <=16 SENSITIVE Sensitive     TRIMETH/SULFA <=20 SENSITIVE Sensitive     AMPICILLIN/SULBACTAM 4 SENSITIVE Sensitive     PIP/TAZO <=4 SENSITIVE Sensitive     * >=100,000 COLONIES/mL ESCHERICHIA COLI  Resp Panel by RT-PCR (Flu A&B, Covid) Nasopharyngeal Swab     Status: None   Collection Time: 09/04/20  9:25 PM   Specimen: Nasopharyngeal Swab; Nasopharyngeal(NP) swabs in vial transport medium  Result Value Ref Range Status   SARS Coronavirus 2 by RT PCR NEGATIVE NEGATIVE Final    Comment: (NOTE) SARS-CoV-2 target nucleic acids are NOT DETECTED.  The SARS-CoV-2 RNA is generally detectable in upper respiratory specimens during the acute phase of infection. The lowest concentration of SARS-CoV-2 viral copies this assay can detect is 138 copies/mL. A negative result does not preclude SARS-Cov-2 infection and should not be used as the sole basis for treatment or other patient management decisions. A negative result may occur with  improper specimen collection/handling, submission of specimen other than nasopharyngeal swab, presence of viral mutation(s) within the areas targeted by this assay, and inadequate number of viral copies(<138 copies/mL). A negative result must be combined with clinical observations, patient history, and epidemiological information. The expected result is Negative.  Fact Sheet for Patients:  BloggerCourse.com  Fact Sheet for Healthcare Providers:  SeriousBroker.it  This test is no t yet approved or cleared by the [place] FDA and  has been authorized for  detection and/or diagnosis of SARS-CoV-2 by FDA under an Emergency Use Authorization (EUA). This EUA will remain  in effect (meaning this test can be used) for the duration of the COVID-19 declaration under Section 564(b)(1) of the Act, 21 U.S.C.section 360bbb-3(b)(1), unless the authorization is terminated  or revoked sooner.       Influenza A by PCR NEGATIVE NEGATIVE Final   Influenza B by PCR NEGATIVE NEGATIVE Final    Comment: (NOTE) The Xpert Xpress SARS-CoV-2/FLU/RSV plus assay is intended as an aid in the diagnosis of influenza from Nasopharyngeal swab specimens and should not be used as a sole basis for treatment. Nasal washings and aspirates are unacceptable for Xpert Xpress SARS-CoV-2/FLU/RSV testing.  Fact Sheet for Patients: BloggerCourse.com  Fact Sheet for Healthcare Providers: SeriousBroker.it  This test is not yet approved or cleared by the [place] FDA and has been authorized for detection and/or diagnosis of SARS-CoV-2 by FDA under an Emergency Use Authorization (EUA). This EUA will remain in effect (meaning this test can be used) for the duration of the COVID-19 declaration under Section 564(b)(1) of the Act, 21 U.S.C. section 360bbb-3(b)(1), unless the authorization is terminated or revoked.  Performed at [facility], [address] [place], [place] [address]   Blood culture (routine x 2)     Status: None (Preliminary result)  Collection Time: 09/04/20  9:40 PM   Specimen: BLOOD  Result Value Ref Range Status   Specimen Description   Final    BLOOD SITE NOT SPECIFIED Performed at [facility], [address] [place], [place] [address]    Special Requests   Final    BOTTLES DRAWN AEROBIC AND ANAEROBIC Blood Culture adequate volume Performed at [facility], [address] [place], [place] [address]    Culture   Final    NO GROWTH 4 DAYS Performed  at [facility], [address] [place], [place] [address]    Report Status PENDING  Incomplete    RN Pressure Injury Documentation:    Estimated body mass index is 22.3 kg/m as calculated from the following:   Height as of this encounter: 5\' 3"  (1.6 m).   Weight as of this encounter: 57.1 kg.  Malnutrition Type:  Nutrition Problem: Inadequate oral intake Etiology: acute illness (infection, poor appetite and limited fluid acceptance)  Malnutrition Characteristics:  Signs/Symptoms: per patient/family report (son reports poor intake x 3 days, per MD- UTI, dehydration, AKI on admission)  Nutrition Interventions:  Interventions: Ensure Enlive (each supplement provides 350kcal and 20 grams of protein),Magic cup,Liberalize Diet   Radiology Studies: No results found. Scheduled Meds: . enoxaparin (LOVENOX) injection  30 mg Subcutaneous Q24H  . feeding supplement  237 mL Oral BID BM  . levothyroxine  75 mcg Oral QAC breakfast  . polyvinyl alcohol  2 drop Both Eyes TID   Continuous Infusions: . cefTRIAXone (ROCEPHIN)  IV Stopped (09/09/20 0740)  . dextrose 5 % and 0.9 % NaCl with KCl 20 mEq/L 75 mL/hr at 09/09/20 0538  . levETIRAcetam 250 mg (09/09/20 1021)    LOS: 4 days   09/11/20, DO [facility] PAGER is on AMION  If 7PM-7AM, please contact night-coverage [email or web address]

## 2020-09-09 NOTE — Plan of Care (Signed)
  Problem: Pain Managment: Goal: General experience of comfort will improve Outcome: Progressing   Problem: Safety: Goal: Ability to remain free from injury will improve Outcome: Progressing   

## 2020-09-09 NOTE — Plan of Care (Signed)

## 2020-09-09 NOTE — Procedures (Signed)
EEG Report Indication: possible seizure activity--possible episodes of syncope/R temporal discharges on prior EEG  This study was recorded in the waking/drowsy state.  The duration of the study was 24 minutes.  Electrodes were placed according to the International 10/20 system.  Video was reviewed/available for clinical correlation as needed.  The waking state was only fairly briefly seen (she became agitated on waking)--during this short (<2 minute) period, there was significant muscle artifact overlying discernible but diminished background organization.  There was a mildly attenuated anterior - posterior voltage and frequency gradient.  In the occipital leads there was a symmetric and reactive although indistinct and poorly sustained posterior dominant rhythm of approximately  7 hertz, which is slower than expected for age  Anteriorly, is the expected pattern of faster frequency, lower voltage waveforms, although in a reduced amount as compared to normal.  During the drowsy state, there is further attenuation of the gradient, a general shift to slower frequencies diffusely, a waxing and waning of the posterior dominant rhythm, and slow roving eye movements.  The drowsy architecture is somewhat odd with mainly alpha/theta slowing diffusely--somewhat slower than previously.  The most salient findings however were 1) mild slowing consisting of lower amplitude theta/delta frequencies with overlying/intermixed faster frequencies, most prominent in the temporal regions (F7, T7, P7 and homologous leads in the right hemisphere)--fairly symmetric during this recording, and 2) sharp waveforms seen intermittently and independently in the bitemporal region during drowsiness (which is the majority of this recording) only--sometimes in isolation, sometimes in short clusters which hwave a theta vs alpha (usually 6-7 Hz) rhyhm, an arciform vs curved up/downslope which does not stand out from bakcground frequencies and  other features suggestive more of wicket spikes.  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

## 2020-09-09 NOTE — Progress Notes (Signed)
EEG Completed; Results Pending  

## 2020-09-09 NOTE — Progress Notes (Signed)
   Daily Progress Note   Patient Name: Lori Ellis       Date: 09/09/2020 DOB: 1921-01-29  Age: 87 y.o. MRN#: [id number] Attending Physician: Merlene Laughter, DO Primary Care Physician: Truett Perna, MD Admit Date: 09/04/2020  Reason for Consultation/Follow-up: Establishing goals of care  Subjective: Somnolent. Opens eyes intermittently with no stimuli. Will not follow commands. Appears comfortable.   Initially no family at the bedside however after a recent visit son at the bedside along with [facility] performing EEG.   Updates provided to family. They are remaining somewhat hopeful for some improvement with neurological monitoring and interventions but also is remaining realistic in understanding patient may not improve.   Family request for watchful waiting and further updates from test prior to making any final decisions which is reasonable. They would like to treat the treatable allowing patient an opportunity to improve if possible.   Son is requesting follow up meeting later tomorrow or over the weekend if no improvement, no further neuro recommendations, or response to treatment. Patient's other son is coming in from [facility] Friday as well.   Education and discussion regarding best case and worst case scenario. Family verbalized understanding and appreciation of continued support.   All questions answered and support provided.     Length of Stay: 4 days  Vital Signs: BP (!) 159/84 (BP Location: Left Arm)   Pulse 93   Temp 98.7 F (37.1 C)   Resp 20   Ht 5\' 3"  (1.6 m)   Wt 57.1 kg   SpO2 97%   BMI 22.30 kg/m  SpO2: SpO2: 97 % O2 Device: O2 Device: Room Air O2 Flow Rate:    Physical Exam: Somnolent, will open eyes intermittently, no verbal response ill-appearing Diminished bilaterally RRR Does not follow commands  Palliative Care Assessment & Plan  HPI: Palliative Care consult requested for goals of care discussion in this 87 y.o. female with past medical history of  type 2 diabetes, peripheral neuropathy, chronic pain, hypothyroidism, hypertension, and chronic kidney disease stage III.  Patient presented to the ED from [facility] assisted living facility with concerns for altered mental status.  Patient was recently hospitalized for syncopal episodes and also treated for UTI (discharged on antibiotics).  CT of head showed no acute abnormalities.  Chest x-ray unremarkable.  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  Code Status:  DNR  Goals of Care/Recommendations:  Continue current plan of care.  Family is realistic and understanding patient is showing no improvement with further decline.  Would like to continue to treat the treatable and watchful waiting. Hopeful for some improvement.   Family requesting follow up meeting to further discuss goals once clear understanding of prognosis and results of Neuro work-up.   No escalation of care  PMT will continue to support and follow  Prognosis: POOR  Discharge Planning: To Be Determined  Thank you for allowing the Palliative Medicine Team to assist in the care of this patient.  Time Total: 30 min   Visit consisted of counseling and education dealing with the complex and emotionally intense issues of symptom management and palliative care in the setting of serious and potentially life-threatening illness.Greater than 50%  of this time was spent counseling and coordinating care related to the above assessment and plan.  [facility], AGPCNP-BC  Palliative Medicine Team [phone]

## 2020-09-09 NOTE — Progress Notes (Signed)
SLP Cancellation Note  Patient Details Name: Lori Ellis MRN: [id number] DOB: 29-Oct-1920   Cancelled treatment:       Reason Eval/Treat Not Completed: Fatigue/lethargy limiting ability to participate  Note pt with fevers today and palliative notes reviewed.    Rolena Infante, MS [facility] SLP Acute Rehab Services Office [phone] Pager [phone]   Chales Abrahams 09/09/2020, 3:30 PM

## 2020-09-10 DIAGNOSIS — E1122 Type 2 diabetes mellitus with diabetic chronic kidney disease: Secondary | ICD-10-CM | POA: Diagnosis not present

## 2020-09-10 DIAGNOSIS — E039 Hypothyroidism, unspecified: Secondary | ICD-10-CM | POA: Diagnosis not present

## 2020-09-10 DIAGNOSIS — N390 Urinary tract infection, site not specified: Secondary | ICD-10-CM | POA: Diagnosis not present

## 2020-09-10 DIAGNOSIS — N179 Acute kidney failure, unspecified: Secondary | ICD-10-CM | POA: Diagnosis not present

## 2020-09-10 DIAGNOSIS — I10 Essential (primary) hypertension: Secondary | ICD-10-CM | POA: Diagnosis not present

## 2020-09-10 LAB — CBC WITH DIFFERENTIAL/PLATELET
Abs Immature Granulocytes: 0.06 10*3/uL (ref 0.00–0.07)
Basophils Absolute: 0 10*3/uL (ref 0.0–0.1)
Basophils Relative: 0 %
Eosinophils Absolute: 0.1 10*3/uL (ref 0.0–0.5)
Eosinophils Relative: 1 %
HCT: 40.4 % (ref 36.0–46.0)
Hemoglobin: 12.6 g/dL (ref 12.0–15.0)
Immature Granulocytes: 1 %
Lymphocytes Relative: 12 %
Lymphs Abs: 1.3 10*3/uL (ref 0.7–4.0)
MCH: 30.2 pg (ref 26.0–34.0)
MCHC: 31.2 g/dL (ref 30.0–36.0)
MCV: 96.9 fL (ref 80.0–100.0)
Monocytes Absolute: 1.3 10*3/uL — ABNORMAL HIGH (ref 0.1–1.0)
Monocytes Relative: 12 %
Neutro Abs: 7.6 10*3/uL (ref 1.7–7.7)
Neutrophils Relative %: 74 %
Platelets: 138 10*3/uL — ABNORMAL LOW (ref 150–400)
RBC: 4.17 MIL/uL (ref 3.87–5.11)
RDW: 14.3 % (ref 11.5–15.5)
WBC: 10.3 10*3/uL (ref 4.0–10.5)
nRBC: 0 % (ref 0.0–0.2)

## 2020-09-10 LAB — COMPREHENSIVE METABOLIC PANEL
ALT: 10 U/L (ref 0–44)
AST: 13 U/L — ABNORMAL LOW (ref 15–41)
Albumin: 3 g/dL — ABNORMAL LOW (ref 3.5–5.0)
Alkaline Phosphatase: 61 U/L (ref 38–126)
Anion gap: 11 (ref 5–15)
BUN: 15 mg/dL (ref 8–23)
CO2: 18 mmol/L — ABNORMAL LOW (ref 22–32)
Calcium: 8.9 mg/dL (ref 8.9–10.3)
Chloride: 110 mmol/L (ref 98–111)
Creatinine, Ser: 0.63 mg/dL (ref 0.44–1.00)
GFR, Estimated: >60 mL/min (ref >60)
Glucose, Bld: 186 mg/dL — ABNORMAL HIGH (ref 70–99)
Potassium: 3.2 mmol/L — ABNORMAL LOW (ref 3.5–5.1)
Sodium: 139 mmol/L (ref 135–145)
Total Bilirubin: 0.6 mg/dL (ref 0.3–1.2)
Total Protein: 6.7 g/dL (ref 6.5–8.1)

## 2020-09-10 LAB — PHOSPHORUS: Phosphorus: 2 mg/dL — ABNORMAL LOW (ref 2.5–4.6)

## 2020-09-10 LAB — CULTURE, BLOOD (ROUTINE X 2)
Culture: NO GROWTH
Special Requests: ADEQUATE

## 2020-09-10 LAB — MAGNESIUM: Magnesium: 1.9 mg/dL (ref 1.7–2.4)

## 2020-09-10 MED ORDER — POTASSIUM CHLORIDE 10 MEQ/100ML IV SOLN
10.0000 meq | INTRAVENOUS | Status: AC
  Administered 2020-09-10 (×4): 10 meq via INTRAVENOUS
  Filled 2020-09-10 (×4): qty 100

## 2020-09-10 MED ORDER — POTASSIUM PHOSPHATES 15 MMOLE/5ML IV SOLN
20.0000 mmol | Freq: Once | INTRAVENOUS | Status: AC
  Administered 2020-09-10: 20 mmol via INTRAVENOUS
  Filled 2020-09-10: qty 6.67

## 2020-09-10 MED ORDER — SODIUM BICARBONATE 8.4 % IV SOLN
INTRAVENOUS | Status: DC
  Filled 2020-09-10: qty 850
  Filled 2020-09-10: qty 150

## 2020-09-10 NOTE — Progress Notes (Signed)
Patient refused bladder scan and refused for her mouth to be moisturized x3. Patient expressed that she was home and wanted to be left alone. Patient output is 750 ml at this time. Will continue to monitor.   Caprice Red, RN

## 2020-09-10 NOTE — Progress Notes (Signed)
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    A/R: 87 year old female with AMS - EEG not consistent with ongoing seizures, but does reveal bitemporal slowing which is suggestive of bitemporal focal neuronal dysfunction. - Continue low-dose Keppra at 250 mg BID.  - Neurology will sign off. Please call if there are additional questions.   Electronically signed: Dr. Caryl Pina

## 2020-09-10 NOTE — Progress Notes (Signed)
Upon morning rounds, the patient had removed her mitten from the right hand and scratched her left upper arm. I covered the area with Vaseline guaze and wrapped it with kerlix gauze.

## 2020-09-10 NOTE — TOC Progression Note (Signed)
Transition of Care [facility]) - Progression Note    Patient Details  Name: Lori Ellis MRN: [id number] Date of Birth: 1920-12-14  [facility] CM/SW Contact  Tikesha Mort, Olegario Messier, RN Phone Number: 09/10/2020, 2:04 PM  Clinical Narrative: Noted Palliative note-Prognosis poor.Family to visit from out of town. Continue to follow.      Barriers to Discharge: Continued Medical Work up  Expected Discharge Plan and Services        Living arrangements for the past 2 months: Assisted Living Facility                                       Social Determinants of Health (SDOH) Interventions    Readmission Risk Interventions No flowsheet data found.

## 2020-09-10 NOTE — Progress Notes (Signed)
PROGRESS NOTE    Lori Ellis  YQM:[id number] DOB: 1920-07-26 DOA: 09/04/2020 PCP: Truett Perna, MD   Brief Narrative:  The patient is a 87 year old female with past medical history significant for but not limited to essential hypertension, type 2 diabetes mellitus, peripheral neuropathy, history of spinal stenosis and chronic pain, hypothyroidism, vitamin D deficiency as well as other comorbidities who presented to the emergency room on 09/04/2020 with worsening confusion.  She currently lives in assisted living facility is recently hospitalized for syncopal episode and found to have urinary tract infection at that time of discharged on cefdinir.  She is brought back into the hospital by her son due to worsening mentation.  UA was noted to be abnormal.  She was continued on ceftriaxone.  MRI brain was obtained given her encephalopathy and did not show any acute infarct but did show atrophy and chronic small vessel disease and chronic infarcts noted.  EEG was done and was suggestive of epileptiform activity in the right temporal lobe.  Patient was less responsive yesterday and has further declined today and palliative recommending transitioning to focus to a more comfort approach.  Neurology to evaluate prior to the family make any final decisions about transition to comfort care.  Neurology changed the patient's antiepileptic from Depakote to Keppra and have ordered a repeat EEG they are recommending delirium precautions and avoiding Haldol and checking a thiamine level and repeating thiamine after level has been checked.  Dr. Wilford Corner also recommended changing antibiotics to a non-cephalosporin; overnight she did spike a temperature of 101.3 and blood cultures have been ordered however she is more somnolent and even less responsive today compared to yesterday.  Neurology will follow up on the EEG and if all the toxic metabolic derangements are optimized and she continues not improve then neurology recommends  asked her to consider comfort measures only however she is improving now and she is more awake and alert today than she was yesterday and she has been laughing and cracking jokes with her sons.  She is still altered but more awake and alert and will repeat her SLP.  Repeat EEG showed bitemporal slowing suggestive of bitemporal focal neuronal dysfunction but was not consistent with ongoing seizures.  Neurology recommended continuing low-dose Keppra to 50 mg p.o. twice daily  Assessment & Plan:   Active Problems:   Hypothyroidism   Type II diabetes mellitus with renal manifestations (HCC)   Benign essential HTN   Acute lower UTI   Metabolic encephalopathy   AKI (acute kidney injury) (HCC)   Acute metabolic encephalopathy  Acute Metabolic Ecephalopathy/recent vasovagal syncope, improving slowly now -Likely multifactorial including incompletely treated UTI, dehydration and acute kidney injury.   -Patient did not exhibit any focal deficits.   -CT head did not show any focal findings.   -Patient was hospitalized a few days ago for syncope thought to be due to vasovagal.   -Further work-up was pursued.  MRI brain was done yesterday which shows atrophy and findings suggestive of remote infarcts.  No acute infarct identified. -Folic acid B12 level was normal.   -TSH was normal when checked recently. -Continue to treat UTI  -EEG done on 09/06/2020 did raise concern for epileptiform activity in the right temporal area; patient was initiated on loaded with IV Depakote and started on p.o. Depakote however her mentation has significantly worsened and she is more somnolent and difficult to arouse so the Depakote was changed back to IV today and now has been stopped by neurology  and changed to Keppra and they are recommending continue Keppra to 50 mg p.o. twice daily -Neurology formally consulted for further evaluation recommendations repeat EEG was reviewed by the neurologist Dr. Otelia Limes and EEG was not  consistent with ongoing seizures but did reveal bitemporal slowing and suggestive of bitemporal focal neuronal dysfunction -Palliative care is also following and if neurology no further recommendations and the recommendation would be to transition the patient to comfort care; neurology discharge optimize all her toxic metabolic derangement and changing and reevaluate her EEG however I feel that she is worsening and continues to decline -Neurology agrees with holding her home Keppra and pain medications and avoiding Haldol as well and recommending delirium precautions and check thiamine level and repeating thiamine after checking level -We will give her high-dose IV thiamine after her B1 level is drawn and start 500 mg IV q8h scheduled for 3 days and then taper -Patient was much more awake and alert today but still confused a little bit so will obtain SLP evaluation for swallowing  Concern for Epilepsy, ruled oud -Patient underwent EEG 3/21 which does raise concern for epileptiform activity in the right temporal area.   -Discussed with Dr. Otelia Limes with neurology who recommends loading with Depakote and then starting 3 times a day dosage with 375 mcg.    Now Depakote has been stopped and changed to IV Keppra without load at 250 mg twice daily -Ammonia level normal.  LFTs normal.   -Check Depakote level during hospitalization and was 61 -Neurology was formally consulted given family request because she has not improved with the Depakote that was initiated and this has been changed to IV Keppra and EEG is being repeated as above -She was placed on a full liquid diet but unfortunately had not been able to eat given her encephalopathy and will need SLP re-evaluation  Recently Diagnosed UTI -Urine cultures growing E. coli.   -Noted to be pansensitive.   -Continue IV ceftriaxone for now.   -Follow-up on blood cultures.  Plan for 5-day course. -We will respect her temperature overnight of 101.3 so we  will repeat blood cultures x2 now -WBC trended up to 11.2 yesterday and today is 10.9  Acute kidney injury on chronic kidney disease stage IIIb/hypokalemia Metabolic Acidosis  -Likely dehydrated from poor oral intake.   -Baseline creatinine around 1.  Lisinopril is on hold.   -Patient's creatinine has improved with IV hydration.   -Continued gentle IV fluids and fluids had been resumed to D5 normal saline at 75 mL's per hour +20 mEq of KCl but will change to Sodium Bicarbonate gtt in D5W at 75 mL/hr given Acidosis  -Patient has a mild metabolic acidosis and worsened slgihtly as CO2 is 18, anion gap of 11, chloride level of 110 -Patient's oral intake remained poor and she is more awake today so will get SLP re-evaluation -Continue to monitor and trend and avoid nephrotoxic medications, contrast dyes, hypotension and renally dose medications -repeat CMP in a.m.  Essential Hypertension -Unable to take oral meds due to her alteration in mental status.   -Use hydralazine as needed.   -Blood pressure is reasonably well controlled.  Occasional high readings noted. -Continue to monitor blood pressures per protocol and last blood pressure reading was 161/83  Hypothyroidism -TSH was 2.8 on 3/17 -Changed her p.o. levothyroxine to IV yesterday  Diabetes Mellitus Type 2, non-insulin-dependent -HbA1c 6.6 when checked recently.   -Check glucose levels once a day.  No SSI for Now. -CBG's ranging from  126-178  Chronic low back pain/lumbar spinal stenosis -Tramadol is being held currently .  PT and OT but she is not awake enough to even participate today. -Usually gets around in a wheelchair at baseline.  History of peripheral neuropathy -On gabapentin at home which is currently on hold due to her encephalopathy and her inability to swallow  Leukocytosis -Mild -Patient's WBC went from 11.1 -> 10.4 -> 10.1 -> 11.2 -> 10.9 -> 10.3 -Continue to monitor for signs and symptoms of infection she  is on IV ceftriaxone for now -Repeat CBC in a.m.   Hypokalemia -Paitent's K+ was 3.2 -Replete with IV K Phos 20 mmol and IV KCl 40 mEQ x1 -Continue to Monitor and Replete as Necessary -Repeat CMP in the AM   Hypophosphatemia -Patient's Phos Level was 2.0 -Replete with IV K Phos 20 mmol -Continue to Monitor and Replete as Necessary -Repeat Phos Level in the AM   Thrombocytopenia -Patient's platelet count has steadily dropped and went from 231 -> 171 -> 157 -> 149 -> 151 -> 138 -Unclear etiology  -Continue to monitor for signs and symptoms of bleeding; currently no overt bleeding noted -Repeat CBC in a.m.  Goals of Care -Patient is DNR -Prognosis is guarded at this time but she is slowly improving and much more awake today than the last few days.   -Patient has not improved the initiation of Depakote but now is improving on Keppra.   -She has not shown much improvement in the last 4 days otherwise.   -After discussion with patient's son yesterday palliative Care has been consulted and family requesting continuing current plan of care with pending neurology input.  If no further treatment options are available they would focus on the patient's comfort and symptom management allow her to be at peace with what time she is left; Continue current care and Neuro feels she is not having seizures but recommending Keppra 250 mg BID; She is improved and awoke today but if she declines and does not improve she would be Hospice appropriate given that she was worse off today then yesterday  -Likely the patient would NOT be able to return to her assisted living at [facility]; Will need PT/OT to re-evaluate  DVT prophylaxis: Enoxaparin 30 mg sq q24h Code Status: DO NOT RESUSCITATE Family Communication: Spoke to her Sons at bedside  Disposition Plan: Pending further clinical improvement; patient has a very high risk for decompensation given her worsening status and palliative care is involved in goals  of care discussion.  Neurology to weigh in and if they have no further recommendations patient is appropriate to be transition to hospice care given her advanced age, frail status and comorbidities given her lack of improvement  Status is: Inpatient  Remains inpatient appropriate because:Unsafe d/c plan, IV treatments appropriate due to intensity of illness or inability to take PO and Inpatient level of care appropriate due to severity of illness   Dispo:  Patient From: Assisted Living Facility  Planned Disposition: Skilled Nursing Facility  Medically stable for discharge: No     Consultants:   Palliative Care    Procedures:  EEG  This is an abnormal waking and drowsy study due to 1) attenuated organization as described above suggestive of a mild diffuse encephalopathy, with 2) superimposed focal neuronal dysfunction in the right greater than left bitemporal region, and 3) occasional, isolated right temporal discharges suggestive of an epileptiform foci in this region.  There was no evidence of seizure activity however.  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    Antimicrobials:  Anti-infectives (From admission, onward)   Start     Dose/Rate Route Frequency Ordered Stop   09/05/20 2100  cefTRIAXone (ROCEPHIN) 1 g in sodium chloride 0.9 % 100 mL  IVPB        1 g 200 mL/hr over 30 Minutes Intravenous Every 24 hours 09/04/20 2334 09/09/20 2135   09/04/20 2045  cefTRIAXone (ROCEPHIN) 1 g in sodium chloride 0.9 % 100 mL IVPB        1 g 200 mL/hr over 30 Minutes Intravenous  Once 09/04/20 2031 09/05/20 0027        Subjective: Seen and examined at bedside and she is more awake today but still confused but she is actually interacting and talking with her sons and ate some and was laughing and making jokes.  She had gotten out of her mittens and scratched herself in her left arm.  She is more awake today and actually talking today as opposed to last 2 days where she was extremely somnolent and unresponsive.  Family is encouraged by her awakening.  No other concerns or complaints at this time and all the questions the patient's family had were answered to the best my ability.  Objective: Vitals:   09/09/20 0500 09/09/20 2012 09/10/20 0516 09/10/20 1303  BP: (!) 159/84 (!) 157/63 (!) 150/88 (!) 161/83  Pulse: 93 87 88 97  Resp: Temp: 98.7 F (37.1 C) 98.8 F (37.1 C) 97.8 F (36.6 C) 98.5 F (36.9 C)  TempSrc:      SpO2:  100% 99% 99%  Weight:      Height:        Intake/Output Summary (Last 24 hours) at 09/10/2020 1505 Last data filed at 09/10/2020 1500 Gross per 24 hour  Intake 2483.29 ml  Output 1150 ml  Net 1333.29 ml   Filed Weights   09/04/20 1806 09/05/20 0124  Weight: 58.5 kg 57.1 kg   Examination: Physical Exam:  Constitutional: The patient is a thin chronically ill-appearing Caucasian female who is much more awake today than she was yesterday and does interact and answer questions but still confused.  She does respond to physical verbal stimuli  Eyes: PERRL, lids and conjunctivae normal, sclerae anicteric  ENMT: External Ears, Nose appear normal. Grossly normal hearing.  Neck: Appears normal, supple, no cervical masses, normal ROM, no appreciable thyromegaly; no JVD Respiratory: Diminished to  auscultation bilaterally, no wheezing, rales, rhonchi or crackles. Normal respiratory effort and patient is not tachypenic. No accessory muscle use. Unlabored Breathing  Cardiovascular: RRR, no murmurs / rubs / gallops. S1 and S2 auscultated. No extremity edema.  Abdomen: Soft, non-tender, non-distended. Bowel sounds positive.  GU: Deferred. Musculoskeletal: No clubbing / cyanosis of digits/nails. No joint deformity upper and lower extremities but she is wearing mitten restraints Skin: No rashes, lesions, ulcers on limited skin evaluation but she has a scratch in her left arm. No  induration; Warm and dry.  Neurologic: CN 2-12 grossly intact with no focal deficits. Romberg sign cerebellar reflexes not assessed.  Psychiatric: Impaired judgment and insight.  She is awake and alert but not fully oriented x 3. Normal mood and appropriate affect.   Data Reviewed: I have personally reviewed following labs and imaging studies  CBC: Recent Labs  Lab 09/04/20 1851 09/05/20 0555 09/06/20 0503 09/07/20 0816 09/08/20 0516 09/09/20 0505 09/10/20 0750  WBC 12.1*   < > 10.4 10.1 11.2* 10.9* 10.3  NEUTROABS 7.1  --   --   --   --  7.4 7.6  HGB 13.9   < > 12.9 12.4 12.1 11.9* 12.6  HCT 43.8   < > 42.8 39.2 38.8 37.9 40.4  MCV 94.6   < > 100.5* 94.7 96.3 95.0 96.9  PLT 273   < > 171 157 149* 151 138*   < > = values in this interval not displayed.   Basic Metabolic Panel: Recent Labs  Lab 09/06/20 0503 09/07/20 0604 09/08/20 0516 09/09/20 0505 09/10/20 0750  NA 142 142 141 141 139  K 3.6 3.3* 3.9 3.6 3.2*  CL 109 111 110 111 110  CO2 19* 20* 20* 21* 18*  GLUCOSE 134* 153* 126* 178* 186*  BUN 39* 29* 27* 22 15  CREATININE 1.31* 1.09* 0.87 0.92 0.63  CALCIUM 9.3 9.4 9.1 8.8* 8.9  MG  --   --  2.1 2.0 1.9  PHOS  --   --   --  2.5 2.0*   GFR: Estimated Creatinine Clearance: 33.3 mL/min (by C-G formula based on SCr of 0.63 mg/dL). Liver Function Tests: Recent Labs  Lab 09/06/20 0503  09/07/20 0604 09/08/20 0516 09/09/20 0505 09/10/20 0750  AST 13* 13*  ALT ALKPHOS 74 62 65 59 61  BILITOT 1.3* 0.8 0.8 0.5 0.6  PROT 7.3 6.9 6.8 6.4* 6.7  ALBUMIN 4.0 3.6 3.4* 3.0* 3.0*   No results for input(s): LIPASE, AMYLASE in the last 168 hours. Recent Labs  Lab 09/07/20 0925  AMMONIA 12   Coagulation Profile: No results for input(s): INR, PROTIME in the last 168 hours. Cardiac Enzymes: No results for input(s): CKTOTAL, CKMB, CKMBINDEX, TROPONINI in the last 168 hours. BNP (last 3 results) No results for input(s): PROBNP in the last 8760 hours. HbA1C: No results for input(s): HGBA1C in the last 72 hours. CBG: Recent Labs  Lab 09/04/20 1835  GLUCAP 137*   Lipid Profile: No results for input(s): CHOL, HDL, LDLCALC, TRIG, CHOLHDL, LDLDIRECT in the last 72 hours. Thyroid Function Tests: No results for input(s): TSH, T4TOTAL, FREET4, T3FREE, THYROIDAB in the last 72 hours. Anemia Panel: No results for input(s): VITAMINB12, FOLATE, FERRITIN, TIBC, IRON, RETICCTPCT in the last 72 hours. Sepsis Labs: Recent Labs  Lab 09/04/20 2030  LATICACIDVEN 1.3    Recent Results (from the past 240 hour(s))  SARS CORONAVIRUS 2 (TAT 6-24 HRS) Nasopharyngeal Nasopharyngeal Swab     Status: None   Collection Time: 09/02/20 12:00 PM   Specimen: Nasopharyngeal Swab  Result Value Ref Range Status   SARS Coronavirus 2 NEGATIVE NEGATIVE Final    Comment: (NOTE) SARS-CoV-2 target nucleic acids are NOT DETECTED.  The SARS-CoV-2 RNA is generally detectable in upper and lower respiratory specimens during the acute phase of infection. Negative results do not preclude SARS-CoV-2 infection, do not rule out co-infections with other pathogens, and should not be used as the sole basis for treatment  or other patient management decisions. Negative results must be combined with clinical observations, patient history, and epidemiological information. The  expected result is Negative.  Fact Sheet for Patients: HairSlick.no  Fact Sheet for Healthcare Providers: quierodirigir.com  This test is not yet approved or cleared by the [place] FDA and  has been authorized for detection and/or diagnosis of SARS-CoV-2 by FDA under an Emergency Use Authorization (EUA). This EUA will remain  in effect (meaning this test can be used) for the duration of the COVID-19 declaration under Se ction 564(b)(1) of the Act, 21 U.S.C. section 360bbb-3(b)(1), unless the authorization is terminated or revoked sooner.  Performed at [facility], [address], [place], [place] [address]   Culture, Urine     Status: Abnormal   Collection Time: 09/02/20  7:15 PM   Specimen: Urine, Random  Result Value Ref Range Status   Specimen Description   Final    URINE, RANDOM Performed at [facility], [address], [place], [place] [address]    Special Requests   Final    NONE Performed at [facility], [address], [place], [place] [address]    Culture >=100,000 COLONIES/mL ESCHERICHIA COLI (A)  Final   Report Status 09/05/2020 FINAL  Final   Organism ID, Bacteria ESCHERICHIA COLI (A)  Final      Susceptibility   Escherichia coli - MIC*    AMPICILLIN 8 SENSITIVE Sensitive     CEFAZOLIN <=4 SENSITIVE Sensitive     CEFEPIME <=0.12 SENSITIVE Sensitive     CEFTRIAXONE <=0.25 SENSITIVE Sensitive     CIPROFLOXACIN <=0.25 SENSITIVE Sensitive     GENTAMICIN <=1 SENSITIVE Sensitive     IMIPENEM <=0.25 SENSITIVE Sensitive     NITROFURANTOIN <=16 SENSITIVE Sensitive     TRIMETH/SULFA <=20 SENSITIVE Sensitive     AMPICILLIN/SULBACTAM 4 SENSITIVE Sensitive     PIP/TAZO <=4 SENSITIVE Sensitive     * >=100,000 COLONIES/mL ESCHERICHIA COLI  Urine Culture     Status: Abnormal   Collection Time: 09/04/20  6:51 PM   Specimen: Urine, Random  Result Value Ref Range  Status   Specimen Description   Final    URINE, RANDOM Performed at [facility], [address], [place], [place] [address]    Special Requests   Final    NONE Performed at [facility], [address] [place], [place] [address]    Culture >=100,000 COLONIES/mL ESCHERICHIA COLI (A)  Final   Report Status 09/07/2020 FINAL  Final   Organism ID, Bacteria ESCHERICHIA COLI (A)  Final      Susceptibility   Escherichia coli - MIC*    AMPICILLIN 8 SENSITIVE Sensitive     CEFAZOLIN <=4 SENSITIVE Sensitive     CEFEPIME <=0.12 SENSITIVE Sensitive     CEFTRIAXONE <=0.25 SENSITIVE Sensitive     CIPROFLOXACIN <=0.25 SENSITIVE Sensitive     GENTAMICIN <=1 SENSITIVE Sensitive     IMIPENEM <=0.25 SENSITIVE Sensitive     NITROFURANTOIN <=16 SENSITIVE Sensitive     TRIMETH/SULFA <=20 SENSITIVE Sensitive     AMPICILLIN/SULBACTAM 4 SENSITIVE Sensitive     PIP/TAZO <=4 SENSITIVE Sensitive     * >=100,000 COLONIES/mL ESCHERICHIA COLI  Resp Panel by RT-PCR (Flu A&B, Covid) Nasopharyngeal Swab     Status: None   Collection Time: 09/04/20  9:25 PM   Specimen: Nasopharyngeal Swab; Nasopharyngeal(NP) swabs in vial transport medium  Result Value Ref Range Status   SARS Coronavirus  2 by RT PCR NEGATIVE NEGATIVE Final    Comment: (NOTE) SARS-CoV-2 target nucleic acids are NOT DETECTED.  The SARS-CoV-2 RNA is generally detectable in upper respiratory specimens during the acute phase of infection. The lowest concentration of SARS-CoV-2 viral copies this assay can detect is 138 copies/mL. A negative result does not preclude SARS-Cov-2 infection and should not be used as the sole basis for treatment or other patient management decisions. A negative result may occur with  improper specimen collection/handling, submission of specimen other than nasopharyngeal swab, presence of viral mutation(s) within the areas targeted by this assay, and inadequate number of  viral copies(<138 copies/mL). A negative result must be combined with clinical observations, patient history, and epidemiological information. The expected result is Negative.  Fact Sheet for Patients:  BloggerCourse.com  Fact Sheet for Healthcare Providers:  SeriousBroker.it  This test is no t yet approved or cleared by the [place] FDA and  has been authorized for detection and/or diagnosis of SARS-CoV-2 by FDA under an Emergency Use Authorization (EUA). This EUA will remain  in effect (meaning this test can be used) for the duration of the COVID-19 declaration under Section 564(b)(1) of the Act, 21 U.S.C.section 360bbb-3(b)(1), unless the authorization is terminated  or revoked sooner.       Influenza A by PCR NEGATIVE NEGATIVE Final   Influenza B by PCR NEGATIVE NEGATIVE Final    Comment: (NOTE) The Xpert Xpress SARS-CoV-2/FLU/RSV plus assay is intended as an aid in the diagnosis of influenza from Nasopharyngeal swab specimens and should not be used as a sole basis for treatment. Nasal washings and aspirates are unacceptable for Xpert Xpress SARS-CoV-2/FLU/RSV testing.  Fact Sheet for Patients: BloggerCourse.com  Fact Sheet for Healthcare Providers: SeriousBroker.it  This test is not yet approved or cleared by the [place] FDA and has been authorized for detection and/or diagnosis of SARS-CoV-2 by FDA under an Emergency Use Authorization (EUA). This EUA will remain in effect (meaning this test can be used) for the duration of the COVID-19 declaration under Section 564(b)(1) of the Act, 21 U.S.C. section 360bbb-3(b)(1), unless the authorization is terminated or revoked.  Performed at [facility], [address] [place], [place] [address]   Blood culture (routine x 2)     Status: None   Collection Time: 09/04/20  9:40 PM   Specimen: BLOOD   Result Value Ref Range Status   Specimen Description   Final    BLOOD SITE NOT SPECIFIED Performed at [facility], [address] [place], [place] [address]    Special Requests   Final    BOTTLES DRAWN AEROBIC AND ANAEROBIC Blood Culture adequate volume Performed at [facility], [address], [place], [place] [address]    Culture   Final    NO GROWTH 5 DAYS Performed at [facility], [address], [place], [place] [address]    Report Status 09/10/2020 FINAL  Final  Culture, blood (routine x 2)     Status: None (Preliminary result)   Collection Time: 09/09/20 10:08 AM   Specimen: BLOOD  Result Value Ref Range Status   Specimen Description   Final    BLOOD BLOOD LEFT FOREARM Performed at [facility], [address] [place], [place] [address]    Special Requests   Final    BOTTLES DRAWN AEROBIC AND ANAEROBIC Blood Culture adequate volume Performed at [facility], [address], [place], [place] [address]  Culture   Final    NO GROWTH < 24 HOURS [facility] [facility], [address] [place], [place] [address]    Report Status PENDING  Incomplete  Culture, blood (routine x 2)     Status: None (Preliminary result)   Collection Time: 09/09/20 10:10 AM   Specimen: BLOOD  Result Value Ref Range Status   Specimen Description   Final    BLOOD LEFT WRIST Performed at [facility], [address] [place], [place] [address]    Special Requests   Final    BOTTLES DRAWN AEROBIC AND ANAEROBIC Blood Culture adequate volume Performed at [facility], [address] [place], [place] [address]    Culture   Final    NO GROWTH < 24 HOURS Performed at [facility], [address] [place], [place] [address]    Report Status PENDING  Incomplete    RN Pressure Injury Documentation:    Estimated body mass index is 22.3 kg/m as calculated from the  following:   Height as of this encounter: 5\' 3"  (1.6 m).   Weight as of this encounter: 57.1 kg.  Malnutrition Type:  Nutrition Problem: Inadequate oral intake Etiology: acute illness (infection, poor appetite and limited fluid acceptance)  Malnutrition Characteristics:  Signs/Symptoms: per patient/family report (son reports poor intake x 3 days, per MD- UTI, dehydration, AKI on admission)  Nutrition Interventions:  Interventions: Ensure Enlive (each supplement provides 350kcal and 20 grams of protein),Magic cup,Liberalize Diet   Radiology Studies: No results found. Scheduled Meds: . enoxaparin (LOVENOX) injection  30 mg Subcutaneous Q24H  . feeding supplement  237 mL Oral BID BM  . levothyroxine  75 mcg Oral QAC breakfast  . [START ON 09/12/2020] levothyroxine  37.5 mcg Intravenous Daily  . polyvinyl alcohol  2 drop Both Eyes TID   Continuous Infusions: . dextrose 5 % and 0.9 % NaCl with KCl 20 mEq/L 75 mL/hr at 09/09/20 1923  . levETIRAcetam 250 mg (09/10/20 0924)  . potassium chloride 10 mEq (09/10/20 1422)  . potassium PHOSPHATE IVPB (in mmol) 20 mmol (09/10/20 1251)  . thiamine injection 500 mg (09/10/20 0818)    LOS: 5 days   Merlene Laughtermair Latif Sebron Mcmahill, DO [facility] PAGER is on AMION  If 7PM-7AM, please contact night-coverage [email or web address]

## 2020-09-10 NOTE — Progress Notes (Signed)
Speech Language Pathology Discharge Patient Details Name: Lori Ellis MRN: [id number] DOB: Oct 28, 1920 Today's Date: 09/10/2020 Time:  -     Patient discharged from SLP services secondary to medical decline - will need to re-order SLP to resume therapy services.  Please see latest therapy progress note for current level of functioning and progress toward goals.    Progress and discharge plan discussed with patient and/or caregiver: Patient unable to participate in discharge planning and no caregivers available  GO   Rolena Infante, MS [facility] SLP Acute Rehab Services Office [phone] Pager [phone]    Lori Ellis 09/10/2020, 2:37 PM

## 2020-09-10 NOTE — Evaluation (Signed)
Clinical/Bedside Swallow Evaluation Patient Details  Name: Lori Ellis MRN: [id number] Date of Birth: 24-Jul-1920  Today's Date: 09/10/2020 Time: SLP Start Time (ACUTE ONLY): 1650 SLP Stop Time (ACUTE ONLY): 1728 SLP Time Calculation (min) (ACUTE ONLY): 38 min  Past Medical History:  Past Medical History:  Diagnosis Date  . Degenerative disc disease, lumbar   . Diabetes mellitus without complication (HCC)   . Hypertension   . Renal disorder   . Thyroid disease    Past Surgical History:  Past Surgical History:  Procedure Laterality Date  . BLADDER SURGERY    . CHOLECYSTECTOMY    . INTRAMEDULLARY (IM) NAIL INTERTROCHANTERIC Right 01/02/2019   Procedure: INTRAMEDULLARY (IM) NAIL INTERTROCHANTRIC right hip;  Surgeon: Myrene Galas, MD;  Location: [facility] OR;  Service: Orthopedics;  Laterality: Right;   HPI:  Patient is a 87 y.o. female with PMH: essential HTN, DM-2, peripheral neuropathy, spinal stenosis, chronic pain, hypothryroidism, vitamin D deficiency who presented to ER from SNF on 3/19 with metabolic encephalopathy. MRI revealed No acute or reversible finding. Atrophy and small-vessel ischemic changes throughout the brain. CXR revealed probable left lung atelectasis, no focal consolidation, pleural effusion or pnemothorax.   Assessment / Plan / Recommendation Clinical Impression  RN reports pt accepted intake with family present but declined po medications or intake after sons left.  SLP conducted BSE providing pt food items she would attempt.  She continues with indications of mild-mod oral dysphagia likely due to deconditioning with cognitive component. Patient's pain limited degree at which [facility] could be raised and so she was slightly reclined during PO intake. Pt opened her mouth to accept ice cream, potato soup with and without graham cracker, She did not have upper denture in place, though she though she did, and did not let SLP place it for her.  Prolonged mastication noted of even  cracker that was completely moistened by soup.  Oral pocketing on the right noted without pt awareness.  Use of liquid did not clear retained cracker but pudding was effective to clear it.  No coughing, throat clearing or other overt s/s aspiraiton or penetration were observed and patient's voice remained clear throughout session.  Her positioning is suboptimal due to her discomfort and given this with pt's dysarthria, weakness - recommend pt have puree/thin diet for institutionalized feeding and allow family/staff to provide pt with soft solids.  No family present at this time and pt only accepted *with much encouragment* 3 boluses of soup (one mixed with cracker), 3 boluses of ice cream, and a single sip of tea via straw.  Anticipate her intake will remain poor.  Pt verbalized to SLP about her pain and impact on her life, she stated "I can't sleep sometimes because it hurts so bad".  Will follow up for family education, pt tolerance and readiness for advancement of diet - if appropriate.    SLP Visit Diagnosis: Dysphagia, unspecified (R13.10);Dysphagia, oral phase (R13.11)    Aspiration Risk  Mild aspiration risk    Diet Recommendation Thin liquid;Other (Comment);Dysphagia 1 (Puree) (soft foods ok from family and staff)   Medication Administration: Whole meds with liquid Supervision: Full supervision/cueing for compensatory strategies;Staff to assist with self feeding Compensations: Small sips/bites;Slow rate    Other  Recommendations Oral Care Recommendations: Oral care BID;Staff/trained caregiver to provide oral care   Follow up Recommendations Skilled Nursing facility      Frequency and Duration min 2x/week  1 week       Prognosis Prognosis for Safe Diet  Advancement: Good      Swallow Study   General Date of Onset: 09/06/20 HPI: Patient is a 87 y.o. female with PMH: essential HTN, DM-2, peripheral neuropathy, spinal stenosis, chronic pain, hypothryroidism, vitamin D deficiency who  presented to ER from SNF on 3/19 with metabolic encephalopathy. MRI revealed No acute or reversible finding. Atrophy and small-vessel ischemic changes throughout the brain. CXR revealed probable left lung atelectasis, no focal consolidation, pleural effusion or pnemothorax. Type of Study: Bedside Swallow Evaluation Previous Swallow Assessment: BSE four days prior Diet Prior to this Study: Thin liquids;Nectar-thick liquids (full liquids) Temperature Spikes Noted: No Respiratory Status: Room air History of Recent Intubation: No Behavior/Cognition: Alert;Cooperative;Pleasant mood;Confused Oral Care Completed by SLP: Yes Oral Cavity - Dentition: Dentures, top Vision: Impaired for self-feeding Self-Feeding Abilities: Total assist Patient Positioning: Partially reclined (due to pain) Baseline Vocal Quality: Normal;Other (comment) (vocal tremor) Volitional Cough: Cognitively unable to elicit Volitional Swallow: Unable to elicit    Oral/Motor/Sensory Function Overall Oral Motor/Sensory Function: Generalized oral weakness Facial ROM: Within Functional Limits Facial Symmetry: Within Functional Limits Facial Strength: Within Functional Limits Lingual ROM: Within Functional Limits Lingual Symmetry: Within Functional Limits Lingual Strength: Reduced Velum: Within Functional Limits Mandible: Other (Comment) (dnt)   Ice Chips Ice chips: Not tested   Thin Liquid Thin Liquid: Impaired Presentation: Straw Pharyngeal  Phase Impairments: Suspected delayed Swallow Other Comments: no overt s/s    Nectar Thick Nectar Thick Liquid: Not tested   Honey Thick Honey Thick Liquid: Not tested   Puree Puree: Impaired Oral Phase Impairments: Reduced lingual movement/coordination Oral Phase Functional Implications: Prolonged oral transit Pharyngeal Phase Impairments: Other (comments) Other Comments: no overt s/s of pharyngeal deficits   Solid     Solid: Impaired Oral Phase Impairments: Reduced lingual  movement/coordination;Impaired mastication Oral Phase Functional Implications: Oral residue;Right lateral sulci pocketing Other Comments: pt did not want dentures placed to eat and stated "they are in" even after SLP advised inaccurate information      Chales Abrahams 09/10/2020,6:15 PM   Rolena Infante, MS [facility] SLP Acute Rehab Services Office [phone] Pager [phone]

## 2020-09-10 NOTE — Evaluation (Signed)
Physical Therapy RE-Evaluation Patient Details Name: Lori Ellis MRN: [id number] DOB: Feb 02, 1921 Today's Date: 09/10/2020   History of Present Illness  Lori Ellis is a 87 y.o. female with medical history significant of essential hypertension, type 2 diabetes mellitus, peripheral neuropathy, spinal stenosis, chronic pain, hypothyroidism, vitamin D deficiency who presents from her skilled nursing facility after being found on the toilet unresponsive. Palliative medicine following for GOC. Patient has aroused now.  Clinical Impression  Patient awake, stated her full name and  Names of 3 sons. No family present. Patient answered personal questions(? Reliable). Patient did not participate in efforts to mobilize to sitting on bed edge. Patient resisting physically and verbally. Continue PT assessment next visit if patient will participate.  Pt admitted with above diagnosis.  Pt currently with functional limitations due to the deficits listed below (see PT Problem List). Pt will benefit from skilled PT to increase their independence and safety with mobility to allow discharge to the venue listed below.   Patient declined to take any drink or food when offered.      Follow Up Recommendations  (TBD if pt. participates)    Equipment Recommendations  None recommended by PT    Recommendations for Other Services       Precautions / Restrictions Precautions Precautions: Fall Precaution Comments: was not combative but resistive verbally and  bodily  In  attempts to sit up.      Mobility  Bed Mobility               General bed mobility comments: patient resisted any  efforts to roll, move legs to bed edge in prep to sit up. . Pushing Therapist away and drawing legs up.    Transfers                 General transfer comment: NT  Ambulation/Gait                Stairs            Wheelchair Mobility    Modified Rankin (Stroke Patients Only)       Balance        Sitting balance - Comments: NT                                     Pertinent Vitals/Pain Faces Pain Scale: No hurt    Home Living                   Additional Comments: [facility] from ALF, WC dependent    Prior Function           Comments: per chart review patient able to transfer self to wc and assist with ADLs     Hand Dominance        Extremity/Trunk Assessment   Upper Extremity Assessment Upper Extremity Assessment:  (mitts in placed, generally moving hands around.)    Lower Extremity Assessment Lower Extremity Assessment:  (pt did not move her  legs, resistive  when legs were moved towards bed edge.)    Cervical / Trunk Assessment Cervical / Trunk Assessment: Other exceptions (NT as pt. would not participate in sitting.) Cervical / Trunk Exceptions: pt. listing more to right while lying in bed.  Communication      Cognition Arousal/Alertness: Awake/alert Behavior During Therapy: Restless Overall Cognitive Status: Impaired/Different from baseline Area of Impairment: Orientation;Following commands  Orientation Level: Place;Time;Situation             General Comments: patient stated her name, 3 son's names, she is from [place] and father was [place] and mother fr. [place].?  if true, no family present. patient's speech fluent and understandable. Stated"put that rail back up(PT put rail downm). I am not getting up now"      General Comments      Exercises     Assessment/Plan    PT Assessment Patient needs continued PT services (trial for ability to participate consistently)  PT Problem List Decreased mobility;Decreased safety awareness;Decreased cognition;Decreased activity tolerance       PT Treatment Interventions Therapeutic activities;Cognitive remediation;Patient/family education;Functional mobility training    PT Goals (Current goals can be found in the Care Plan section)  Acute Rehab PT  Goals PT Goal Formulation: Patient unable to participate in goal setting Time For Goal Achievement: 09/24/20 Potential to Achieve Goals: Fair    Frequency Min 2X/week   Barriers to discharge        Co-evaluation               AM-PAC PT "6 Clicks" Mobility  Outcome Measure Help needed turning from your back to your side while in a flat bed without using bedrails?: Total Help needed moving from lying on your back to sitting on the side of a flat bed without using bedrails?: Total Help needed moving to and from a bed to a chair (including a wheelchair)?: Total Help needed standing up from a chair using your arms (e.g., wheelchair or bedside chair)?: Total Help needed to walk in hospital room?: Total Help needed climbing 3-5 steps with a railing? : Total 6 Click Score: 6    End of Session   Activity Tolerance: Treatment limited secondary to agitation Patient left: in bed;with call bell/phone within reach;with bed alarm set (mitts on) Nurse Communication: Mobility status PT Visit Diagnosis: Muscle weakness (generalized) (M62.81);History of falling (Z91.81)    Time: 0962-8366 PT Time Calculation (min) (ACUTE ONLY): 16 min   Charges:   PT Evaluation $PT Re-evaluation: 1 Re-eval          ,Blanchard Kelch PT Acute Rehabilitation Services Pager [phone] Office [phone]   Rada Hay 09/10/2020, 4:36 PM

## 2020-09-10 NOTE — Progress Notes (Signed)
PALLIATIVE NOTE:  Palliative Care consult requested for goals of care discussion in this89 y.o.femalewith past medical history oftype 2 diabetes, peripheral neuropathy, chronic pain, hypothyroidism, hypertension, and chronic kidney disease stage III. Patient presented to the ED from [facility] assisted living facility with concerns for altered mental status. Patient was recently hospitalized for syncopal episodes and also treated for UTI (discharged on antibiotics). CT of head showed no acute abnormalities. Chest x-ray unremarkable. 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  Chart Reviewed. Updates Received. Patient assessed at the bedside. Much more awake and alert today. Answers some questions appropriately with some confusion. Offered something to drink however refused. Closes eyes during assessment.   No family at the bedside.   Patient's son planning on visiting from [place] this weekend. Family is requesting watchful waiting prior to final decisions.   Updates provided. Although patient is somewhat more awake and alert she continues to have poor nutrition and given co-morbidities concerns for meaningful recovery has been discussed.   Sons are clear they would not wish for patient to suffer and to focus on her quality of life. If no significant improvement their focus would be on her comfort during what time she has left with minimizing medical interventions. No life-sustaining measures.  Encouraged ongoing family conversation and Ms. McLean's medical providers regarding overall plan of care and treatment options, ensuring decisions are within the context of the patients values and GOCs.   Assessment: -awake, alert to self only, NAD, ill-appearing -RRR -Normal breathing pattern -Will follow some simple commands   Plan -DNR, no artificial feedings -Continue with current plan of care, watchful waiting, son from [facility] is visiting this weekend.  -Encourage ongoing family  discussions regarding GOC focusing on patient's quality of life.  -PMT will continue to support and follow as needed.   Time Total: 25 min   Visit consisted of counseling and education dealing with the complex and emotionally intense issues of symptom management and palliative care in the setting of serious and potentially life-threatening illness.Greater than 50%  of this time was spent counseling and coordinating care related to the above assessment and plan.  Willette Alma, AGPCNP-BC  Palliative Medicine Team [phone]

## 2020-09-11 DIAGNOSIS — E039 Hypothyroidism, unspecified: Secondary | ICD-10-CM | POA: Diagnosis not present

## 2020-09-11 DIAGNOSIS — I10 Essential (primary) hypertension: Secondary | ICD-10-CM | POA: Diagnosis not present

## 2020-09-11 DIAGNOSIS — N390 Urinary tract infection, site not specified: Secondary | ICD-10-CM | POA: Diagnosis not present

## 2020-09-11 DIAGNOSIS — N179 Acute kidney failure, unspecified: Secondary | ICD-10-CM | POA: Diagnosis not present

## 2020-09-11 LAB — CBC WITH DIFFERENTIAL/PLATELET
Abs Immature Granulocytes: 0.05 10*3/uL (ref 0.00–0.07)
Basophils Absolute: 0 10*3/uL (ref 0.0–0.1)
Basophils Relative: 0 %
Eosinophils Absolute: 0.1 10*3/uL (ref 0.0–0.5)
Eosinophils Relative: 1 %
HCT: 37.8 % (ref 36.0–46.0)
Hemoglobin: 12 g/dL (ref 12.0–15.0)
Immature Granulocytes: 1 %
Lymphocytes Relative: 16 %
Lymphs Abs: 1.6 10*3/uL (ref 0.7–4.0)
MCH: 30.4 pg (ref 26.0–34.0)
MCHC: 31.7 g/dL (ref 30.0–36.0)
MCV: 95.7 fL (ref 80.0–100.0)
Monocytes Absolute: 1.4 10*3/uL — ABNORMAL HIGH (ref 0.1–1.0)
Monocytes Relative: 14 %
Neutro Abs: 6.6 10*3/uL (ref 1.7–7.7)
Neutrophils Relative %: 68 %
Platelets: 140 10*3/uL — ABNORMAL LOW (ref 150–400)
RBC: 3.95 MIL/uL (ref 3.87–5.11)
RDW: 14.5 % (ref 11.5–15.5)
WBC: 9.8 10*3/uL (ref 4.0–10.5)
nRBC: 0 % (ref 0.0–0.2)

## 2020-09-11 LAB — COMPREHENSIVE METABOLIC PANEL
ALT: 10 U/L (ref 0–44)
AST: 16 U/L (ref 15–41)
Albumin: 2.8 g/dL — ABNORMAL LOW (ref 3.5–5.0)
Alkaline Phosphatase: 57 U/L (ref 38–126)
Anion gap: 10 (ref 5–15)
BUN: 13 mg/dL (ref 8–23)
CO2: 24 mmol/L (ref 22–32)
Calcium: 8.9 mg/dL (ref 8.9–10.3)
Chloride: 105 mmol/L (ref 98–111)
Creatinine, Ser: 0.81 mg/dL (ref 0.44–1.00)
GFR, Estimated: >60 mL/min (ref >60)
Glucose, Bld: 192 mg/dL — ABNORMAL HIGH (ref 70–99)
Potassium: 4.3 mmol/L (ref 3.5–5.1)
Sodium: 139 mmol/L (ref 135–145)
Total Bilirubin: 0.8 mg/dL (ref 0.3–1.2)
Total Protein: 6.2 g/dL — ABNORMAL LOW (ref 6.5–8.1)

## 2020-09-11 LAB — MAGNESIUM: Magnesium: 1.8 mg/dL (ref 1.7–2.4)

## 2020-09-11 LAB — PHOSPHORUS: Phosphorus: 2.7 mg/dL (ref 2.5–4.6)

## 2020-09-11 MED ORDER — KCL IN DEXTROSE-NACL 20-5-0.9 MEQ/L-%-% IV SOLN
INTRAVENOUS | Status: DC
  Filled 2020-09-11 (×5): qty 1000

## 2020-09-11 MED ORDER — MAGNESIUM SULFATE 2 GM/50ML IV SOLN
2.0000 g | Freq: Once | INTRAVENOUS | Status: AC
  Administered 2020-09-11: 2 g via INTRAVENOUS
  Filled 2020-09-11: qty 50

## 2020-09-11 NOTE — Progress Notes (Signed)
Bladder scan done showing only 136 ml in bladder. Pt has voided using purewick external catheter this shift, and was also incontinent unmeasurable x2. Pt has not eaten anything this shift- has refused when alert, and is not alert enough at this time. Her son was able to give her a few drops of liquid this AM, but pt told him she did not want anymore. Unable to complete any calorie count this shift d/t no intake of calories. Pt has been turned throughout this shift, and mouth care has been provided.

## 2020-09-11 NOTE — Progress Notes (Signed)
PROGRESS NOTE    Lori Ellis  IWP:[id number] DOB: [birth year] DOA: 09/04/2020 PCP: Truett Perna, MD   Brief Narrative:  The patient is a 87 year old female with past medical history significant for but not limited to essential hypertension, type 2 diabetes mellitus, peripheral neuropathy, history of spinal stenosis and chronic pain, hypothyroidism, vitamin D deficiency as well as other comorbidities who presented to the emergency room on 09/04/2020 with worsening confusion.  She currently lives in assisted living facility is recently hospitalized for syncopal episode and found to have urinary tract infection at that time of discharged on cefdinir.  She is brought back into the hospital by her son due to worsening mentation.  UA was noted to be abnormal.  She was continued on ceftriaxone.  MRI brain was obtained given her encephalopathy and did not show any acute infarct but did show atrophy and chronic small vessel disease and chronic infarcts noted.  EEG was done and was suggestive of epileptiform activity in the right temporal lobe.  Patient was less responsive yesterday and has further declined today and palliative recommending transitioning to focus to a more comfort approach.  Neurology to evaluate prior to the family make any final decisions about transition to comfort care.  Neurology changed the patient's antiepileptic from Depakote to Keppra and have ordered a repeat EEG they are recommending delirium precautions and avoiding Haldol and checking a thiamine level and repeating thiamine after level has been checked.  Dr. Wilford Corner also recommended changing antibiotics to a non-cephalosporin; overnight she did spike a temperature of 101.3 and blood cultures have been ordered however she is more somnolent and even less responsive today compared to yesterday.  Neurology will follow up on the EEG and if all the toxic metabolic derangements are optimized and she continues not improve then neurology recommends  asked her to consider comfort measures only however she is improving now and she is more awake and alert today than she was yesterday and she has been laughing and cracking jokes with her sons.  She is still altered but more awake and alert and will repeat her SLP.  Repeat EEG showed bitemporal slowing suggestive of bitemporal focal neuronal dysfunction but was not consistent with ongoing seizures.  Neurology recommended continuing low-dose Keppra 250 mg p.o. twice daily  Repeat speech evaluation showed that she was cleared for dysphagia 1 diet with thin liquids.  She is still awake today but not as alert as yesterday.  Her appetite has not been very good today and so we have started a calorie count.  She has made her wishes clear and she does not want artificial feeding.  If patient is unable to maintain her oral nutrition and caloric intake we will need to discuss with the family about transitioning goals of care to comfort.  Assessment & Plan:   Active Problems:   Hypothyroidism   Type II diabetes mellitus with renal manifestations (HCC)   Benign essential HTN   Acute lower UTI   Metabolic encephalopathy   AKI (acute kidney injury) (HCC)   Acute metabolic encephalopathy  Acute Metabolic Ecephalopathy/recent vasovagal syncope, improving slowly now and she was more awake yesterday but slightly more somnolent today -Likely multifactorial including incompletely treated UTI, dehydration and acute kidney injury.   -Patient did not exhibit any focal deficits.   -CT head did not show any focal findings.   -Patient was hospitalized a few days ago for syncope thought to be due to vasovagal.   -Further work-up was pursued.  MRI brain was done yesterday which shows atrophy and findings suggestive of remote infarcts.  No acute infarct identified. -Folic acid B12 level was normal.   -TSH was normal when checked recently. -Continue to treat UTI  -EEG done on 09/06/2020 did raise concern for epileptiform  activity in the right temporal area; patient was initiated on loaded with IV Depakote and started on p.o. Depakote however her mentation has significantly worsened and she is more somnolent and difficult to arouse so the Depakote was changed back to IV today and now has been stopped by neurology and changed to Keppra and they are recommending continue Keppra to 50 mg p.o. twice daily -Neurology formally consulted for further evaluation recommendations repeat EEG was reviewed by the neurologist Dr. Otelia Limes and EEG was not consistent with ongoing seizures but did reveal bitemporal slowing and suggestive of bitemporal focal neuronal dysfunction -Palliative care is also following and if neurology no further recommendations and the recommendation would be to transition the patient to comfort care; neurology discharge optimize all her toxic metabolic derangement and changing and reevaluate her EEG however I feel that she is worsening and continues to decline -Neurology agrees with holding her home Keppra and pain medications and avoiding Haldol as well and recommending delirium precautions and check thiamine level and repeating thiamine after checking level -We will give her high-dose IV thiamine after her B1 level is drawn and start 500 mg IV q8h scheduled for 3 days and then taper -Patient was much more awake and alert today but still confused a little bit so will obtain SLP evaluation for swallowing; SLP V done and she is cleared for dysphagia 1 diet.  Family thinks she is doing little bit better yesterday however today they think she is a little worse even though she is still awake and responsive she is more somnolent -I am clinically concerned about her oral intake so I ordered a calorie count.  Per her wishes she did not want any artificial feeding and no PEG tube and will not even attempt to place a Cortrak feeding tube; Follow up on Calorie Count after 48 Hours   Concern for Epilepsy, ruled oud -Patient  underwent EEG 3/21 which does raise concern for epileptiform activity in the right temporal area.   -Discussed with Dr. Otelia Limes with neurology who recommends loading with Depakote and then starting 3 times a day dosage with 375 mcg.    Now Depakote has been stopped and changed to IV Keppra without load at 250 mg twice daily -Ammonia level normal.  LFTs normal.   -Check Depakote level during hospitalization and was 74 -Neurology was formally consulted given family request because she has not improved with the Depakote that was initiated and this has been changed to IV Keppra and EEG is being repeated as above -She was placed on a full liquid diet but unfortunately had not been able to eat given her encephalopathy and will need SLP re-evaluation and she is cleared for dysphagia 1 diet  Recently Diagnosed UTI -Urine cultures growing E. coli.   -Noted to be pansensitive.   -Continue IV ceftriaxone for now.   -Follow-up on blood cultures.  Plan for 5-day course. -We will respect her temperature overnight of 101.3 so we will repeat blood cultures x2 now -WBC trended up to 11.2 and has now Normalized at 9.8  Acute kidney injury on chronic kidney disease stage IIIb/hypokalemia Metabolic Acidosis  -Likely dehydrated from poor oral intake.   -Baseline creatinine around 1.  Lisinopril  is on hold.   -Patient's creatinine has improved with IV hydration.   -Continued gentle IV fluids and fluids had been resumed to D5 normal saline at 75 mL's per hour +20 mEq of KCl but will change to Sodium Bicarbonate gtt in D5W at 75 mL/hr given Acidosis  -Patient has a mild metabolic acidosis and worsened slgihtly as CO2 is 18, anion gap of 11, chloride level of 110; NOW Acidosis is improved as CO2 is 24, chloride level is 105, anion gap is 10 -Patient's oral intake remained poor and she is more awake today so will get SLP re-evaluation -Continue to monitor and trend and avoid nephrotoxic medications, contrast dyes,  hypotension and renally dose medications -Repeat CMP in a.m.  Essential Hypertension -Unable to take oral meds due to her alteration in mental status.   -Use hydralazine as needed.   -Blood pressure is reasonably well controlled.  Occasional high readings noted. -Continue to monitor blood pressures per protocol and last blood pressure reading was 151/63  Hypothyroidism -TSH was 2.8 on 3/17 -Changed her p.o. levothyroxine to IV yesterday  Diabetes Mellitus Type 2, non-insulin-dependent -HbA1c 6.6 when checked recently.   -Check glucose levels once a day.  No SSI for Now. -CBG's ranging from 126-192  Chronic low back pain/lumbar spinal stenosis -Tramadol is being held currently .  PT and OT but she is not awake enough to even participate today. -Usually gets around in a wheelchair at baseline.  History of peripheral neuropathy -On gabapentin at home which is currently on hold due to her encephalopathy and her inability to swallow  Leukocytosis -Mild -Patient's WBC went from 11.1 -> 10.4 -> 10.1 -> 11.2 -> 10.9 -> 10.3 and is now 9.8 -Continue to monitor for signs and symptoms of infection she is on IV ceftriaxone for now -Repeat CBC in a.m.   Hypokalemia -Paitent's K+ was 3.2 and is now 4.3 -Continue to Monitor and Replete as Necessary -Repeat CMP in the AM   Hypophosphatemia -Patient's Phos Level was 2.0 and is now 2.7 -Continue to Monitor and Replete as Necessary -Repeat Phos Level in the AM   Thrombocytopenia -Patient's platelet count has steadily dropped and went from 231 -> 171 -> 157 -> 149 -> 151 -> 138 -> 140 -Unclear etiology  -Continue to monitor for signs and symptoms of bleeding; currently no overt bleeding noted -Repeat CBC in a.m.  Goals of Care -Patient is DNR -Prognosis is guarded at this time but she is slowly improving and much more awake today than the last few days.   -Patient has not improved the initiation of Depakote but now is improving on  Keppra.   -She has not shown much improvement in the last 4 days otherwise.   -After discussion with patient's son yesterday palliative Care has been consulted and family requesting continuing current plan of care with pending neurology input.  If no further treatment options are available they would focus on the patient's comfort and symptom management allow her to be at peace with what time she is left; Continue current care and Neuro feels she is not having seizures but recommending Keppra 250 mg BID; She is improved and awoke today but if she declines and does not improve she would be Hospice appropriate given that she was worse off today then yesterday  -Likely the patient would NOT be able to return to her assisted living at [place]; Will need PT/OT to re-evaluate   DVT prophylaxis: Enoxaparin 30 mg sq q24h Code Status:  DO NOT RESUSCITATE Family Communication: Spoke to her Son at bedside  Disposition Plan: Pending further clinical improvement; patient has a very high risk for decompensation given her worsening status and palliative care is involved in goals of care discussion.  Neurology to weigh in and if they have no further recommendations patient is appropriate to be transition to hospice care given her advanced age, frail status and comorbidities given her lack of improvement  Status is: Inpatient  Remains inpatient appropriate because:Unsafe d/c plan, IV treatments appropriate due to intensity of illness or inability to take PO and Inpatient level of care appropriate due to severity of illness   Dispo:  Patient From: Assisted Living Facility  Planned Disposition: Skilled Nursing Facility  Medically stable for discharge: No     Consultants:   Palliative Care    Procedures:  EEG  This is an abnormal waking and drowsy study due to 1) attenuated organization as described above suggestive of a mild diffuse encephalopathy, with 2) superimposed focal neuronal dysfunction in the  right greater than left bitemporal region, and 3) occasional, isolated right temporal discharges suggestive of an epileptiform foci in this region.  There was no evidence of seizure activity however.  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    Antimicrobials:  Anti-infectives (From admission, onward)   Start     Dose/Rate Route Frequency Ordered Stop   09/05/20 2100  cefTRIAXone (ROCEPHIN) 1 g in sodium chloride 0.9 % 100 mL IVPB        1 g 200 mL/hr over 30 Minutes Intravenous Every 24 hours 09/04/20 2334 09/10/20 2208   09/04/20 2045  cefTRIAXone (ROCEPHIN) 1 g in sodium chloride 0.9 % 100 mL IVPB        1 g 200 mL/hr over 30 Minutes Intravenous  Once 09/04/20 2031 09/05/20 0027        Subjective: Seen and examined at bedside and she was a little drowsy but easily awoken today.  She is not as alert as she was yesterday and family has noticed that she has been sleeping more.  She did not want to eat as much today either.  When I asked if she was in pain she denied any pain but told her son earlier that her "teeth hurt".  She was initially alert to my questioning but then fell asleep midway through the encounter.  Son feels like she is doing worse today than she did yesterday.  Still concerned about her caloric intake so we have ordered a calorie count.   We will continue to monitor carefully and all questions were answered to the patient's son satisfaction.  Objective: Vitals:   09/10/20 2352 09/11/20 0000 09/11/20 0528 09/11/20 1310  BP: 118/71  (!) 161/70 (!) 151/63  Pulse: (!) 120 (!) 102 93 88  Resp: (!) 32 Temp: 98.1 F (36.7 C)  (!) 97.3 F (36.3 C) 98.4 F (36.9 C)  TempSrc:   Axillary Oral  SpO2:  92%  95% 97%  Weight:      Height:        Intake/Output Summary (Last 24 hours) at 09/11/2020 1644 Last data filed at 09/11/2020 1500 Gross per 24 hour  Intake 750.29 ml  Output 1200 ml  Net -449.71 ml   Filed Weights   09/04/20 1806 09/05/20 0124  Weight: 58.5 kg 57.1 kg   Examination: Physical Exam:  Constitutional: The patient is a thin chronically ill-appearing Caucasian female who is more somnolent today but arouses and interacts; Not as alert or awake as yesterday though but does respond to physical and verbal stimuli  Eyes: Lids and conjunctivae normal, sclerae anicteric  ENMT: External Ears, Nose appear normal. Grossly normal hearing.  Neck: Appears normal, supple, no cervical masses, normal ROM, no appreciable thyromegaly; no JVD Respiratory: Diminished to auscultation bilaterally, no wheezing, rales, rhonchi or crackles. Normal respiratory effort and patient is not tachypenic. No accessory muscle use.  Cardiovascular: RRR, no murmurs / rubs / gallops. S1 and S2 auscultated. No extremity edema. Abdomen: Soft, non-tender, non-distended. Bowel sounds positive.  GU: Deferred. Musculoskeletal: No clubbing / cyanosis of digits/nails. No joint deformity upper and lower extremities.  Skin: No rashes, lesions, ulcers on a limited skin evaluation but still has a scratch in her left arm. No induration; Warm and dry.  Neurologic: CN 2-12 grossly intact with no focal deficits. Romberg sign and cerebellar reflexes not assessed.  Psychiatric: Impaired judgment and insight. She is somnolent but arousable and but not as  Alert as yesterday. Not oriented x 3. Normal mood and appropriate affect.   Data Reviewed: I have personally reviewed following labs and imaging studies  CBC: Recent Labs  Lab 09/04/20 1851 09/05/20 0555 09/07/20 0816 09/08/20 0516 09/09/20 0505 09/10/20 0750 09/11/20 0558  WBC 12.1*   < > 10.1 11.2* 10.9* 10.3 9.8  NEUTROABS 7.1  --   --   --  7.4 7.6 6.6  HGB 13.9   < > 12.4 12.1 11.9* 12.6 12.0  HCT 43.8   < > 39.2 38.8 37.9 40.4 37.8  MCV 94.6   < > 94.7 96.3 95.0 96.9 95.7  PLT 273   < > 157 149* 151 138* 140*   < > = values in this interval not displayed.   Basic Metabolic Panel: Recent Labs  Lab 09/07/20 0604 09/08/20 0516 09/09/20 0505 09/10/20 0750 09/11/20 0558  NA 142 141 141 139 139  K 3.3* 3.9 3.6 3.2* 4.3  CL 111 110 111 110 105  CO2 20* 20* 21* 18* 24  GLUCOSE 153* 126* 178* 186* 192*  BUN 29* 27* 22 15 13   CREATININE 1.09* 0.87 0.92 0.63 0.81  CALCIUM 9.4 9.1 8.8* 8.9 8.9  MG  --  2.1 2.0 1.9 1.8  PHOS  --   --  2.5 2.0* 2.7   GFR: Estimated Creatinine Clearance: 32.8 mL/min (by C-G formula based on SCr of 0.81 mg/dL). Liver Function Tests: Recent Labs  Lab 09/07/20 0604 09/08/20 0516 09/09/20 0505 09/10/20 0750 09/11/20 0558  AST 21 17 13* 13* 16  ALT 12 12 10 10 10   ALKPHOS 62 65 59 61 57  BILITOT 0.8 0.8 0.5 0.6 0.8  PROT 6.9 6.8 6.4* 6.7 6.2*  ALBUMIN 3.6 3.4* 3.0* 3.0* 2.8*   No results for input(s): LIPASE, AMYLASE in the last 168 hours. Recent Labs  Lab 09/07/20 0925  AMMONIA 12   Coagulation Profile: No results for input(s): INR, PROTIME in the last 168 hours. Cardiac  Enzymes: No results for input(s): CKTOTAL, CKMB, CKMBINDEX, TROPONINI in the last 168 hours. BNP (last 3 results) No results for input(s): PROBNP in the last 8760 hours. HbA1C: No results for input(s): HGBA1C in the last 72 hours. CBG: Recent Labs  Lab 09/04/20 1835  GLUCAP 137*   Lipid Profile: No results for input(s): CHOL, HDL, LDLCALC, TRIG,  CHOLHDL, LDLDIRECT in the last 72 hours. Thyroid Function Tests: No results for input(s): TSH, T4TOTAL, FREET4, T3FREE, THYROIDAB in the last 72 hours. Anemia Panel: No results for input(s): VITAMINB12, FOLATE, FERRITIN, TIBC, IRON, RETICCTPCT in the last 72 hours. Sepsis Labs: Recent Labs  Lab 09/04/20 2030  LATICACIDVEN 1.3    Recent Results (from the past 240 hour(s))  SARS CORONAVIRUS 2 (TAT 6-24 HRS) Nasopharyngeal Nasopharyngeal Swab     Status: None   Collection Time: 09/02/20 12:00 PM   Specimen: Nasopharyngeal Swab  Result Value Ref Range Status   SARS Coronavirus 2 NEGATIVE NEGATIVE Final    Comment: (NOTE) SARS-CoV-2 target nucleic acids are NOT DETECTED.  The SARS-CoV-2 RNA is generally detectable in upper and lower respiratory specimens during the acute phase of infection. Negative results do not preclude SARS-CoV-2 infection, do not rule out co-infections with other pathogens, and should not be used as the sole basis for treatment or other patient management decisions. Negative results must be combined with clinical observations, patient history, and epidemiological information. The expected result is Negative.  Fact Sheet for Patients: HairSlick.nohttps://www.fda.gov/media/138098/download  Fact Sheet for Healthcare Providers: quierodirigir.comhttps://www.fda.gov/media/138095/download  This test is not yet approved or cleared by the [place] FDA and  has been authorized for detection and/or diagnosis of SARS-CoV-2 by FDA under an Emergency Use Authorization (EUA). This EUA will remain  in effect (meaning this test can be used) for the duration of the COVID-19 declaration under Se ction 564(b)(1) of the Act, 21 U.S.C. section 360bbb-3(b)(1), unless the authorization is terminated or revoked sooner.  Performed at [facility], [address] [place], [place] [address]   Culture, Urine     Status: Abnormal   Collection Time: 09/02/20  7:15 PM   Specimen: Urine, Random   Result Value Ref Range Status   Specimen Description   Final    URINE, RANDOM Performed at [facility], [address] [place], [place] [address]    Special Requests   Final    NONE Performed at [facility], [address] [place], [place] [address]    Culture >=100,000 COLONIES/mL ESCHERICHIA COLI (A)  Final   Report Status 09/05/2020 FINAL  Final   Organism ID, Bacteria ESCHERICHIA COLI (A)  Final      Susceptibility   Escherichia coli - MIC*    AMPICILLIN 8 SENSITIVE Sensitive     CEFAZOLIN <=4 SENSITIVE Sensitive     CEFEPIME <=0.12 SENSITIVE Sensitive     CEFTRIAXONE <=0.25 SENSITIVE Sensitive     CIPROFLOXACIN <=0.25 SENSITIVE Sensitive     GENTAMICIN <=1 SENSITIVE Sensitive     IMIPENEM <=0.25 SENSITIVE Sensitive     NITROFURANTOIN <=16 SENSITIVE Sensitive     TRIMETH/SULFA <=20 SENSITIVE Sensitive     AMPICILLIN/SULBACTAM 4 SENSITIVE Sensitive     PIP/TAZO <=4 SENSITIVE Sensitive     * >=100,000 COLONIES/mL ESCHERICHIA COLI  Urine Culture     Status: Abnormal   Collection Time: 09/04/20  6:51 PM   Specimen: Urine, Random  Result Value Ref Range Status   Specimen Description   Final  URINE, RANDOM Performed at [facility], [address] [place], [place] [address]    Special Requests   Final    NONE Performed at [facility], [address] [place], [place] [address]    Culture >=100,000 COLONIES/mL ESCHERICHIA COLI (A)  Final   Report Status 09/07/2020 FINAL  Final   Organism ID, Bacteria ESCHERICHIA COLI (A)  Final      Susceptibility   Escherichia coli - MIC*    AMPICILLIN 8 SENSITIVE Sensitive     CEFAZOLIN <=4 SENSITIVE Sensitive     CEFEPIME <=0.12 SENSITIVE Sensitive     CEFTRIAXONE <=0.25 SENSITIVE Sensitive     CIPROFLOXACIN <=0.25 SENSITIVE Sensitive     GENTAMICIN <=1 SENSITIVE Sensitive     IMIPENEM <=0.25 SENSITIVE Sensitive     NITROFURANTOIN <=16 SENSITIVE  Sensitive     TRIMETH/SULFA <=20 SENSITIVE Sensitive     AMPICILLIN/SULBACTAM 4 SENSITIVE Sensitive     PIP/TAZO <=4 SENSITIVE Sensitive     * >=100,000 COLONIES/mL ESCHERICHIA COLI  Resp Panel by RT-PCR (Flu A&B, Covid) Nasopharyngeal Swab     Status: None   Collection Time: 09/04/20  9:25 PM   Specimen: Nasopharyngeal Swab; Nasopharyngeal(NP) swabs in vial transport medium  Result Value Ref Range Status   SARS Coronavirus 2 by RT PCR NEGATIVE NEGATIVE Final    Comment: (NOTE) SARS-CoV-2 target nucleic acids are NOT DETECTED.  The SARS-CoV-2 RNA is generally detectable in upper respiratory specimens during the acute phase of infection. The lowest concentration of SARS-CoV-2 viral copies this assay can detect is 138 copies/mL. A negative result does not preclude SARS-Cov-2 infection and should not be used as the sole basis for treatment or other patient management decisions. A negative result may occur with  improper specimen collection/handling, submission of specimen other than nasopharyngeal swab, presence of viral mutation(s) within the areas targeted by this assay, and inadequate number of viral copies(<138 copies/mL). A negative result must be combined with clinical observations, patient history, and epidemiological information. The expected result is Negative.  Fact Sheet for Patients:  BloggerCourse.com  Fact Sheet for Healthcare Providers:  SeriousBroker.it  This test is no t yet approved or cleared by the [place] FDA and  has been authorized for detection and/or diagnosis of SARS-CoV-2 by FDA under an Emergency Use Authorization (EUA). This EUA will remain  in effect (meaning this test can be used) for the duration of the COVID-19 declaration under Section 564(b)(1) of the Act, 21 U.S.C.section 360bbb-3(b)(1), unless the authorization is terminated  or revoked sooner.       Influenza A by PCR NEGATIVE NEGATIVE  Final   Influenza B by PCR NEGATIVE NEGATIVE Final    Comment: (NOTE) The Xpert Xpress SARS-CoV-2/FLU/RSV plus assay is intended as an aid in the diagnosis of influenza from Nasopharyngeal swab specimens and should not be used as a sole basis for treatment. Nasal washings and aspirates are unacceptable for Xpert Xpress SARS-CoV-2/FLU/RSV testing.  Fact Sheet for Patients: BloggerCourse.com  Fact Sheet for Healthcare Providers: SeriousBroker.it  This test is not yet approved or cleared by the [place] FDA and has been authorized for detection and/or diagnosis of SARS-CoV-2 by FDA under an Emergency Use Authorization (EUA). This EUA will remain in effect (meaning this test can be used) for the duration of the COVID-19 declaration under Section 564(b)(1) of the Act, 21 U.S.C. section [id number])(1), unless the authorization is terminated or revoked.  Performed at [facility], [address]  [place], [place] [address]   Blood culture (routine x 2)     Status: None   Collection Time: 09/04/20  9:40 PM   Specimen: BLOOD  Result Value Ref Range Status   Specimen Description   Final    BLOOD SITE NOT SPECIFIED Performed at [facility], [address], [place], [place] [address]    Special Requests   Final    BOTTLES DRAWN AEROBIC AND ANAEROBIC Blood Culture adequate volume Performed at [facility], [address] [place], [place] [address]    Culture   Final    NO GROWTH 5 DAYS Performed at [facility], [address] [place], [place] [address]    Report Status 09/10/2020 FINAL  Final  Culture, blood (routine x 2)     Status: None (Preliminary result)   Collection Time: 09/09/20 10:08 AM   Specimen: BLOOD  Result Value Ref Range Status   Specimen Description   Final    BLOOD BLOOD LEFT FOREARM Performed at [facility], [address],  [place], [place] [address]    Special Requests   Final    BOTTLES DRAWN AEROBIC AND ANAEROBIC Blood Culture adequate volume Performed at [facility], [address], [place], [place] [address]    Culture   Final    NO GROWTH 2 DAYS Performed at [facility], [address], [place], [place] [address]    Report Status PENDING  Incomplete  Culture, blood (routine x 2)     Status: None (Preliminary result)   Collection Time: 09/09/20 10:10 AM   Specimen: BLOOD  Result Value Ref Range Status   Specimen Description   Final    BLOOD LEFT WRIST Performed at [facility], [address] [place], [place] [address]    Special Requests   Final    BOTTLES DRAWN AEROBIC AND ANAEROBIC Blood Culture adequate volume Performed at [facility], [address], [place], [place] [address]    Culture   Final    NO GROWTH 2 DAYS Performed at [facility], [address], [place], [place] [address]    Report Status PENDING  Incomplete    RN Pressure Injury Documentation:    Estimated body mass index is 22.3 kg/m as calculated from the following:   Height as of this encounter:  (1.6 m).   Weight as of this encounter: 57.1 kg.  Malnutrition Type:  Nutrition Problem: Inadequate oral intake Etiology: acute illness (infection, poor appetite and limited fluid acceptance)  Malnutrition Characteristics:  Signs/Symptoms: per patient/family report (son reports poor intake x 3 days, per MD- UTI, dehydration, AKI on admission)  Nutrition Interventions:  Interventions: Ensure Enlive (each supplement provides 350kcal and 20 grams of protein),Magic cup,Liberalize Diet   Radiology Studies: No results found. Scheduled Meds: . enoxaparin (LOVENOX) injection  30 mg Subcutaneous Q24H  . feeding supplement  237 mL Oral BID BM  . [START ON 09/12/2020] levothyroxine  37.5 mcg Intravenous Daily  . polyvinyl alcohol  2 drop Both Eyes TID    Continuous Infusions: . dextrose 5 % and 0.9 % NaCl with KCl 20 mEq/L 75 mL/hr at 09/11/20 1106  . levETIRAcetam 250 mg (09/11/20 1107)  . thiamine injection 500 mg (09/11/20 0910)    LOS: 6 days   Merlene Laughter, DO [facility] PAGER is on AMION  If 7PM-7AM, please contact night-coverage [email or web address]

## 2020-09-11 NOTE — Evaluation (Signed)
Occupational Therapy Evaluation Patient Details Name: Lori Ellis MRN: [id number] DOB: 08-Dec-1920 Today's Date: 09/11/2020    History of Present Illness Lori Ellis is a 87 y.o. female with medical history significant of essential hypertension, type 2 diabetes mellitus, peripheral neuropathy, spinal stenosis, chronic pain, hypothyroidism, vitamin D deficiency who presents from her skilled nursing facility after being found on the toilet unresponsive. Palliative medicine following for GOC. Patient has aroused now.   Clinical Impression   Lori Ellis is a 87 year old woman who presents with lethargy, safety mitts on hands, eyes closed with limited verbalization and participation. Patient predominantly keeps eyes closed but does answer a couple of questions. She is total assist for bed mobility and total assist for care. She is unable to feed herself and or perform grooming - therapist removed mitts and attempted to have patient drink and wash face. Patient does not attempt task. Patient does not attempt to suck on straw. She is able to swallow when small amount of water placed in her mouth. Is resistant to the tiny bit of ice cream. Patient states "That's enough" with partial transfer into sitting and with moving her upper extremities. Currently patient does demonstrate the ability to actively participate in therapy nor the motivation. Patient's son in room reports patient is not eating and lack of nutrition is a concern. Therapist recommends palliative services and this recommendation was relayed to son. Patient needs 24/7 assistance at discharge. If patient becomes more alert and participatory please re-order OT.     Follow Up Recommendations   (LTC vs palliative)    Equipment Recommendations  None recommended by OT    Recommendations for Other Services       Precautions / Restrictions Precautions Precautions: Fall Precaution Comments: was not combative but resistive verbally and   bodily to attempts to sit up. Restrictions Weight Bearing Restrictions: No      Mobility Bed Mobility               General bed mobility comments: Partial transfer to side of bed but patient became resistant and states "that's enough". Currently total care for bedm mobility.    Transfers                      Balance                                           ADL either performed or assessed with clinical judgement   ADL                                         General ADL Comments: Total care at this time for feeding and grooming due to lack of participation.     Vision   Additional Comments: unable to assess     Perception     Praxis      Pertinent Vitals/Pain Pain Assessment: Faces Faces Pain Scale: Hurts a little bit Pain Location: groans and states "everywhere" Pain Descriptors / Indicators: Grimacing;Guarding Pain Intervention(s): Monitored during session     Hand Dominance Right   Extremity/Trunk Assessment Upper Extremity Assessment Upper Extremity Assessment:  (Grossly able to passively range UEs, Right shoulder limited and tigher than left. Patient is guarding and somewhat resistive.)  Communication Communication Communication: HOH   Cognition Arousal/Alertness: Lethargic Behavior During Therapy: Restless                                   General Comments: Patient's level of arousal decreased today compared to yesterday. Can state a couple of words to questions but predominantly keeps eyes closed and doesn't participate. States "that's enough" when attempting to move her.   General Comments       Exercises     Shoulder Instructions      Home Living Family/patient expects to be discharged to:: Skilled nursing facility                                 Additional Comments: [place] from ALF, WC dependent      Prior Functioning/Environment           Comments: per chart review patient able to transfer self to wc and assist with ADLs. s        OT Problem List: Impaired balance (sitting and/or standing);Decreased activity tolerance;Decreased safety awareness;Decreased cognition;Pain      OT Treatment/Interventions:      OT Goals(Current goals can be found in the care plan section) Acute Rehab OT Goals OT Goal Formulation: All assessment and education complete, DC therapy  OT Frequency:     Barriers to D/C:            Co-evaluation              AM-PAC OT "6 Clicks" Daily Activity     Outcome Measure Help from another person eating meals?: Total Help from another person taking care of personal grooming?: Total Help from another person toileting, which includes using toliet, bedpan, or urinal?: Total Help from another person bathing (including washing, rinsing, drying)?: Total Help from another person to put on and taking off regular upper body clothing?: Total Help from another person to put on and taking off regular lower body clothing?: Total 6 Click Score: 6   End of Session Nurse Communication: Mobility status  Activity Tolerance: Patient limited by lethargy Patient left: in bed;with call bell/phone within reach;with bed alarm set;with family/visitor present  OT Visit Diagnosis: Other symptoms and signs involving cognitive function;Adult, failure to thrive (R62.7)                Time: 5397-6734 OT Time Calculation (min): 21 min Charges:  OT General Charges $OT Visit: 1 Visit OT Evaluation $OT Eval Low Complexity: 1 Low  Tomiko Schoon, OTR/L Acute Care Rehab Services  Office [phone] Pager: [phone]   Kelli Churn 09/11/2020, 12:50 PM

## 2020-09-12 DIAGNOSIS — N179 Acute kidney failure, unspecified: Secondary | ICD-10-CM | POA: Diagnosis not present

## 2020-09-12 DIAGNOSIS — E039 Hypothyroidism, unspecified: Secondary | ICD-10-CM | POA: Diagnosis not present

## 2020-09-12 DIAGNOSIS — I10 Essential (primary) hypertension: Secondary | ICD-10-CM | POA: Diagnosis not present

## 2020-09-12 DIAGNOSIS — N390 Urinary tract infection, site not specified: Secondary | ICD-10-CM | POA: Diagnosis not present

## 2020-09-12 LAB — CBC WITH DIFFERENTIAL/PLATELET
Abs Immature Granulocytes: 0.03 10*3/uL (ref 0.00–0.07)
Basophils Absolute: 0 10*3/uL (ref 0.0–0.1)
Basophils Relative: 0 %
Eosinophils Absolute: 0.2 10*3/uL (ref 0.0–0.5)
Eosinophils Relative: 3 %
HCT: 34.7 % — ABNORMAL LOW (ref 36.0–46.0)
Hemoglobin: 10.8 g/dL — ABNORMAL LOW (ref 12.0–15.0)
Immature Granulocytes: 0 %
Lymphocytes Relative: 18 %
Lymphs Abs: 1.4 10*3/uL (ref 0.7–4.0)
MCH: 30 pg (ref 26.0–34.0)
MCHC: 31.1 g/dL (ref 30.0–36.0)
MCV: 96.4 fL (ref 80.0–100.0)
Monocytes Absolute: 1.1 10*3/uL — ABNORMAL HIGH (ref 0.1–1.0)
Monocytes Relative: 14 %
Neutro Abs: 5.2 10*3/uL (ref 1.7–7.7)
Neutrophils Relative %: 65 %
Platelets: 174 10*3/uL (ref 150–400)
RBC: 3.6 MIL/uL — ABNORMAL LOW (ref 3.87–5.11)
RDW: 14.3 % (ref 11.5–15.5)
WBC: 7.8 10*3/uL (ref 4.0–10.5)
nRBC: 0 % (ref 0.0–0.2)

## 2020-09-12 LAB — COMPREHENSIVE METABOLIC PANEL
ALT: 9 U/L (ref 0–44)
AST: 12 U/L — ABNORMAL LOW (ref 15–41)
Albumin: 2.6 g/dL — ABNORMAL LOW (ref 3.5–5.0)
Alkaline Phosphatase: 55 U/L (ref 38–126)
Anion gap: 8 (ref 5–15)
BUN: 13 mg/dL (ref 8–23)
CO2: 23 mmol/L (ref 22–32)
Calcium: 8.5 mg/dL — ABNORMAL LOW (ref 8.9–10.3)
Chloride: 108 mmol/L (ref 98–111)
Creatinine, Ser: 0.79 mg/dL (ref 0.44–1.00)
GFR, Estimated: >60 mL/min (ref >60)
Glucose, Bld: 164 mg/dL — ABNORMAL HIGH (ref 70–99)
Potassium: 4 mmol/L (ref 3.5–5.1)
Sodium: 139 mmol/L (ref 135–145)
Total Bilirubin: 0.6 mg/dL (ref 0.3–1.2)
Total Protein: 5.6 g/dL — ABNORMAL LOW (ref 6.5–8.1)

## 2020-09-12 LAB — MAGNESIUM: Magnesium: 2.3 mg/dL (ref 1.7–2.4)

## 2020-09-12 LAB — PHOSPHORUS: Phosphorus: 2.9 mg/dL (ref 2.5–4.6)

## 2020-09-12 MED ORDER — ADULT MULTIVITAMIN W/MINERALS CH
1.0000 | ORAL_TABLET | Freq: Every day | ORAL | Status: DC
  Administered 2020-09-13: 1 via ORAL
  Filled 2020-09-12: qty 1

## 2020-09-12 MED ORDER — BISACODYL 10 MG RE SUPP: 10.0000 mg | Freq: Every day | RECTAL | Status: DC | PRN

## 2020-09-12 NOTE — Progress Notes (Addendum)
Nutrition Follow Up Note   DOCUMENTATION CODES:   Not applicable  INTERVENTION:   Magic cup TID with meals, each supplement provides 290 kcal and 9 grams of protein   Ensure Enlive po BID, each supplement provides 350 kcal and 20 grams of protein  MVI po daily   48 hour calorie count per MD request   Pt at high refeed risk; recommend monitor potassium, magnesium and phosphorus labs daily until stable  NUTRITION DIAGNOSIS:   Inadequate oral intake related to acute illness (infection, poor appetite and limited fluid acceptance) as evidenced by per patient/family report (son reports poor intake x 3 days, per MD- UTI, dehydration, AKI on admission).  GOAL:   Patient will meet greater than or equal to 90% of their needs  -not met   MONITOR:   PO intake,Supplement acceptance,Labs,Weight trends,Skin,I & O's  ASSESSMENT:   87 year old female with past medical history significant for but not limited to essential hypertension, type 2 diabetes mellitus, peripheral neuropathy, history of spinal stenosis and chronic pain, hypothyroidism, vitamin D deficiency as well as other comorbidities who presented to the emergency room on 09/04/2020 with worsening confusion.  RD working remotely.  Pt continues to have poor appetite and oral intake in hospital; pt eating only sips and bites of meals. Pt is being offered supplements. Pt seen by SLP who recommended dysphagia 1/thin liquid diet. MD requesting calorie count. Palliative care following for GOC. Pt does not want a feeding tube per MD note. Pt remains at refeed risk. No new weight since 3/20; will request daily weights. Spoke with RN who will place calorie count envelope on door and record daily intakes. RD will follow-up with calorie count results 24-48 hours. No BM noted since 3/18; MD notified.   Medications reviewed and include: lovenox, synthroid, NaCl w/ 5% dextrose _0 /hr  Labs reviewed: K 4.0 wnl, P 2.9 wnl, Mg 2.3 wnl Hgb 10.8(L),  Hct 34.7(L)  Diet Order:   Diet Order            DIET - DYS 1 Room service appropriate? Yes; Fluid consistency: Thin  Diet effective now                EDUCATION NEEDS:   Not appropriate for education at this time  Skin:  Skin Assessment: Reviewed RN Assessment skin tears  Last BM:  3/18  Height:   Ht Readings from Last 1 Encounters:  09/05/20 _1  (1.6 m)    Weight:   Wt Readings from Last 1 Encounters:  09/05/20 57.1 kg    Ideal Body Weight:   52 kg  BMI:  Body mass index is 22.3 kg/m.  Estimated Nutritional Needs:   Kcal:  1300-1500kcal/day  Protein:  65-75g/day  Fluid:  1.3-1.5L/day  Koleen Distance MS, RD, LDN Please refer to [facility] for RD and/or RD on-call/weekend/after hours pager

## 2020-09-12 NOTE — Progress Notes (Signed)
PROGRESS NOTE    Lori Ellis  UMP:[id number] DOB: 10/08/1920 DOA: 09/04/2020 PCP: Truett Perna, MD   Brief Narrative:  The patient is a 87 year old female with past medical history significant for but not limited to essential hypertension, type 2 diabetes mellitus, peripheral neuropathy, history of spinal stenosis and chronic pain, hypothyroidism, vitamin D deficiency as well as other comorbidities who presented to the emergency room on 09/04/2020 with worsening confusion.  She currently lives in assisted living facility is recently hospitalized for syncopal episode and found to have urinary tract infection at that time of discharged on cefdinir.  She is brought back into the hospital by her son due to worsening mentation.  UA was noted to be abnormal.  She was continued on ceftriaxone.  MRI brain was obtained given her encephalopathy and did not show any acute infarct but did show atrophy and chronic small vessel disease and chronic infarcts noted.  EEG was done and was suggestive of epileptiform activity in the right temporal lobe.  Patient was less responsive yesterday and has further declined today and palliative recommending transitioning to focus to a more comfort approach.  Neurology to evaluate prior to the family make any final decisions about transition to comfort care.  Neurology changed the patient's antiepileptic from Depakote to Keppra and have ordered a repeat EEG they are recommending delirium precautions and avoiding Haldol and checking a thiamine level and repeating thiamine after level has been checked.  Dr. Wilford Corner also recommended changing antibiotics to a non-cephalosporin; overnight she did spike a temperature of 101.3 and blood cultures have been ordered however she is more somnolent and even less responsive today compared to yesterday.  Neurology will follow up on the EEG and if all the toxic metabolic derangements are optimized and she continues not improve then neurology recommends  asked her to consider comfort measures only however she is improving now and she is more awake and alert today than she was yesterday and she has been laughing and cracking jokes with her sons.  She is still altered but more awake and alert and will repeat her SLP.  Repeat EEG showed bitemporal slowing suggestive of bitemporal focal neuronal dysfunction but was not consistent with ongoing seizures.  Neurology recommended continuing low-dose Keppra 250 mg p.o. twice daily  Repeat speech evaluation showed that she was cleared for dysphagia 1 diet with thin liquids.  She is still awake today but not as alert as yesterday.  Her appetite has not been very good today and so we have started a calorie count.  She has made her wishes clear and she does not want artificial feeding.  If patient is unable to maintain her oral nutrition and caloric intake we will need to discuss with the family about transitioning goals of care to comfort.  Patient is more awake today on 09/12/2020 and does interact but is agitated and per son has been eating very minimally.  We will continue her current calorie count and will need to continue to monitor intake.  If she is not able to maintain her oral intake will likely need to be transitioned to comfort care given her wishes.  Assessment & Plan:   Active Problems:   Hypothyroidism   Type II diabetes mellitus with renal manifestations (HCC)   Benign essential HTN   Acute lower UTI   Metabolic encephalopathy   AKI (acute kidney injury) (HCC)   Acute metabolic encephalopathy  Acute Metabolic Ecephalopathy/recent vasovagal syncope, improving slowly now and she was more  awake yesterday but slightly more somnolent today -Likely multifactorial including incompletely treated UTI, dehydration and acute kidney injury.   -Patient did not exhibit any focal deficits.   -CT head did not show any focal findings.   -Patient was hospitalized a few days ago for syncope thought to be due to  vasovagal.   -Further work-up was pursued.  MRI brain was done yesterday which shows atrophy and findings suggestive of remote infarcts.  No acute infarct identified. -Folic acid B12 level was normal.   -TSH was normal when checked recently. -Continue to treat UTI  -EEG done on 09/06/2020 did raise concern for epileptiform activity in the right temporal area; patient was initiated on loaded with IV Depakote and started on p.o. Depakote however her mentation has significantly worsened and she is more somnolent and difficult to arouse so the Depakote was changed back to IV today and now has been stopped by neurology and changed to Keppra and they are recommending continue Keppra to 50 mg p.o. twice daily -Neurology formally consulted for further evaluation recommendations repeat EEG was reviewed by the neurologist Dr. Otelia Limes and EEG was not consistent with ongoing seizures but did reveal bitemporal slowing and suggestive of bitemporal focal neuronal dysfunction -Palliative care is also following and if neurology no further recommendations and the recommendation would be to transition the patient to comfort care; neurology discharge optimize all her toxic metabolic derangement and changing and reevaluate her EEG however I feel that she is worsening and continues to decline -Neurology agrees with holding her home Keppra and pain medications and avoiding Haldol as well and recommending delirium precautions and check thiamine level and repeating thiamine after checking level -We will give her high-dose IV thiamine after her B1 level is drawn and start 500 mg IV q8h scheduled for 3 days and then taper -Patient was much more awake and alert today but still confused a little bit so will obtain SLP evaluation for swallowing; SLP V done and she is cleared for dysphagia 1 diet.  Family thinks she is doing little bit better yesterday however today they think she is a little worse even though she is still awake and  responsive she is more somnolent -I am clinically concerned about her oral intake so I ordered a calorie count yesterday.  Per her wishes she did not want any artificial feeding and no PEG tube and will not even attempt to place a Cortrak feeding tube; Follow up on Calorie Count after 48 Hours but today she has not been eating very much and her son describes it as "minimal  Concern for Epilepsy, ruled oud -Patient underwent EEG 3/21 which does raise concern for epileptiform activity in the right temporal area.   -Discussed with Dr. Otelia Limes with neurology who recommends loading with Depakote and then starting 3 times a day dosage with 375 mcg.    Now Depakote has been stopped and changed to IV Keppra without load at 250 mg twice daily -Ammonia level normal.  LFTs normals as AST is 12 and ALT is 9 -Check Depakote level during hospitalization and was 20 -Neurology was formally consulted given family request because she has not improved with the Depakote that was initiated and this has been changed to IV Keppra and EEG is being repeated as above -She was placed on a full liquid diet but unfortunately had not been able to eat given her encephalopathy and will need SLP re-evaluation and she is cleared for dysphagia 1 diet  Recently Diagnosed UTI -  Urine cultures growing E. coli.   -Noted to be pansensitive.   -Continue IV ceftriaxone for now.   -Follow-up on blood cultures.  Plan for 5-day course. -We will respect her temperature overnight of 101.3 so we will repeat blood cultures x2 now -WBC trended up to 11.2 and has now Normalized at 9.8 yesterday and today it is 7.8  Acute kidney injury on chronic kidney disease stage IIIb/hypokalemia Metabolic Acidosis  -Likely dehydrated from poor oral intake.   -Baseline creatinine around 1.  Lisinopril is on hold.   -Patient's creatinine has improved with IV hydration.   -Continued gentle IV fluids and fluids had been resumed to D5 normal saline at 75  mL's per hour +20 mEq of KCl but will change to Sodium Bicarbonate gtt in D5W at 75 mL/hr given Acidosis  -Patient has a mild metabolic acidosis and worsened slgihtly as CO2 is 18, anion gap of 11, chloride level of 110; NOW Acidosis is improved as CO2 is 23, chloride level is 108, anion gap is 13 -Patient's oral intake remained poor and she is more awake today so will get SLP re-evaluation -Continue to monitor and trend and avoid nephrotoxic medications, contrast dyes, hypotension and renally dose medications -Repeat CMP in a.m.  Essential Hypertension -Unable to take oral meds due to her alteration in mental status.   -Use hydralazine as needed.   -Blood pressure is reasonably well controlled.  Occasional high readings noted. -Continue to monitor blood pressures per protocol and last blood pressure reading was 149/72  Hypothyroidism -TSH was 2.8 on 3/17 -Changed her p.o. levothyroxine to IV the day before yesterday and will continue for now  Diabetes Mellitus Type 2, non-insulin-dependent -HbA1c 6.6 when checked recently.   -Check glucose levels once a day.  No SSI for Now. -CBG's ranging from 126-192; today it was 164 this morning  Chronic low back pain/lumbar spinal stenosis -Tramadol is being held currently .  PT and OT but she is not awake enough to even participate today. -Usually gets around in a wheelchair at baseline.  History of peripheral neuropathy -On gabapentin at home which is currently on hold due to her encephalopathy and her inability to swallow  Leukocytosis -Mild -Patient's WBC went from 11.1 -> 7.8 today -Continue to monitor for signs and symptoms of infection she is on IV ceftriaxone for now -Repeat CBC in a.m.   Hypokalemia -Paitent's K+ is now 4.0 -Continue to Monitor and Replete as Necessary -Repeat CMP in the AM   Hypophosphatemia -Patient's Phos Level was 2.0 and is now 2.7 -Continue to Monitor and Replete as Necessary -Repeat Phos Level in  the AM   Thrombocytopenia -Patient's platelet count has steadily dropped and went from 231 -> 171 -> 157 -> 149 -> 151 -> 138 -> 140 -> 174 and improved -Unclear etiology  -Continue to monitor for signs and symptoms of bleeding; currently no overt bleeding noted -Repeat CBC in a.m.  Goals of Care -Patient is DNR -Prognosis is guarded at this time but she is slowly improving and much more awake today than the last few days.   -Patient has not improved the initiation of Depakote but then declined and now is improving on Keppra.   -She has not shown much improvement in the last 6 days otherwise.   -After discussion with patient's son yesterday palliative Care has been consulted and family requesting continuing current plan of care with pending neurology input.  If no further treatment options are available they would focus  on the patient's comfort and symptom management allow her to be at peace with what time she is left; Continue current care and Neuro feels she is not having seizures but recommending Keppra 250 mg BID; She is improved and awoke today but if she declines and feels to continue to eat and does not improve she would be Hospice appropriate  -Likely the patient would NOT be able to return to her assisted living at [facility]; Will need PT/OT to re-evaluate but will follow up on her calorie count  DVT prophylaxis: Enoxaparin 30 mg sq q24h Code Status: DO NOT RESUSCITATE Family Communication: Spoke to her Son at bedside  Disposition Plan: Pending further clinical improvement; patient has a very high risk for decompensation given her worsening status and palliative care is involved in goals of care discussion.  Neurology to weigh in and if they have no further recommendations patient is appropriate to be transition to hospice care given her advanced age, frail status and comorbidities given her lack of improvement  Status is: Inpatient  Remains inpatient appropriate because:Unsafe d/c  plan, IV treatments appropriate due to intensity of illness or inability to take PO and Inpatient level of care appropriate due to severity of illness   Dispo:  Patient From: Assisted Living Facility  Planned Disposition: Skilled Nursing Facility  Medically stable for discharge: No     Consultants:   Palliative Care    Procedures:  EEG  This is an abnormal waking and drowsy study due to 1) attenuated organization as described above suggestive of a mild diffuse encephalopathy, with 2) superimposed focal neuronal dysfunction in the right greater than left bitemporal region, and 3) occasional, isolated right temporal discharges suggestive of an epileptiform foci in this region.  There was no evidence of seizure activity however.  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    Antimicrobials:  Anti-infectives (From admission, onward)   Start     Dose/Rate Route Frequency Ordered Stop   09/05/20 2100  cefTRIAXone (ROCEPHIN) 1 g in sodium chloride 0.9 % 100 mL IVPB        1 g 200 mL/hr over 30 Minutes Intravenous Every 24 hours 09/04/20 2334 09/10/20 2208   09/04/20 2045  cefTRIAXone (ROCEPHIN) 1 g in sodium chloride 0.9 % 100 mL IVPB        1 g 200 mL/hr over 30 Minutes Intravenous  Once 09/04/20 2031 09/05/20 0027        Subjective: Seen and  examined at bedside and she was more awake and alert today than she was yesterday and able to tell me her name.  Was a little agitated and combative.  Son states that she has been taking minimal amounts of oral intake.  She has been refusing and not wanting to eat.  No other concerns or complaints at this time but son feels that she is doing little bit better than yesterday but not to the point where she has been eating.  Patient unable  to verbalize any complaints but appears that she wants her soft mitten restraints out.  Objective: Vitals:   09/11/20 2034 09/12/20 0445 09/12/20 1257 09/12/20 1259  BP: 121/84 (!) 141/78 (!) 149/72   Pulse: 99 88 80 85  Resp: Temp: 98.9 F (37.2 C) 98.7 F (37.1 C) 98.1 F (36.7 C)   TempSrc: Oral Oral Oral   SpO2: 100% 97%  99%  Weight:      Height:        Intake/Output Summary (Last 24 hours) at 09/12/2020 1512 Last data filed at 09/12/2020 0500 Gross per 24 hour  Intake 1083.77 ml  Output 500 ml  Net 583.77 ml   Filed Weights   09/04/20 1806 09/05/20 0124  Weight: 58.5 kg 57.1 kg   Examination: Physical Exam:  Constitutional: The patient is a thin elderly chronically ill-appearing Caucasian female who is much more awake today than she was yesterday and interacts but is still pleasantly demented and a little agitated Eyes: Lids and conjunctivae normal, sclerae anicteric  ENMT: External Ears, Nose appear normal. Grossly normal hearing.  Neck: Appears normal, supple, no cervical masses, normal ROM, no appreciable thyromegaly; no JVD Respiratory: Diminished to auscultation bilaterally, no wheezing, rales, rhonchi or crackles. Normal respiratory effort and patient is not tachypenic. No accessory muscle use.  Cardiovascular: RRR, no murmurs / rubs / gallops. S1 and S2 auscultated. No extremity edema. Abdomen: Soft, non-tender, non-distended.  Bowel sounds positive.  GU: Deferred. Musculoskeletal: No clubbing / cyanosis of digits/nails.  No joint deformity upper and lower extremities but is wearing soft mitten restraints Skin: No rashes, lesions, ulcers on limited skin evaluation. No induration; Warm and dry.  Neurologic: CN 2-12 grossly intact with no focal deficits. Romberg sign and cerebellar reflexes not assessed.  Psychiatric: Impaired judgment and insight. Alert and oriented x 1.  Slightly agitated mood and appropriate affect.   Data Reviewed: I have personally reviewed following labs and imaging studies  CBC: Recent Labs  Lab 09/08/20 0516 09/09/20 0505 09/10/20 0750 09/11/20 0558 09/12/20 0453  WBC 11.2* 10.9* 10.3 9.8 7.8  NEUTROABS  --  7.4 7.6 6.6 5.2  HGB 12.1 11.9* 12.6 12.0 10.8*  HCT 38.8 37.9 40.4 37.8 34.7*  MCV 96.3 95.0 96.9 95.7 96.4  PLT 149* 151 138* 140* 174   Basic Metabolic Panel: Recent Labs  Lab 09/08/20 0516 09/09/20 0505 09/10/20 0750 09/11/20 0558 09/12/20 0453  NA 141 141 139 139 139  K 3.9 3.6 3.2* 4.3 4.0  CL 110 111 110 105 108  CO2 20* 21* 18* 24 23  GLUCOSE 126* 178* 186* 192* 164*  BUN 27* CREATININE 0.87 0.92 0.63 0.81 0.79  CALCIUM 9.1 8.8* 8.9 8.9 8.5*  MG 2.1 2.0 1.9 1.8 2.3  PHOS  --  2.5 2.0* 2.7 2.9   GFR: Estimated Creatinine Clearance: 33.3 mL/min (by C-G formula based on SCr of 0.79 mg/dL). Liver Function Tests: Recent Labs  Lab 09/08/20 0516 09/09/20 0505 09/10/20 0750 09/11/20 0558 09/12/20 0453  AST 17 13* 13* 16 12*  ALT ALKPHOS 65 59 61 57 55  BILITOT 0.8 0.5 0.6 0.8 0.6  PROT 6.8 6.4* 6.7 6.2* 5.6*  ALBUMIN 3.4* 3.0* 3.0* 2.8* 2.6*   No results for input(s): LIPASE, AMYLASE in the last 168 hours. Recent Labs  Lab 09/07/20 0925  AMMONIA 12   Coagulation Profile: No results for input(s): INR, PROTIME in the last  168 hours. Cardiac Enzymes: No results for input(s): CKTOTAL, CKMB, CKMBINDEX, TROPONINI in the last 168 hours. BNP (last 3 results) No results for input(s): PROBNP in the last 8760  hours. HbA1C: No results for input(s): HGBA1C in the last 72 hours. CBG: No results for input(s): GLUCAP in the last 168 hours. Lipid Profile: No results for input(s): CHOL, HDL, LDLCALC, TRIG, CHOLHDL, LDLDIRECT in the last 72 hours. Thyroid Function Tests: No results for input(s): TSH, T4TOTAL, FREET4, T3FREE, THYROIDAB in the last 72 hours. Anemia Panel: No results for input(s): VITAMINB12, FOLATE, FERRITIN, TIBC, IRON, RETICCTPCT in the last 72 hours. Sepsis Labs: No results for input(s): PROCALCITON, LATICACIDVEN in the last 168 hours.  Recent Results (from the past 240 hour(s))  Culture, Urine     Status: Abnormal   Collection Time: 09/02/20  7:15 PM   Specimen: Urine, Random  Result Value Ref Range Status   Specimen Description   Final    URINE, RANDOM Performed at [facility], [address], [place], [place] [address]    Special Requests   Final    NONE Performed at [facility], [address], [place], [place] [address]    Culture >=100,000 COLONIES/mL ESCHERICHIA COLI (A)  Final   Report Status 09/05/2020 FINAL  Final   Organism ID, Bacteria ESCHERICHIA COLI (A)  Final      Susceptibility   Escherichia coli - MIC*    AMPICILLIN 8 SENSITIVE Sensitive     CEFAZOLIN <=4 SENSITIVE Sensitive     CEFEPIME <=0.12 SENSITIVE Sensitive     CEFTRIAXONE <=0.25 SENSITIVE Sensitive     CIPROFLOXACIN <=0.25 SENSITIVE Sensitive     GENTAMICIN <=1 SENSITIVE Sensitive     IMIPENEM <=0.25 SENSITIVE Sensitive     NITROFURANTOIN <=16 SENSITIVE Sensitive     TRIMETH/SULFA <=20 SENSITIVE Sensitive     AMPICILLIN/SULBACTAM 4 SENSITIVE Sensitive     PIP/TAZO <=4 SENSITIVE Sensitive     * >=100,000 COLONIES/mL ESCHERICHIA COLI  Urine Culture     Status: Abnormal   Collection Time: 09/04/20  6:51 PM   Specimen: Urine, Random  Result Value Ref Range Status   Specimen Description   Final    URINE, RANDOM Performed at [facility], [address] [place], [place] [address]    Special Requests   Final    NONE Performed at [facility], [address] [place], [place] [address]    Culture >=100,000 COLONIES/mL ESCHERICHIA COLI (A)  Final   Report Status 09/07/2020 FINAL  Final   Organism ID, Bacteria ESCHERICHIA COLI (A)  Final      Susceptibility   Escherichia coli - MIC*    AMPICILLIN 8 SENSITIVE Sensitive     CEFAZOLIN <=4 SENSITIVE Sensitive     CEFEPIME <=0.12 SENSITIVE Sensitive     CEFTRIAXONE <=0.25 SENSITIVE Sensitive     CIPROFLOXACIN <=0.25 SENSITIVE Sensitive     GENTAMICIN <=1 SENSITIVE Sensitive     IMIPENEM <=0.25 SENSITIVE Sensitive     NITROFURANTOIN <=16 SENSITIVE Sensitive     TRIMETH/SULFA <=20 SENSITIVE Sensitive     AMPICILLIN/SULBACTAM 4 SENSITIVE Sensitive     PIP/TAZO <=4 SENSITIVE Sensitive     * >=100,000 COLONIES/mL ESCHERICHIA COLI  Resp Panel by RT-PCR (Flu A&B, Covid) Nasopharyngeal Swab     Status: None   Collection Time: 09/04/20  9:25 PM   Specimen: Nasopharyngeal Swab; Nasopharyngeal(NP) swabs in vial transport medium  Result Value Ref Range Status  SARS Coronavirus 2 by RT PCR NEGATIVE NEGATIVE Final    Comment: (NOTE) SARS-CoV-2 target nucleic acids are NOT DETECTED.  The SARS-CoV-2 RNA is generally detectable in upper respiratory specimens during the acute phase of infection. The lowest concentration of SARS-CoV-2 viral copies this assay can detect is 138 copies/mL. A negative result does not preclude SARS-Cov-2 infection and should not be used as the sole basis for treatment or other patient management decisions. A negative result may occur with  improper specimen collection/handling, submission of specimen other than nasopharyngeal swab, presence of viral mutation(s) within the areas targeted by this assay, and inadequate number of viral copies(<138 copies/mL). A negative result must be combined with clinical observations, patient  history, and epidemiological information. The expected result is Negative.  Fact Sheet for Patients:  BloggerCourse.com  Fact Sheet for Healthcare Providers:  SeriousBroker.it  This test is no t yet approved or cleared by the [place] FDA and  has been authorized for detection and/or diagnosis of SARS-CoV-2 by FDA under an Emergency Use Authorization (EUA). This EUA will remain  in effect (meaning this test can be used) for the duration of the COVID-19 declaration under Section 564(b)(1) of the Act, 21 U.S.C.section 360bbb-3(b)(1), unless the authorization is terminated  or revoked sooner.       Influenza A by PCR NEGATIVE NEGATIVE Final   Influenza B by PCR NEGATIVE NEGATIVE Final    Comment: (NOTE) The Xpert Xpress SARS-CoV-2/FLU/RSV plus assay is intended as an aid in the diagnosis of influenza from Nasopharyngeal swab specimens and should not be used as a sole basis for treatment. Nasal washings and aspirates are unacceptable for Xpert Xpress SARS-CoV-2/FLU/RSV testing.  Fact Sheet for Patients: BloggerCourse.com  Fact Sheet for Healthcare Providers: SeriousBroker.it  This test is not yet approved or cleared by the [place] FDA and has been authorized for detection and/or diagnosis of SARS-CoV-2 by FDA under an Emergency Use Authorization (EUA). This EUA will remain in effect (meaning this test can be used) for the duration of the COVID-19 declaration under Section 564(b)(1) of the Act, 21 U.S.C. section 360bbb-3(b)(1), unless the authorization is terminated or revoked.  Performed at [facility], [address], [place], [place] [address]   Blood culture (routine x 2)     Status: None   Collection Time: 09/04/20  9:40 PM   Specimen: BLOOD  Result Value Ref Range Status   Specimen Description   Final    BLOOD SITE NOT SPECIFIED Performed  at [facility], [address] [place], [place] [address]    Special Requests   Final    BOTTLES DRAWN AEROBIC AND ANAEROBIC Blood Culture adequate volume Performed at [facility], [address] [place], [place] [address]    Culture   Final    NO GROWTH 5 DAYS Performed at [facility], [address] [place], [place] [address]    Report Status 09/10/2020 FINAL  Final  Culture, blood (routine x 2)     Status: None (Preliminary result)   Collection Time: 09/09/20 10:08 AM   Specimen: BLOOD  Result Value Ref Range Status   Specimen Description   Final    BLOOD BLOOD LEFT FOREARM Performed at [facility], [address], [place], [place] [address]    Special Requests   Final    BOTTLES DRAWN AEROBIC AND ANAEROBIC Blood Culture adequate volume Performed at [facility], [address] [place], [place]  [id number]    Culture   Final    NO GROWTH 2 DAYS Performed at [facility], [address] [place], [place] [id number]    Report Status PENDING  Incomplete  Culture, blood (routine x 2)     Status: None (Preliminary result)   Collection Time: 09/09/20 10:10 AM   Specimen: BLOOD  Result Value Ref Range Status   Specimen Description   Final    BLOOD LEFT WRIST Performed at [facility], [address] [place], [place] [address]    Special Requests   Final    BOTTLES DRAWN AEROBIC AND ANAEROBIC Blood Culture adequate volume Performed at [facility], [address] [place], [place] [address]    Culture   Final    NO GROWTH 2 DAYS Performed at [facility], [address] [place], [place] [address]    Report Status PENDING  Incomplete    RN Pressure Injury Documentation:    Estimated body mass index is 22.3 kg/m as calculated from the following:   Height as of this encounter: 5\' 3"  (1.6 m).   Weight as of this encounter: 57.1 kg.  Malnutrition  Type:  Nutrition Problem: Inadequate oral intake Etiology: acute illness (infection, poor appetite and limited fluid acceptance)  Malnutrition Characteristics:  Signs/Symptoms: per patient/family report (son reports poor intake x 3 days, per MD- UTI, dehydration, AKI on admission)  Nutrition Interventions:  Interventions: Ensure Enlive (each supplement provides 350kcal and 20 grams of protein),Magic cup,Liberalize Diet   Radiology Studies: No results found. Scheduled Meds: . enoxaparin (LOVENOX) injection  30 mg Subcutaneous Q24H  . feeding supplement  237 mL Oral BID BM  . levothyroxine  37.5 mcg Intravenous Daily  . polyvinyl alcohol  2 drop Both Eyes TID   Continuous Infusions: . dextrose 5 % and 0.9 % NaCl with KCl 20 mEq/L 75 mL/hr at 09/12/20 0439  . levETIRAcetam 250 mg (09/12/20 0923)    LOS: 7 days   Merlene Laughtermair Latif Sheikh, DO [facility] PAGER is on AMION  If 7PM-7AM, please contact night-coverage [email or web address]

## 2020-09-13 DIAGNOSIS — I10 Essential (primary) hypertension: Secondary | ICD-10-CM | POA: Diagnosis not present

## 2020-09-13 DIAGNOSIS — R627 Adult failure to thrive: Secondary | ICD-10-CM

## 2020-09-13 DIAGNOSIS — N179 Acute kidney failure, unspecified: Secondary | ICD-10-CM | POA: Diagnosis not present

## 2020-09-13 DIAGNOSIS — E039 Hypothyroidism, unspecified: Secondary | ICD-10-CM | POA: Diagnosis not present

## 2020-09-13 DIAGNOSIS — N390 Urinary tract infection, site not specified: Secondary | ICD-10-CM | POA: Diagnosis not present

## 2020-09-13 DIAGNOSIS — G9340 Encephalopathy, unspecified: Secondary | ICD-10-CM

## 2020-09-13 LAB — CBC WITH DIFFERENTIAL/PLATELET
Abs Immature Granulocytes: 0.06 10*3/uL (ref 0.00–0.07)
Basophils Absolute: 0.1 10*3/uL (ref 0.0–0.1)
Basophils Relative: 1 %
Eosinophils Absolute: 0.3 10*3/uL (ref 0.0–0.5)
Eosinophils Relative: 4 %
HCT: 32.9 % — ABNORMAL LOW (ref 36.0–46.0)
Hemoglobin: 10.1 g/dL — ABNORMAL LOW (ref 12.0–15.0)
Immature Granulocytes: 1 %
Lymphocytes Relative: 25 %
Lymphs Abs: 1.8 10*3/uL (ref 0.7–4.0)
MCH: 30 pg (ref 26.0–34.0)
MCHC: 30.7 g/dL (ref 30.0–36.0)
MCV: 97.6 fL (ref 80.0–100.0)
Monocytes Absolute: 1 10*3/uL (ref 0.1–1.0)
Monocytes Relative: 13 %
Neutro Abs: 4.1 10*3/uL (ref 1.7–7.7)
Neutrophils Relative %: 56 %
Platelets: 165 10*3/uL (ref 150–400)
RBC: 3.37 MIL/uL — ABNORMAL LOW (ref 3.87–5.11)
RDW: 14.1 % (ref 11.5–15.5)
WBC: 7.3 10*3/uL (ref 4.0–10.5)
nRBC: 0 % (ref 0.0–0.2)

## 2020-09-13 LAB — COMPREHENSIVE METABOLIC PANEL
ALT: 10 U/L (ref 0–44)
AST: 13 U/L — ABNORMAL LOW (ref 15–41)
Albumin: 2.5 g/dL — ABNORMAL LOW (ref 3.5–5.0)
Alkaline Phosphatase: 52 U/L (ref 38–126)
Anion gap: 6 (ref 5–15)
BUN: 14 mg/dL (ref 8–23)
CO2: 25 mmol/L (ref 22–32)
Calcium: 8.6 mg/dL — ABNORMAL LOW (ref 8.9–10.3)
Chloride: 110 mmol/L (ref 98–111)
Creatinine, Ser: 0.78 mg/dL (ref 0.44–1.00)
GFR, Estimated: >60 mL/min (ref >60)
Glucose, Bld: 148 mg/dL — ABNORMAL HIGH (ref 70–99)
Potassium: 4.1 mmol/L (ref 3.5–5.1)
Sodium: 141 mmol/L (ref 135–145)
Total Bilirubin: 0.4 mg/dL (ref 0.3–1.2)
Total Protein: 5.4 g/dL — ABNORMAL LOW (ref 6.5–8.1)

## 2020-09-13 LAB — PHOSPHORUS: Phosphorus: 2.8 mg/dL (ref 2.5–4.6)

## 2020-09-13 LAB — MAGNESIUM: Magnesium: 1.9 mg/dL (ref 1.7–2.4)

## 2020-09-13 MED ORDER — THIAMINE HCL 100 MG/ML IJ SOLN
100.0000 mg | Freq: Every day | INTRAMUSCULAR | Status: DC
  Administered 2020-09-13: 100 mg via INTRAVENOUS
  Filled 2020-09-13: qty 2

## 2020-09-13 MED ORDER — MORPHINE SULFATE (CONCENTRATE) 10 MG/0.5ML PO SOLN
5.0000 mg | ORAL | Status: DC | PRN
  Administered 2020-09-14: 5 mg via ORAL
  Filled 2020-09-13: qty 0.5

## 2020-09-13 MED ORDER — LEVETIRACETAM 250 MG PO TABS
250.0000 mg | ORAL_TABLET | Freq: Two times a day (BID) | ORAL | Status: DC
  Administered 2020-09-13 – 2020-09-16 (×7): 250 mg via ORAL
  Filled 2020-09-13 (×7): qty 1

## 2020-09-13 NOTE — Progress Notes (Signed)
Nutrition Follow-up  DOCUMENTATION CODES:   Not applicable  INTERVENTION:  - continue Ensure Enlive BID and Magic Cup TID.  - RD will follow-up 3/29 for day #2 of Calorie Count results.   NUTRITION DIAGNOSIS:   Inadequate oral intake related to acute illness,lethargy/confusion as evidenced by per patient/family report (son reports poor intake x 3 days, per MD- UTI, dehydration, AKI on admission). -ongoing  GOAL:   Patient will meet greater than or equal to 90% of their needs -unmet  MONITOR:   PO intake,Supplement acceptance,Labs,Weight trends,Skin,I & O's  REASON FOR ASSESSMENT:   Consult Calorie Count  ASSESSMENT:   87 year old female with past medical history significant for but not limited to essential hypertension, type 2 diabetes mellitus, peripheral neuropathy, history of spinal stenosis and chronic pain, hypothyroidism, vitamin D deficiency as well as other comorbidities who presented to the emergency room on 09/04/2020 with worsening confusion.  Able to talk with RN who had patient over the weekend and to MD. Patient's son had been in the room but by the time RD was able to enter room, MD was assessing patient and then someone from leadership stopped to talk with patient's son. After those visits, patient's son left and RD was unable to talk with him.  Patient laying in bed with no other family/visitors present. She has eyes closed and does not verbalize or acknowledge RD in any way throughout visit, including during NFPE.   Documented intakes from 3/26 were 0% of breakfast and 0% of lunch.  Total intakes from 3/27, much of which was bites and sips, and including oral nutrition supplements: 500 kcal (38% kcal need) and 19 grams protein (29% protein need).  No plan for NGT or PEG for TF.   Weight today is consistent with admission (3/19) weight.     Labs reviewed; Ca: 8.6 mg/dl. Medications reviewed; 37.5 mcg IV synthroid/day, 1 tablet multivitamin with  minerals/day. IVF; D5-NS @ 75 ml/hr (306 kcal/24 hours).    NUTRITION - FOCUSED PHYSICAL EXAM:  Flowsheet Row Most Recent Value  Orbital Region No depletion  Upper Arm Region No depletion  Thoracic and Lumbar Region Unable to assess  Buccal Region No depletion  Temple Region No depletion  Clavicle Bone Region Mild depletion  Clavicle and Acromion Bone Region Mild depletion  Scapular Bone Region Unable to assess  Dorsal Hand No depletion  Patellar Region No depletion  Anterior Thigh Region Unable to assess  Posterior Calf Region No depletion  Edema (RD Assessment) None  Hair Reviewed  Eyes Unable to assess  Mouth Reviewed  Skin Reviewed  Nails Reviewed       Diet Order:   Diet Order            DIET - DYS 1 Room service appropriate? Yes; Fluid consistency: Thin  Diet effective now                 EDUCATION NEEDS:   Not appropriate for education at this time  Skin:  Skin Assessment: Reviewed RN Assessment  Last BM:  3/25  Height:   Ht Readings from Last 1 Encounters:  09/05/20 5\' 3"  (1.6 m)    Weight:   Wt Readings from Last 1 Encounters:  09/13/20 59 kg     Estimated Nutritional Needs:  Kcal:  1300-1500kcal/day Protein:  65-75g/day Fluid:  1.3-1.5L/day      09/15/20, MS, RD, LDN, CNSC Inpatient Clinical Dietitian RD pager # available in AMION  After hours/weekend pager # available in [facility]

## 2020-09-13 NOTE — TOC Progression Note (Signed)
Transition of Care [facility]) - Progression Note    Patient Details  Name: Lori Ellis MRN: [id number] Date of Birth: 1921/03/10  [facility] CM/SW Contact  Iniko Robles, Olegario Messier, RN Phone Number: 09/13/2020, 10:15 AM  Clinical Narrative: Following for d/c plans-currently palliative following-ongoing discussions with son [name]. Will need PT recc if able to participate to decide best d/c plan.Higher level needed.       Barriers to Discharge: Continued Medical Work up  Expected Discharge Plan and Services Expected Discharge Plan: Long Term Nursing Home       Living arrangements for the past 2 months: Assisted Living Facility                                       Social Determinants of Health (SDOH) Interventions    Readmission Risk Interventions No flowsheet data found.

## 2020-09-13 NOTE — Care Management Important Message (Signed)
Important Message  Patient Details IM Letter placed in Patient's room. Name: Lori Ellis MRN: [id number] Date of Birth: [birth year]   Medicare Important Message Given:  Yes     Caren Macadam 09/13/2020, 12:44 PM

## 2020-09-13 NOTE — Progress Notes (Signed)
Patient ID: Lori Ellis, female   DOB: Aug 30, 1920, 87 y.o.   MRN: [id number]  Medical records reviewed   87 y.o.femalewith past medical history oftype 2 diabetes, peripheral neuropathy, chronic pain, hypothyroidism, hypertension, and chronic kidney disease stage III. Patient presented to the ED from [facility] assisted living facility with concerns for altered mental status. Patient was recently hospitalized for syncopal episodes and also treated for UTI (discharged on antibiotics). CT of head showed no acute abnormalities. Chest x-ray unremarkable. 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  Today is day 8 of this hospital stay  Patient has continued to fail to thrive during this hospital stay in spite of full medical support.  Poor po intake and mentation waxes and wanes.  Currently I am unable to arouse her, she groans to stimulation    Patient has documented declaration of a natural death.      This NP met  at the bedside, along with her two sons Fritz Pickerel and [name]) as a follow up for palliative medicine  needs and clarification of GOCs.   Education/ conversation regarding current medcial situation; diagnosis, prognosis, GOCs, EOL wishes,  disposition and options.   Concepts specific to human mortality and the limitations of medical interventions to prolong quality of life when a body fails to thrive discussed.   The difference between and aggressive medical intervention path and a pallitaive comfort path was detailed   Focus is comfort and dignity, allowing for a natural death.   Plan of Care: -DNR/DNI -no artifical feeding or hydration now or in the future        - diet as toelrated -minimize medication to only those that enhance comfort -no further diagnostics - monitor for transitions of care - plan to meet again tomorrow at 1:00pm for ongoing discussion         regarding care plan  MOST form introduced    Hard Choices left for review  Discussed with family  the  importance of continued conversation with the   medical providers regarding overall plan of care and treatment options,  ensuring decisions are within the context of the patients values and GOCs.  Questions and concerns addressed   Discussed with Dr Alfredia Ferguson and bedside RN  Total time spent on the unit was 60 minutes  Greater than 50% of the time was spent in counseling and coordination of care  Wadie Lessen NP  Palliative Medicine Team Team Phone # [phone] Pager [phone]

## 2020-09-13 NOTE — Progress Notes (Signed)
PROGRESS NOTE    Lori Ellis  ZOX:[id number] DOB: [birth year] DOA: 09/04/2020 PCP: Truett Perna, MD   Brief Narrative:  The patient is a 87 year old female with past medical history significant for but not limited to essential hypertension, type 2 diabetes mellitus, peripheral neuropathy, history of spinal stenosis and chronic pain, hypothyroidism, vitamin D deficiency as well as other comorbidities who presented to the emergency room on 09/04/2020 with worsening confusion.  She currently lives in assisted living facility is recently hospitalized for syncopal episode and found to have urinary tract infection at that time of discharged on cefdinir.  She is brought back into the hospital by her son due to worsening mentation.  UA was noted to be abnormal.  She was continued on ceftriaxone.  MRI brain was obtained given her encephalopathy and did not show any acute infarct but did show atrophy and chronic small vessel disease and chronic infarcts noted.  EEG was done and was suggestive of epileptiform activity in the right temporal lobe.  Patient was less responsive yesterday and has further declined today and palliative recommending transitioning to focus to a more comfort approach.  Neurology to evaluate prior to the family make any final decisions about transition to comfort care.  Neurology changed the patient's antiepileptic from Depakote to Keppra and have ordered a repeat EEG they are recommending delirium precautions and avoiding Haldol and checking a thiamine level and repeating thiamine after level has been checked.  Dr. Wilford Corner also recommended changing antibiotics to a non-cephalosporin; overnight she did spike a temperature of 101.3 and blood cultures have been ordered however she is more somnolent and even less responsive today compared to yesterday.  Neurology will follow up on the EEG and if all the toxic metabolic derangements are optimized and she continues not improve then neurology recommends  asked her to consider comfort measures only however she is improving now and she is more awake and alert today than she was yesterday and she has been laughing and cracking jokes with her sons.  She is still altered but more awake and alert and will repeat her SLP.  Repeat EEG showed bitemporal slowing suggestive of bitemporal focal neuronal dysfunction but was not consistent with ongoing seizures.  Neurology recommended continuing low-dose Keppra 250 mg p.o. twice daily  Repeat speech evaluation showed that she was cleared for dysphagia 1 diet with thin liquids.  She is still awake today but not as alert as yesterday.  Her appetite has not been very good today and so we have started a calorie count.  She has made her wishes clear and she does not want artificial feeding.  If patient is unable to maintain her oral nutrition and caloric intake we will need to discuss with the family about transitioning goals of care to comfort.  Patient is more awake today on 09/12/2020 and does interact but is agitated and per son has been eating very minimally.  We will continue her current calorie count and will need to continue to monitor intake.  If she is not able to maintain her oral intake will likely need to be transitioned to comfort care given her wishes.  Today on 09/13/2020 her son states that she has been the most lucid she has been since he seen her last few days.  She is much more receptive to food and ate more today than she did yesterday.  She fell back asleep later on in the morning.  Calorie count is still ongoing and discussed with nutrition  to keep it going until we figure out what her true nutritional status is.  Assessment & Plan:   Active Problems:   Hypothyroidism   Type II diabetes mellitus with renal manifestations (HCC)   Benign essential HTN   Acute lower UTI   Metabolic encephalopathy   AKI (acute kidney injury) (HCC)   Acute metabolic encephalopathy  Acute Metabolic Ecephalopathy/recent  vasovagal syncope, waxing and waning but has been improving for last few days -Likely multifactorial including incompletely treated UTI, dehydration and acute kidney injury.   -Patient did not exhibit any focal deficits.   -CT head did not show any focal findings.   -Patient was hospitalized a few days ago for syncope thought to be due to vasovagal.   -Further work-up was pursued.  MRI brain was done yesterday which shows atrophy and findings suggestive of remote infarcts.  No acute infarct identified. -Folic acid B12 level was normal.   -TSH was normal when checked recently. -Continue to treat UTI  -EEG done on 09/06/2020 did raise concern for epileptiform activity in the right temporal area; patient was initiated on loaded with IV Depakote and started on p.o. Depakote however her mentation has significantly worsened and she is more somnolent and difficult to arouse so the Depakote was changed back to IV today and now has been stopped by neurology and changed to Keppra and they are recommending continue Keppra to 50 mg p.o. twice daily -Neurology formally consulted for further evaluation recommendations repeat EEG was reviewed by the neurologist Dr. Otelia Limes and EEG was not consistent with ongoing seizures but did reveal bitemporal slowing and suggestive of bitemporal focal neuronal dysfunction -Palliative care is also following and if neurology no further recommendations and the recommendation would be to transition the patient to comfort care; neurology discharge optimize all her toxic metabolic derangement and changing and reevaluate her EEG however I feel that she is worsening and continues to decline -Neurology agrees with holding her home Keppra and pain medications and avoiding Haldol as well and recommending delirium precautions and check thiamine level and repeating thiamine after checking level -We will give her high-dose IV thiamine after her B1 level is drawn and start 500 mg IV q8h scheduled  for 3 days.  I spoke with neurology today and they recommended resuming thiamine at 100 mg daily po/IV -Patient was much more awake and alert today but still confused a little bit so will obtain SLP evaluation for swallowing; SLP V done and she is cleared for dysphagia 1 diet.  Family thinks she is doing little bit better yesterday however today they think she is a little worse even though she is still awake and responsive she is more somnolent -I am clinically concerned about her oral intake so I ordered a calorie count the day before yesterday.  Per her wishes she did not want any artificial feeding and no PEG tube and will not even attempt to place a Cortrak feeding tube; Follow up on Calorie Count after 48 Hours but today she has not been eating very much and her son describes it as "minimal" but today she has been eating a lot more than she did yesterday.  We will need to continue to monitor her caloric intake but prognosis is still extremely guarded  Concern for Epilepsy, ruled oud -Patient underwent EEG 3/21 which does raise concern for epileptiform activity in the right temporal area.   -Discussed with Dr. Otelia Limes with neurology who recommends loading with Depakote and then starting 3 times  a day dosage with 375 mcg.    Now Depakote has been stopped and changed to IV Keppra without load at 250 mg twice daily; when her oral intake is improved we will switch to p.o. Keppra -Ammonia level normal.  LFTs normals as AST is 12 and ALT is 9 -Check Depakote level during hospitalization and was 52 -Neurology was formally consulted given family request because she has not improved with the Depakote that was initiated and this has been changed to IV Keppra and EEG is being repeated as above -She was placed on a full liquid diet but unfortunately had not been able to eat given her encephalopathy and will need SLP re-evaluation and she is cleared for dysphagia 1 diet  Recently Diagnosed UTI -Urine cultures  growing E. coli.   -Noted to be pansensitive.   -Continue IV ceftriaxone for now.   -Follow-up on blood cultures.  Plan for 5-day course. -We will respect her temperature overnight of 101.3 so we will repeat blood cultures x2 now -WBC trended up to 11.2 and has now Normalized and is 7.3  Acute kidney injury on chronic kidney disease stage IIIb/hypokalemia Metabolic Acidosis  -Likely dehydrated from poor oral intake.   -Baseline creatinine around 1.  Lisinopril is on hold.   -Patient's creatinine has improved with IV hydration.   -Continued gentle IV fluids and fluids had been resumed to D5 normal saline at 75 mL's per hour +20 mEq of KCl but will change to Sodium Bicarbonate gtt in D5W at 75 mL/hr given Acidosis  -Patient has a mild metabolic acidosis and worsened slgihtly as CO2 is 18, anion gap of 11, chloride level of 110; NOW Acidosis is improved as CO2 is 25, chloride level is 110, anion gap is 14 -Patient's oral intake remained poor and she is more awake today so will get SLP re-evaluation -Continue to monitor and trend and avoid nephrotoxic medications, contrast dyes, hypotension and renally dose medications -Repeat CMP in a.m.  Essential Hypertension -Unable to take oral meds due to her alteration in mental status.   -Use hydralazine as needed.   -Blood pressure is reasonably well controlled.  Occasional high readings noted. -Continue to monitor blood pressures per protocol and last blood pressure reading was 116/83  Hypothyroidism -TSH was 2.8 on 3/17 -Changed her p.o. levothyroxine to IV the day before yesterday and will continue for now  Diabetes Mellitus Type 2, non-insulin-dependent -HbA1c 6.6 when checked recently.   -Check glucose levels once a day.  No SSI for Now. -CBG's ranging from 126-192; today it was 148 this morning  Chronic low back pain/lumbar spinal stenosis -Tramadol is being held currently .  PT and OT but she is not awake enough to even participate  today. -Usually gets around in a wheelchair at baseline.  History of peripheral neuropathy -On gabapentin at home which is currently on hold due to her encephalopathy and her inability to swallow  Leukocytosis -Mild -Patient's WBC went from 11.1 -> 7.3 today -Continue to monitor for signs and symptoms of infection she is on IV ceftriaxone for now -Repeat CBC in a.m.   Hypokalemia -Paitent's K+ is now 4.1 -Continue to Monitor and Replete as Necessary -Repeat CMP in the AM   Hypophosphatemia -Patient's Phos Level was 2.0 and is now 2.8 -Continue to Monitor and Replete as Necessary -Repeat Phos Level in the AM   Thrombocytopenia -Patient's platelet count has steadily dropped and went from 231 -> 171 -> 157 -> 149 -> 151 -> 138 ->  140 -> 174 -> 165 and improved -Unclear etiology  -Continue to monitor for signs and symptoms of bleeding; currently no overt bleeding noted -Repeat CBC in a.m.  Goals of Care -Patient is DNR -Prognosis is guarded at this time but she is slowly improving and much more awake today than the last few days.   -Patient has been more awake the last few days than she has been previously -After discussion with patient's son yesterday palliative Care has been consulted and family requesting continuing current plan of care with pending neurology input.  If no further treatment options are available they would focus on the patient's comfort and symptom management allow her to be at peace with what time she is left; Continue current care and Neuro feels she is not having seizures but recommending Keppra 250 mg BID; She is improved and awoke today but if she declines and feels to continue to eat and does not improve she would be Hospice appropriate  -Likely the patient would NOT be able to return to her assisted living at [place]; Will need PT/OT to re-evaluate but will follow up on her calorie count  DVT prophylaxis: Enoxaparin 30 mg sq q24h Code Status: DO NOT  RESUSCITATE Family Communication: Spoke to her Son at bedside  Disposition Plan: Pending further clinical improvement; patient has a very high risk for decompensation given her worsening status and palliative care is involved in goals of care discussion.  Neurology to weigh in and if they have no further recommendations patient is appropriate to be transition to hospice care given her advanced age, frail status and comorbidities however she is slowly improving and family think she is much more lucid today so we will continue to watch and monitor   Status is: Inpatient  Remains inpatient appropriate because:Unsafe d/c plan, IV treatments appropriate due to intensity of illness or inability to take PO and Inpatient level of care appropriate due to severity of illness   Dispo:  Patient From: Assisted Living Facility  Planned Disposition: Skilled Nursing Facility  Medically stable for discharge: No    Consultants:   Palliative Care   Neurology   Procedures:  EEG  This is an abnormal waking and drowsy study due to 1) attenuated organization as described above suggestive of a mild diffuse encephalopathy, with 2) superimposed focal neuronal dysfunction in the right greater than left bitemporal region, and 3) occasional, isolated right temporal discharges suggestive of an epileptiform foci in this region.  There was no evidence of seizure activity however.  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    Antimicrobials:  Anti-infectives (From admission, onward)   Start     Dose/Rate Route Frequency Ordered Stop   09/05/20 2100  cefTRIAXone (ROCEPHIN) 1 g in sodium chloride 0.9 % 100 mL IVPB        1 g 200 mL/hr over 30 Minutes Intravenous Every 24 hours 09/04/20 2334 09/10/20 2208   09/04/20 2045  cefTRIAXone (ROCEPHIN) 1 g  in sodium chloride 0.9 % 100 mL IVPB        1 g 200 mL/hr over 30 Minutes Intravenous  Once 09/04/20 2031 09/05/20 0027        Subjective: Seen and examined at bedside and apparently she is much more awake and alert this morning and wanted to rest now.  Son states that this morning she woke up around 9:00 ate much more than she did yesterday and then wanted to rest and fell back asleep.  I walked in the room I woke her up and she denied any pain and states that she is not hungry currently and wanted to sleep.  No other concerns or complaints at this time.  Objective: Vitals:   09/12/20 1257 09/12/20 1259 09/12/20 2047 09/13/20 0413  BP: (!) 149/72  100/84 116/83  Pulse: 80 85 94 90  Resp: 17  20 19   Temp: 98.1 F (36.7 C)  98.9 F (37.2 C) 97.8 F (36.6 C)  TempSrc: Oral   Oral  SpO2:  99% 97% 97%  Weight:    59 kg  Height:        Intake/Output Summary (Last 24 hours) at 09/13/2020 1221 Last data filed at 09/13/2020 1100 Gross per 24 hour  Intake 2332.55 ml  Output 2175 ml  Net 157.55 ml   Filed Weights   09/04/20 1806 09/05/20 0124 09/13/20 0413  Weight: 58.5 kg 57.1 kg 59 kg   Examination: Physical Exam:  Constitutional: The patient is a thin elderly chronically ill-appearing Caucasian female who is resting today but easily awoken.  She still very pleasantly demented and not as agitated Eyes: Lids and conjunctivae normal, sclerae anicteric  ENMT: External Ears, Nose appear normal.  Somewhat hard of  hearing and has better hearing on the right compared to the left Neck: Appears normal, supple, no cervical masses, normal ROM, no appreciable thyromegaly Respiratory: Diminished to auscultation bilaterally, no wheezing, rales, rhonchi or crackles. Normal respiratory effort and patient is not tachypenic. No accessory muscle use.  Cardiovascular: RRR, no murmurs / rubs / gallops. S1 and S2 auscultated.  Very minimal edema but has slightly worse on the right compared to left Abdomen: Soft, non-tender, non-distended. Bowel sounds positive.  GU: Deferred. Musculoskeletal: No clubbing / cyanosis of digits/nails. No joint deformity upper and lower extremities.  Skin: No rashes, lesions, ulcers or limited skin evaluation. No induration; Warm and dry.  Neurologic: CN 2-12 grossly intact with no focal deficits. Romberg sign cerebellar reflexes not assessed.  Psychiatric: Norm and impaired al judgment and insight.  She is somnolent and a little drowsy sleeping well and she is fully awoken she is alert and oriented x2. Normal mood and appropriate affect.   Data Reviewed: I have personally reviewed following labs and imaging studies  CBC: Recent Labs  Lab 09/09/20 0505 09/10/20 0750 09/11/20 0558 09/12/20 0453 09/13/20 0452  WBC 10.9* 10.3 9.8 7.8 7.3  NEUTROABS 7.4 7.6 6.6 5.2 4.1  HGB 11.9* 12.6 12.0 10.8* 10.1*  HCT 37.9 40.4 37.8 34.7* 32.9*  MCV 95.0 96.9 95.7 96.4 97.6  PLT 151 138* 140* 174 165   Basic Metabolic Panel: Recent Labs  Lab 09/09/20 0505 09/10/20 0750 09/11/20 0558 09/12/20 0453 09/13/20 0452  NA 141 139 139 139 141  K 3.6 3.2* 4.3 4.0 4.1  CL 111 110 105 108 110  CO2 21* 18* 24 23 25   GLUCOSE 178* 186* 192* 164* 148*  BUN 22 15 13 13 14   CREATININE 0.92 0.63 0.81  0.79 0.78  CALCIUM 8.8* 8.9 8.9 8.5* 8.6*  MG 2.0 1.9 1.8 2.3 1.9  PHOS 2.5 2.0* 2.7 2.9 2.8   GFR: Estimated Creatinine Clearance: 33.3 mL/min (by C-G formula based on SCr of 0.78 mg/dL). Liver  Function Tests: Recent Labs  Lab 09/09/20 0505 09/10/20 0750 09/11/20 0558 09/12/20 0453 09/13/20 0452  AST 13* 13* 16 12* 13*  ALT 10 10 10 9 10   ALKPHOS 59 61 57 55 52  BILITOT 0.5 0.6 0.8 0.6 0.4  PROT 6.4* 6.7 6.2* 5.6* 5.4*  ALBUMIN 3.0* 3.0* 2.8* 2.6* 2.5*   No results for input(s): LIPASE, AMYLASE in the last 168 hours. Recent Labs  Lab 09/07/20 0925  AMMONIA 12   Coagulation Profile: No results for input(s): INR, PROTIME in the last 168 hours. Cardiac Enzymes: No results for input(s): CKTOTAL, CKMB, CKMBINDEX, TROPONINI in the last 168 hours. BNP (last 3 results) No results for input(s): PROBNP in the last 8760 hours. HbA1C: No results for input(s): HGBA1C in the last 72 hours. CBG: No results for input(s): GLUCAP in the last 168 hours. Lipid Profile: No results for input(s): CHOL, HDL, LDLCALC, TRIG, CHOLHDL, LDLDIRECT in the last 72 hours. Thyroid Function Tests: No results for input(s): TSH, T4TOTAL, FREET4, T3FREE, THYROIDAB in the last 72 hours. Anemia Panel: No results for input(s): VITAMINB12, FOLATE, FERRITIN, TIBC, IRON, RETICCTPCT in the last 72 hours. Sepsis Labs: No results for input(s): PROCALCITON, LATICACIDVEN in the last 168 hours.  Recent Results (from the past 240 hour(s))  Urine Culture     Status: Abnormal   Collection Time: 09/04/20  6:51 PM   Specimen: Urine, Random  Result Value Ref Range Status   Specimen Description   Final    URINE, RANDOM Performed at [facility], [address], [place], [place]    Special Requests   Final    NONE Performed at [facility], [address], [place], [place]    Culture >=100,000 COLONIES/mL ESCHERICHIA COLI (A)  Final   Report Status 09/07/2020 FINAL  Final   Organism ID, Bacteria ESCHERICHIA COLI (A)  Final      Susceptibility   Escherichia coli - MIC*    AMPICILLIN 8 SENSITIVE Sensitive     CEFAZOLIN <=4 SENSITIVE Sensitive      CEFEPIME <=0.12 SENSITIVE Sensitive     CEFTRIAXONE <=0.25 SENSITIVE Sensitive     CIPROFLOXACIN <=0.25 SENSITIVE Sensitive     GENTAMICIN <=1 SENSITIVE Sensitive     IMIPENEM <=0.25 SENSITIVE Sensitive     NITROFURANTOIN <=16 SENSITIVE Sensitive     TRIMETH/SULFA <=20 SENSITIVE Sensitive     AMPICILLIN/SULBACTAM 4 SENSITIVE Sensitive     PIP/TAZO <=4 SENSITIVE Sensitive     * >=100,000 COLONIES/mL ESCHERICHIA COLI  Resp Panel by RT-PCR (Flu A&B, Covid) Nasopharyngeal Swab     Status: None   Collection Time: 09/04/20  9:25 PM   Specimen: Nasopharyngeal Swab; Nasopharyngeal(NP) swabs in vial transport medium  Result Value Ref Range Status   SARS Coronavirus 2 by RT PCR NEGATIVE NEGATIVE Final    Comment: (NOTE) SARS-CoV-2 target nucleic acids are NOT DETECTED.  The SARS-CoV-2 RNA is generally detectable in upper respiratory specimens during the acute phase of infection. The lowest concentration of SARS-CoV-2 viral copies this assay can detect is 138 copies/mL. A negative result does not preclude SARS-Cov-2 infection and should not be used as the sole basis for treatment or other patient management decisions. A negative result may occur  with  improper specimen collection/handling, submission of specimen other than nasopharyngeal swab, presence of viral mutation(s) within the areas targeted by this assay, and inadequate number of viral copies(<138 copies/mL). A negative result must be combined with clinical observations, patient history, and epidemiological information. The expected result is Negative.  Fact Sheet for Patients:  BloggerCourse.comhttps://www.fda.gov/media/152166/download  Fact Sheet for Healthcare Providers:  SeriousBroker.ithttps://www.fda.gov/media/152162/download  This test is no t yet approved or cleared by the [place] FDA and  has been authorized for detection and/or diagnosis of SARS-CoV-2 by FDA under an Emergency Use Authorization (EUA). This EUA will remain  in effect (meaning this  test can be used) for the duration of the COVID-19 declaration under Section 564(b)(1) of the Act, 21 U.S.C.section 360bbb-3(b)(1), unless the authorization is terminated  or revoked sooner.       Influenza A by PCR NEGATIVE NEGATIVE Final   Influenza B by PCR NEGATIVE NEGATIVE Final    Comment: (NOTE) The Xpert Xpress SARS-CoV-2/FLU/RSV plus assay is intended as an aid in the diagnosis of influenza from Nasopharyngeal swab specimens and should not be used as a sole basis for treatment. Nasal washings and aspirates are unacceptable for Xpert Xpress SARS-CoV-2/FLU/RSV testing.  Fact Sheet for Patients: BloggerCourse.comhttps://www.fda.gov/media/152166/download  Fact Sheet for Healthcare Providers: SeriousBroker.ithttps://www.fda.gov/media/152162/download  This test is not yet approved or cleared by the [place] FDA and has been authorized for detection and/or diagnosis of SARS-CoV-2 by FDA under an Emergency Use Authorization (EUA). This EUA will remain in effect (meaning this test can be used) for the duration of the COVID-19 declaration under Section 564(b)(1) of the Act, 21 U.S.C. section 360bbb-3(b)(1), unless the authorization is terminated or revoked.  Performed at [facility], [address] [place], [place] [address]   Blood culture (routine x 2)     Status: None   Collection Time: 09/04/20  9:40 PM   Specimen: BLOOD  Result Value Ref Range Status   Specimen Description   Final    BLOOD SITE NOT SPECIFIED Performed at [facility], [address] [place], [place] [address]    Special Requests   Final    BOTTLES DRAWN AEROBIC AND ANAEROBIC Blood Culture adequate volume Performed at [facility], [address] [place], [place] [address]    Culture   Final    NO GROWTH 5 DAYS Performed at [facility], [address] [place], [place] [address]    Report Status 09/10/2020 FINAL  Final  Culture, blood (routine x 2)     Status: None  (Preliminary result)   Collection Time: 09/09/20 10:08 AM   Specimen: BLOOD  Result Value Ref Range Status   Specimen Description   Final    BLOOD BLOOD LEFT FOREARM Performed at [facility], [address] [place], [place] [address]    Special Requests   Final    BOTTLES DRAWN AEROBIC AND ANAEROBIC Blood Culture adequate volume Performed at [facility], [address] [place], [place] [address]    Culture   Final    NO GROWTH 4 DAYS Performed at [facility], [address] [place], [place] [address]    Report Status PENDING  Incomplete  Culture, blood (routine x 2)     Status: None (Preliminary result)   Collection Time: 09/09/20 10:10 AM   Specimen: BLOOD  Result Value Ref Range Status   Specimen Description   Final    BLOOD LEFT WRIST Performed  at [facility], [address] [place], [place] [address]    Special Requests   Final    BOTTLES DRAWN AEROBIC AND ANAEROBIC Blood Culture adequate volume Performed at [facility], [address], [place], [place] [address]    Culture   Final    NO GROWTH 4 DAYS Performed at [facility], [address] [place], [place] [address]    Report Status PENDING  Incomplete    RN Pressure Injury Documentation:    Estimated body mass index is 23.04 kg/m as calculated from the following:   Height as of this encounter:  (1.6 m).   Weight as of this encounter: 59 kg.  Malnutrition Type:  Nutrition Problem: Inadequate oral intake Etiology: acute illness,lethargy/confusion  Malnutrition Characteristics:  Signs/Symptoms: per patient/family report (son reports poor intake x 3 days, per MD- UTI, dehydration, AKI on admission)  Nutrition Interventions:  Interventions: Ensure Enlive (each supplement provides 350kcal and 20 grams of protein),Magic cup,Liberalize Diet   Radiology Studies: No results found. Scheduled Meds: . enoxaparin  (LOVENOX) injection  30 mg Subcutaneous Q24H  . feeding supplement  237 mL Oral BID BM  . levothyroxine  37.5 mcg Intravenous Daily  . multivitamin with minerals  1 tablet Oral Daily  . polyvinyl alcohol  2 drop Both Eyes TID   Continuous Infusions: . dextrose 5 % and 0.9 % NaCl with KCl 20 mEq/L 75 mL/hr at 09/12/20 1835  . levETIRAcetam 250 mg (09/13/20 0948)    LOS: 8 days   Merlene Laughter, DO [facility] PAGER is on AMION  If 7PM-7AM, please contact night-coverage [email or web address]

## 2020-09-14 DIAGNOSIS — L8990 Pressure ulcer of unspecified site, unspecified stage: Secondary | ICD-10-CM | POA: Insufficient documentation

## 2020-09-14 DIAGNOSIS — R52 Pain, unspecified: Secondary | ICD-10-CM

## 2020-09-14 LAB — CULTURE, BLOOD (ROUTINE X 2)
Culture: NO GROWTH
Culture: NO GROWTH
Special Requests: ADEQUATE
Special Requests: ADEQUATE

## 2020-09-14 MED ORDER — LEVOTHYROXINE SODIUM 75 MCG PO TABS
75.0000 ug | ORAL_TABLET | Freq: Every day | ORAL | Status: DC
  Administered 2020-09-15 – 2020-09-16 (×2): 75 ug via ORAL
  Filled 2020-09-14 (×2): qty 1

## 2020-09-14 MED ORDER — VITAMIN D 25 MCG (1000 UNIT) PO TABS
2000.0000 [IU] | ORAL_TABLET | Freq: Every morning | ORAL | Status: DC
  Administered 2020-09-15: 2000 [IU] via ORAL
  Filled 2020-09-14: qty 2

## 2020-09-14 MED ORDER — PROPRANOLOL HCL 20 MG PO TABS
20.0000 mg | ORAL_TABLET | Freq: Every day | ORAL | Status: DC
  Administered 2020-09-14 – 2020-09-16 (×3): 20 mg via ORAL
  Filled 2020-09-14 (×3): qty 1

## 2020-09-14 MED ORDER — GABAPENTIN 300 MG PO CAPS
300.0000 mg | ORAL_CAPSULE | Freq: Two times a day (BID) | ORAL | Status: DC
  Administered 2020-09-14 – 2020-09-16 (×5): 300 mg via ORAL
  Filled 2020-09-14 (×5): qty 1

## 2020-09-14 MED ORDER — BOOST / RESOURCE BREEZE PO LIQD CUSTOM
1.0000 | Freq: Three times a day (TID) | ORAL | Status: DC
  Administered 2020-09-14 – 2020-09-16 (×6): 1 via ORAL

## 2020-09-14 MED ORDER — ASCORBIC ACID 500 MG PO TABS
500.0000 mg | ORAL_TABLET | Freq: Every day | ORAL | Status: DC
  Administered 2020-09-14 – 2020-09-15 (×2): 500 mg via ORAL
  Filled 2020-09-14 (×2): qty 1

## 2020-09-14 MED ORDER — ENOXAPARIN SODIUM 30 MG/0.3ML ~~LOC~~ SOLN
30.0000 mg | SUBCUTANEOUS | Status: DC
  Administered 2020-09-14 – 2020-09-15 (×2): 30 mg via SUBCUTANEOUS
  Filled 2020-09-14 (×2): qty 0.3

## 2020-09-14 NOTE — Progress Notes (Addendum)
Patient ID: Lori Ellis, female   DOB: 04/08/1921, 87 y.o.   MRN: [id number]  Medical records reviewed   87 y.o.femalewith past medical history oftype 2 diabetes, peripheral neuropathy, chronic pain, hypothyroidism, hypertension, and chronic kidney disease stage III. Patient presented to the ED from [facility] facility with concerns for altered mental status. Patient was recently hospitalized for syncopal episodes and also treated for UTI (discharged on antibiotics). CT of head showed no acute abnormalities. Chest x-ray unremarkable. 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  Patient has continued to fail to thrive during this hospital stay in spite of full medical support.  Poor po intake and mentation waxes and wanes.   Yesterday I met with the patient's 2 sons (Lori Ellis and Lori Ellis) for detailed education regarding patient's current medical situation, discussion/education regarding goals of care, end-of-life wishes, disposition and options was had.  Decision was made for the focus of care to be on comfort and dignity, allowing for a natural death.  Patient has documented declaration of a natural death.     I was made aware by the attending/ Dr Sheikh and nursing that there was some question around treatment plan specific to symptom management and utilization of low-dose Roxanol for pain and dyspnea.  Today Lori Ellis is lethargic, she continues with poor p.o. intake.  She intermittently communicates some level of discomfort; furrowed brow, moans and statements of " help me".   I met again with her 2 sons (Lori Ellis and Lori Ellis) as scheduled for clarification and  conversation around transition of care and treatment plan into the next days and weeks for Lori Ellis.  To ensure clarification of plan of care I reviewed yesterday's conversation and outlined plan of care as discussed, including utilization of medications for symptom management specific to pain, dyspnea and  agitation.  Again the difference between and aggressive medical intervention path and a pallitaive comfort path for this patient at this time in this situation was detailed.  Both sons verbalized an understanding of the patient's current situation, her overall failure to thrive and her likely limited prognosis.  Focus is comfort and dignity, allowing for a natural death.   Plan of Care: -DNR/DNI -no artifical feeding or hydration now or in the future        - diet as toelrated -minimize medication to only those that enhance comfort   -Roxanol-for underlying pain and dyspnea    -Haldol-for agitation Both sons verbalized an understanding of the utilization of these drugs and are agreement to the utilization of these drugs to treat Lori Ellis symptoms in order to enhance her comfort.  They verbalize an understanding of possible side effects.  -no further diagnostics - monitor for transitions of care - plan to meet again tomorrow at 1:30 pm for ongoing discussion regarding care plan  As witnessed by bedside RN, and student nurse both sons verbalized support of the plan as outlined above.   Detailed education explaining hospice benefit and the difference between hospice services in the home, in a skilled nursing facility and in a residential facility.  Questions and concerns addressed   Discussed with Dr Sheikh and bedside RN  Total time spent on the unit was 60 minutes  Greater than 50% of the time was spent in counseling and coordination of care    NP  Palliative Medicine Team Team Phone # [phone] Pager [phone]   

## 2020-09-14 NOTE — Progress Notes (Addendum)
PROGRESS NOTE    Lori Ellis  PYP:[id number] DOB: December 22, 1920 DOA: 09/04/2020 PCP: Truett Perna, MD   Brief Narrative:  The patient is a 87 year old female with past medical history significant for but not limited to essential hypertension, type 2 diabetes mellitus, peripheral neuropathy, history of spinal stenosis and chronic pain, hypothyroidism, vitamin D deficiency as well as other comorbidities who presented to the emergency room on 09/04/2020 with worsening confusion.  She currently lives in assisted living facility is recently hospitalized for syncopal episode and found to have urinary tract infection at that time of discharged on cefdinir.  She is brought back into the hospital by her son due to worsening mentation.  UA was noted to be abnormal.  She was continued on ceftriaxone.  MRI brain was obtained given her encephalopathy and did not show any acute infarct but did show atrophy and chronic small vessel disease and chronic infarcts noted.  EEG was done and was suggestive of epileptiform activity in the right temporal lobe.  Patient was less responsive yesterday and has further declined today and palliative recommending transitioning to focus to a more comfort approach.  Neurology to evaluate prior to the family make any final decisions about transition to comfort care.  Neurology changed the patient's antiepileptic from Depakote to Keppra and have ordered a repeat EEG they are recommending delirium precautions and avoiding Haldol and checking a thiamine level and repeating thiamine after level has been checked.  Dr. Wilford Corner also recommended changing antibiotics to a non-cephalosporin; overnight she did spike a temperature of 101.3 and blood cultures have been ordered however she is more somnolent and even less responsive today compared to yesterday.  Neurology will follow up on the EEG and if all the toxic metabolic derangements are optimized and she continues not improve then neurology recommends  asked her to consider comfort measures only however she is improving now and she is more awake and alert today than she was yesterday and she has been laughing and cracking jokes with her sons.  She is still altered but more awake and alert and will repeat her SLP.  Repeat EEG showed bitemporal slowing suggestive of bitemporal focal neuronal dysfunction but was not consistent with ongoing seizures.  Neurology recommended continuing low-dose Keppra 250 mg p.o. twice daily  Repeat speech evaluation showed that she was cleared for dysphagia 1 diet with thin liquids.  She is still awake today but not as alert as yesterday.  Her appetite has not been very good today and so we have started a calorie count.  She has made her wishes clear and she does not want artificial feeding.  If patient is unable to maintain her oral nutrition and caloric intake we will need to discuss with the family about transitioning goals of care to comfort.  Patient is more awake on 09/12/2020 and does interact but was agitated and per son has been eating very minimally.  We will continue her current calorie count and will need to continue to monitor intake.  If she is not able to maintain her oral intake will likely need to be transitioned to comfort care given her wishes.  Yesterday on 09/13/2020 her son states that she has been the most lucid she has been since he seen her last few days.  She is much more receptive to food and ate more today than she did yesterday.  She fell back asleep later on in the morning.  Calorie count is still ongoing and discussed with nutrition to  keep it going until we figure out what her true nutritional status is.  She remains awake and alert but intake appears to be minimal.  Palliative care consulted for continued goals of care discussion and family is resistant to transition to residential hospice and wants to continue watchful waiting to see how she does with her oral intake.   Assessment & Plan:    Active Problems:   Hypothyroidism   Type II diabetes mellitus with renal manifestations (HCC)   Benign essential HTN   Acute lower UTI   Metabolic encephalopathy   AKI (acute kidney injury) (HCC)   Acute metabolic encephalopathy   Pressure injury of skin  Acute Metabolic Ecephalopathy/recent vasovagal syncope, waxing and waning but has been improving for last few days and relatively stable for now -Likely multifactorial including incompletely treated UTI, dehydration and acute kidney injury.   -Patient did not exhibit any focal deficits.   -CT head did not show any focal findings.   -Patient was hospitalized a few days ago for syncope thought to be due to vasovagal.   -Further work-up was pursued.  MRI brain was done yesterday which shows atrophy and findings suggestive of remote infarcts.  No acute infarct identified. -Folic acid B12 level was normal.   -TSH was normal when checked recently. -Continue to treat UTI  -EEG done on 09/06/2020 did raise concern for epileptiform activity in the right temporal area; patient was initiated on loaded with IV Depakote and started on p.o. Depakote however her mentation has significantly worsened and she is more somnolent and difficult to arouse so the Depakote was changed back to IV today and now has been stopped by neurology and changed to Keppra and they are recommending continue Keppra to 50 mg p.o. twice daily -Neurology formally consulted for further evaluation recommendations repeat EEG was reviewed by the neurologist Dr. Otelia Limes and EEG was not consistent with ongoing seizures but did reveal bitemporal slowing and suggestive of bitemporal focal neuronal dysfunction -Palliative care is also following and if neurology no further recommendations and the recommendation would be to transition the patient to comfort care; neurology recommended to optimize all her toxic metabolic derangements and changing and reevaluate her EEG however I feel that she is  worsening and continues to decline -Neurology agrees with holding her home Keppra and pain medications and avoiding Haldol as well and recommending delirium precautions and check thiamine level and repeating thiamine after checking level -We will give her high-dose IV thiamine after her B1 level is drawn and start 500 mg IV q8h scheduled for 3 days.  I spoke with neurology today and they recommended resuming thiamine at 100 mg daily po/IV -Patient was much more awake and alert today but still confused a little bit so will obtain SLP evaluation for swallowing; SLP V done and she is cleared for dysphagia 1 diet.  Family feels that they do not want to decide about hospice and want to see how much she will actually eat prior to making that decision -I am clinically concerned about her oral intake so I ordered a calorie count for her.  Per her wishes she did not want any artificial feeding and no PEG tube and will not even attempt to place a Cortrak feeding tube; Follow up on Calorie Count after 48 Hours but today she has not been eating very much and her son describes it improving some but was not as good as yesterday.  We will need to continue to monitor her caloric intake  but prognosis is still extremely guarded; palliative involved in continued goals of care conversation -The dietitian has added Ensure Enlive twice daily and Magic cup 3 times daily as well as boost breeze 3 times daily  Concern for Epilepsy, ruled oud -Patient underwent EEG 3/21 which does raise concern for epileptiform activity in the right temporal area.   -Discussed with Dr. Otelia Limes with neurology who recommends loading with Depakote and then starting 3 times a day dosage with 375 mcg.  Depakote had been switched to IV Keppra 250 mg twice daily which has now been changed to p.o. -Ammonia level normal.  LFTs normals as AST is 12 and ALT is 9 -Check Depakote level during hospitalization and was 60 -Neurology was formally consulted given  family request because she has not improved with the Depakote that was initiated and this has been changed to IV Keppra and EEG is being repeated as above -She was placed on a full liquid diet but unfortunately had not been able to eat given her encephalopathy and will need SLP re-evaluation and she is cleared for dysphagia 1 diet and will continue current regimen  Recently Diagnosed UTI -Urine cultures growing E. coli.   -Noted to be pansensitive.   -Continue IV ceftriaxone for now.   -Follow-up on blood cultures.  Plan for 5-day course. -We will respect her temperature overnight of 101.3 so we will repeat blood cultures on 09/09/2020 showed no growth to date at 5 days -WBC trended up to 11.2 and has now Normalized and is 7.3 on last check yesterday and will repeat tomorrow   Acute kidney injury on chronic kidney disease stage IIIb/hypokalemia, improved Metabolic Acidosis  -Likely dehydrated from poor oral intake.   -Baseline creatinine around 1.  Lisinopril is on hold.   -Patient's creatinine has improved with IV hydration.  Now gentle IV fluid nutrition has now been discontinued -Patient has a mild metabolic acidosis and worsened slgihtly as CO2 is 18, anion gap of 11, chloride level of 110; NOW Acidosis is improved as CO2 is 25, chloride level is 110, anion gap is 14 on last check on 09/05/2020 -Patient's oral intake remained poor and she is more awake today so will get SLP re-evaluation -Continue to monitor and trend and avoid nephrotoxic medications, contrast dyes, hypotension and renally dose medications -Repeat CMP in a.m.  Essential Hypertension -Unable to take oral meds due to her alteration in mental status.   -Use hydralazine as needed.   -Blood pressure is reasonably well controlled.  Occasional high readings noted. -Resume Inderal for now; Continue to Hold Amlodipine and Lisionopril  -Continue to monitor blood pressures per protocol and last blood pressure reading was  134/71  Hypothyroidism -TSH was 2.8 on 3/17 -Resumed home Levothyroxine now that she is more awake  Diabetes Mellitus Type 2, non-insulin-dependent -HbA1c 6.6 when checked recently.   -Check glucose levels once a day.  No SSI for Now. -CBG's ranging from 126-192; today it was 148 yesterday morning  Chronic low back pain/lumbar spinal stenosis -Tramadol is being held currently and Palliative has started SL Morphine.  PT and OT to evaluate and treat and will reconsult them -Usually gets around in a wheelchair at baseline.  History of peripheral neuropathy -On gabapentin at home which is currently on hold due to her encephalopathy but since she is improving will resume   Leukocytosis -Mild -Patient's WBC went from 11.1 -> 7.3 on last check yesterday  -Continue to monitor for signs and symptoms of infection she is  on IV ceftriaxone for now -Repeat CBC in a.m.   Hypokalemia -Paitent's K+ is now 4.1 yesterday  -Continue to Monitor and Replete as Necessary -Repeat CMP in the AM   Hypophosphatemia -Patient's Phos Level was 2.0 and is now 2.8 on last check yesterday  -Continue to Monitor and Replete as Necessary -Repeat Phos Level in the AM   Thrombocytopenia -Patient's platelet count has steadily dropped and went from 231 -> 171 -> 157 -> 149 -> 151 -> 138 -> 140 -> 174 -> 165 and improved -Unclear etiology  -Continue to monitor for signs and symptoms of bleeding; currently no overt bleeding noted -Repeat CBC in a.m.  Goals of Care -Patient is DNR -Prognosis is guarded at this time but she is slowly improving with her mentation and much more awake today than the last few days.   -After discussion with patient's son, palliative Care has been consulted and family requesting continuing current plan of care with pending neurology input.  If no further treatment options are available they would focus on the patient's comfort and symptom management allow her to be at peace with what  time she is left but now that she has improved with her mentation they are resistant to Transitioning to Hospice yet and want watchful waiting; Continue current care and Neuro feels she is not having seizures but recommending Keppra 250 mg BID;  -She is improved and awoke today but oral intake is still not too great. if she declines and feels to continue to eat and does not improve she would be Hospice appropriate but family is currently wanting to see how she does prior to making a Decision. Son was very upset that patient got SL Morphine today -Likely the patient would NOT be able to return to her assisted living at [place]; Will need PT/OT to re-evaluate but will follow up on her calorie count and continue it  -Palliative Discussions ongoing regarding care plan and patient to have Diet as tolerated and  DVT prophylaxis: Enoxaparin 30 mg sq q24h Code Status: DO NOT RESUSCITATE Family Communication: Spoke to her Son [name] at bedside  Disposition Plan: Pending further clinical improvement; patient has a very high risk for decompensation given her worsening status and palliative care is involved in goals of care discussion.  Neurology to weigh in and if they have no further recommendations patient is appropriate to be transition to hospice care given her advanced age, frail status and comorbidities however she is slowly improving and family think she is much more lucid but oral intake is still questionable so will need to continue to Monitor   Status is: Inpatient  Remains inpatient appropriate because:Unsafe d/c plan, IV treatments appropriate due to intensity of illness or inability to take PO and Inpatient level of care appropriate due to severity of illness   Dispo:  Patient From: Assisted Living Facility  Planned Disposition: Skilled Nursing Facility  Medically stable for discharge: No    Consultants:   Palliative Care   Neurology   Procedures:  EEG  This is an abnormal waking  and drowsy study due to 1) attenuated organization as described above suggestive of a mild diffuse encephalopathy, with 2) superimposed focal neuronal dysfunction in the right greater than left bitemporal region, and 3) occasional, isolated right temporal discharges suggestive of an epileptiform foci in this region.  There was no evidence of seizure activity however.  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    Antimicrobials:  Anti-infectives (From admission, onward)   Start     Dose/Rate Route Frequency Ordered Stop   09/05/20 2100  cefTRIAXone (ROCEPHIN) 1 g in sodium chloride 0.9 % 100 mL IVPB        1 g 200 mL/hr over 30 Minutes Intravenous Every 24 hours 09/04/20 2334 09/10/20 2208   09/04/20 2045  cefTRIAXone (ROCEPHIN) 1 g in sodium chloride 0.9 % 100 mL IVPB        1 g 200 mL/hr over 30 Minutes Intravenous  Once 09/04/20 2031 09/05/20 0027        Subjective: Seen and examined at bedside he is awake and alert and was complaining of some diffuse pain everywhere.  No nausea or vomiting or chest pain.  Son states that her intake was not as good today but will try again this afternoon.  Son was extremely upset that the patient received sublingual morphine this morning started by palliative care even though after discussion yesterday they wanted to  minimize medication to only those that enhanced comfort.  Family is now resistant to transitioning to hospice and wants watchful waiting to see how she does and improves with eating.  Will be continue ongoing palliative care discussions.  Objective: Vitals:   09/13/20 2018 09/14/20 0539 09/14/20 0753 09/14/20 1042  BP: 139/63 128/60 133/64 134/71  Pulse: 91 99 (!) 105 100  Resp: (!) 24 (!) 24 (!) 23 (!) 22  Temp: 99.1 F (37.3 C) 98.1 F (36.7 C) 98.9 F (37.2 C)   TempSrc: Oral Oral Oral   SpO2: 96% 95% 97% 100%  Weight:      Height:        Intake/Output Summary (Last 24 hours) at 09/14/2020 1531 Last data filed at 09/14/2020 1100 Gross per 24 hour  Intake 500 ml  Output 1350 ml  Net -850 ml   Filed Weights   09/04/20 1806 09/05/20 0124 09/13/20 0413  Weight: 58.5 kg 57.1 kg 59 kg   Examination: Physical Exam:  Constitutional: The patient is a thin elderly chronically ill-appearing Caucasian female who is more awake and alert today.  Still very pleasant demented and complaining of some diffuse pain Eyes: Lids and conjunctivae normal, sclerae anicteric  ENMT: External Ears, Nose appear normal.  Hearing and has better hearing on the right compared to left Neck: Appears normal, supple, no cervical masses, normal ROM, no appreciable thyromegaly; no appreciable JVD Respiratory: Diminished to auscultation bilaterally, no wheezing, rales, rhonchi or crackles. Normal respiratory effort and patient is not tachypenic. No accessory muscle use.  Unlabored breathing Cardiovascular: RRR, no murmurs / rubs / gallops. S1 and S2 auscultated.  Very slight lower extremity edema slightly worse on the right compared to left Abdomen: Soft, non-tender, non-distended.  Bowel sounds positive.  GU: Deferred. Musculoskeletal: No clubbing / cyanosis of digits/nails. No joint deformity upper and lower extremities. Skin: No rashes, lesions, ulcers on limited skin evaluation. No induration; Warm and dry.   Neurologic: CN 2-12 grossly intact with no focal deficits. Romberg  sign and cerebellar reflexes not assessed.  Psychiatric: Impaired judgment and insight. Awake and Alert and oriented x 2. Normal mood and appropriate affect.   Data Reviewed: I have personally reviewed following labs and imaging studies  CBC: Recent Labs  Lab 09/09/20 0505 09/10/20 0750 09/11/20 0558 09/12/20 0453 09/13/20 0452  WBC 10.9* 10.3 9.8 7.8 7.3  NEUTROABS 7.4 7.6 6.6 5.2 4.1  HGB 11.9* 12.6 12.0 10.8* 10.1*  HCT 37.9 40.4 37.8 34.7* 32.9*  MCV 95.0 96.9 95.7 96.4 97.6  PLT 151 138* 140* 174 165   Basic Metabolic Panel: Recent Labs  Lab 09/09/20 0505 09/10/20 0750 09/11/20 0558 09/12/20 0453 09/13/20 0452  NA 141 139 139 139 141  K 3.6 3.2* 4.3 4.0 4.1  CL 111 110 105 108 110  CO2 21* 18* GLUCOSE 178* 186* 192* 164* 148*  BUN CREATININE 0.92 0.63 0.81 0.79 0.78  CALCIUM 8.8* 8.9 8.9 8.5* 8.6*  MG 2.0 1.9 1.8 2.3 1.9  PHOS 2.5 2.0* 2.7 2.9 2.8   GFR: Estimated Creatinine Clearance: 33.3 mL/min (by C-G formula based on SCr of 0.78 mg/dL). Liver Function Tests: Recent Labs  Lab 09/09/20 0505 09/10/20 0750 09/11/20 0558 09/12/20 0453 09/13/20 0452  AST 13* 13* 16 12* 13*  ALT ALKPHOS 59 61 57 55 52  BILITOT 0.5 0.6 0.8 0.6 0.4  PROT 6.4* 6.7 6.2* 5.6* 5.4*  ALBUMIN 3.0* 3.0* 2.8* 2.6* 2.5*   No results for input(s): LIPASE, AMYLASE in the last 168 hours. No results for input(s): AMMONIA in the last 168 hours. Coagulation Profile: No results for input(s): INR, PROTIME in the last 168 hours. Cardiac Enzymes: No results for input(s): CKTOTAL, CKMB, CKMBINDEX, TROPONINI in the last 168 hours. BNP (last 3 results) No results for input(s): PROBNP in the last 8760 hours. HbA1C: No results for input(s): HGBA1C in the last 72 hours. CBG: No results for input(s): GLUCAP in the last 168 hours. Lipid Profile: No results for input(s): CHOL, HDL,  LDLCALC, TRIG, CHOLHDL, LDLDIRECT in the last 72 hours. Thyroid Function Tests: No results for input(s): TSH, T4TOTAL, FREET4, T3FREE, THYROIDAB in the last 72 hours. Anemia Panel: No results for input(s): VITAMINB12, FOLATE, FERRITIN, TIBC, IRON, RETICCTPCT in the last 72 hours. Sepsis Labs: No results for input(s): PROCALCITON, LATICACIDVEN in the last 168 hours.  Recent Results (from the past 240 hour(s))  Urine Culture     Status: Abnormal   Collection Time: 09/04/20  6:51 PM   Specimen: Urine, Random  Result Value Ref Range Status   Specimen Description   Final    URINE, RANDOM Performed at [facility], [address], [place], [place] [address]    Special Requests   Final    NONE Performed at [facility], [address], [place], [place] [address]    Culture >=100,000 COLONIES/mL ESCHERICHIA COLI (A)  Final   Report Status 09/07/2020 FINAL  Final   Organism ID, Bacteria ESCHERICHIA COLI (A)  Final      Susceptibility   Escherichia coli - MIC*    AMPICILLIN 8 SENSITIVE Sensitive     CEFAZOLIN <=4 SENSITIVE Sensitive     CEFEPIME <=0.12 SENSITIVE Sensitive     CEFTRIAXONE <=0.25 SENSITIVE Sensitive     CIPROFLOXACIN <=0.25 SENSITIVE Sensitive     GENTAMICIN <=1 SENSITIVE Sensitive     IMIPENEM <=0.25 SENSITIVE Sensitive     NITROFURANTOIN <=16  SENSITIVE Sensitive     TRIMETH/SULFA <=20 SENSITIVE Sensitive     AMPICILLIN/SULBACTAM 4 SENSITIVE Sensitive     PIP/TAZO <=4 SENSITIVE Sensitive     * >=100,000 COLONIES/mL ESCHERICHIA COLI  Resp Panel by RT-PCR (Flu A&B, Covid) Nasopharyngeal Swab     Status: None   Collection Time: 09/04/20  9:25 PM   Specimen: Nasopharyngeal Swab; Nasopharyngeal(NP) swabs in vial transport medium  Result Value Ref Range Status   SARS Coronavirus 2 by RT PCR NEGATIVE NEGATIVE Final    Comment: (NOTE) SARS-CoV-2 target nucleic acids are NOT DETECTED.  The SARS-CoV-2 RNA is generally detectable in upper  respiratory specimens during the acute phase of infection. The lowest concentration of SARS-CoV-2 viral copies this assay can detect is 138 copies/mL. A negative result does not preclude SARS-Cov-2 infection and should not be used as the sole basis for treatment or other patient management decisions. A negative result may occur with  improper specimen collection/handling, submission of specimen other than nasopharyngeal swab, presence of viral mutation(s) within the areas targeted by this assay, and inadequate number of viral copies(<138 copies/mL). A negative result must be combined with clinical observations, patient history, and epidemiological information. The expected result is Negative.  Fact Sheet for Patients:  BloggerCourse.com  Fact Sheet for Healthcare Providers:  SeriousBroker.it  This test is no t yet approved or cleared by the [place] FDA and  has been authorized for detection and/or diagnosis of SARS-CoV-2 by FDA under an Emergency Use Authorization (EUA). This EUA will remain  in effect (meaning this test can be used) for the duration of the COVID-19 declaration under Section 564(b)(1) of the Act, 21 U.S.C.section 360bbb-3(b)(1), unless the authorization is terminated  or revoked sooner.       Influenza A by PCR NEGATIVE NEGATIVE Final   Influenza B by PCR NEGATIVE NEGATIVE Final    Comment: (NOTE) The Xpert Xpress SARS-CoV-2/FLU/RSV plus assay is intended as an aid in the diagnosis of influenza from Nasopharyngeal swab specimens and should not be used as a sole basis for treatment. Nasal washings and aspirates are unacceptable for Xpert Xpress SARS-CoV-2/FLU/RSV testing.  Fact Sheet for Patients: BloggerCourse.com  Fact Sheet for Healthcare Providers: SeriousBroker.it  This test is not yet approved or cleared by the [place] FDA and has been  authorized for detection and/or diagnosis of SARS-CoV-2 by FDA under an Emergency Use Authorization (EUA). This EUA will remain in effect (meaning this test can be used) for the duration of the COVID-19 declaration under Section 564(b)(1) of the Act, 21 U.S.C. section 360bbb-3(b)(1), unless the authorization is terminated or revoked.  Performed at [facility], [address], [place], [place] [address]   Blood culture (routine x 2)     Status: None   Collection Time: 09/04/20  9:40 PM   Specimen: BLOOD  Result Value Ref Range Status   Specimen Description   Final    BLOOD SITE NOT SPECIFIED Performed at [facility], [address] [place], [place] [address]    Special Requests   Final    BOTTLES DRAWN AEROBIC AND ANAEROBIC Blood Culture adequate volume Performed at [facility], [address], [place], [place] [address]    Culture   Final    NO GROWTH 5 DAYS Performed at [facility], [address] [address], [place], [place] [address]    Report Status 09/10/2020 FINAL  Final  Culture, blood (routine x 2)     Status: None  Collection Time: 09/09/20 10:08 AM   Specimen: BLOOD  Result Value Ref Range Status   Specimen Description   Final    BLOOD BLOOD LEFT FOREARM Performed at [facility], [address] [place], [place] [address]    Special Requests   Final    BOTTLES DRAWN AEROBIC AND ANAEROBIC Blood Culture adequate volume Performed at [facility], [address], [place], [place] [address]    Culture   Final    NO GROWTH 5 DAYS Performed at [facility], [address], [place], [place] [address]    Report Status 09/14/2020 FINAL  Final  Culture, blood (routine x 2)     Status: None   Collection Time: 09/09/20 10:10 AM   Specimen: BLOOD  Result Value Ref Range Status   Specimen Description   Final    BLOOD LEFT WRIST Performed at [facility], [address], [place], [place] [address]    Special Requests   Final    BOTTLES DRAWN AEROBIC AND ANAEROBIC Blood Culture adequate volume Performed at [facility], [address], [place], [place] [address]    Culture   Final    NO GROWTH 5 DAYS Performed at [facility], [address], [place], [place] [address]    Report Status 09/14/2020 FINAL  Final    RN Pressure Injury Documentation: Pressure Injury 09/14/20 Sacrum Mid Stage 2 -  Partial thickness loss of dermis presenting as a shallow open injury with a red, pink wound bed without slough. (Active)  09/14/20 0800  Location: Sacrum  Location Orientation: Mid  Staging: Stage 2 -  Partial thickness loss of dermis presenting as a shallow open injury with a red, pink wound bed without slough.  Wound Description (Comments):   Present on Admission:     Estimated body mass index is 23.04 kg/m as calculated from the following:   Height as of this encounter: 5\' 3"  (1.6 m).   Weight as of this encounter: 59 kg.  Malnutrition Type:  Nutrition Problem: Inadequate oral intake Etiology: acute illness,lethargy/confusion  Malnutrition Characteristics:  Signs/Symptoms: per patient/family report (son reports poor intake x 3 days, per MD- UTI, dehydration, AKI on admission)  Nutrition Interventions:  Interventions: Ensure Enlive (each supplement provides 350kcal and 20 grams of protein),Magic cup,Liberalize Diet   Radiology Studies: No results found. Scheduled Meds: . feeding supplement  1 Container Oral TID BM  . feeding supplement  237 mL Oral BID BM  . levETIRAcetam  250 mg Oral BID  . polyvinyl alcohol  2 drop Both Eyes TID   Continuous Infusions:   LOS: 9 days   , DO [facility] PAGER is on AMION  If 7PM-7AM, please contact night-coverage [email or web address]

## 2020-09-14 NOTE — Progress Notes (Signed)
Calorie Count Note  48 hour calorie count ordered.  Diet: Dysphagia 1, thin liquids Supplements: Ensure Enlive BID and Magic Cup TID  Breakfast (3/28): 255 kcal and 14 grams protein Lunch (3/28): no documentation; meal provided 387 kcal and 9 grams protein but do not anticipate that patient consumed 100% of this based on trends with recent intakes Dinner (3/28): no meal ordered Breakfast (3/29): 20 kcal and 1 gram protein Supplements: no documentation available so unable to determine  Known Total intake: 275 kcal (21% of minimum estimated needs)  15 protein (23% of minimum estimated needs)  Nutrition Dx:  Inadequate oral intake related to acute illness,lethargy/confusion as evidenced by per patient/family report (son reports poor intake x 3 days, per MD. --ongoing  Goal:  Patient will meet greater than or equal to 90% of their needs. --unmet  Intervention:  - continue Ensure Enlive BID and Magic Cup TID. - will order Boost Breeze TID, each supplement provides 250 kcal and 9 grams of protein - monitor for Palliative Care note following family meeting this afternoon    No family/visitors present at the time of visit. Patient laying in bed with eyes closed throughout visit although does move some when RD talks to nursing student who is at bedside. Patient does not vocalize in any way.   Nursing student aided patient with breakfast which consisted of 1/2 cup of chicken broth and a few small sips of Boost Breeze. Student reports that patient did not want Ensure.   Student reports that patient was drinking chicken broth quickly and did begin to cough. After a few coughs she stopped coughing and did not cough with further sips of liquids.  A list of items patient may enjoy for lunch has been left by her son. Over the past 24-48 hours patient has been consuming FLD-type items, no substantial foods.   Patient has had extremely poor PO intakes during the 9 days of hospitalization,  anticipate PO intakes were also inadequate PTA. Patient has been clear in her desire to not receive tube feeding.      Trenton Gammon, MS, RD, LDN, CNSC Inpatient Clinical Dietitian RD pager # available in AMION  After hours/weekend pager # available in [facility]

## 2020-09-15 LAB — COMPREHENSIVE METABOLIC PANEL
ALT: 9 U/L (ref 0–44)
AST: 13 U/L — ABNORMAL LOW (ref 15–41)
Albumin: 2.8 g/dL — ABNORMAL LOW (ref 3.5–5.0)
Alkaline Phosphatase: 52 U/L (ref 38–126)
Anion gap: 9 (ref 5–15)
BUN: 17 mg/dL (ref 8–23)
CO2: 27 mmol/L (ref 22–32)
Calcium: 9 mg/dL (ref 8.9–10.3)
Chloride: 105 mmol/L (ref 98–111)
Creatinine, Ser: 0.91 mg/dL (ref 0.44–1.00)
GFR, Estimated: 57 mL/min — ABNORMAL LOW (ref >60)
Glucose, Bld: 133 mg/dL — ABNORMAL HIGH (ref 70–99)
Potassium: 4 mmol/L (ref 3.5–5.1)
Sodium: 141 mmol/L (ref 135–145)
Total Bilirubin: 0.5 mg/dL (ref 0.3–1.2)
Total Protein: 5.9 g/dL — ABNORMAL LOW (ref 6.5–8.1)

## 2020-09-15 LAB — CBC WITH DIFFERENTIAL/PLATELET
Abs Immature Granulocytes: 0.06 10*3/uL (ref 0.00–0.07)
Basophils Absolute: 0.1 10*3/uL (ref 0.0–0.1)
Basophils Relative: 1 %
Eosinophils Absolute: 0.3 10*3/uL (ref 0.0–0.5)
Eosinophils Relative: 3 %
HCT: 35.2 % — ABNORMAL LOW (ref 36.0–46.0)
Hemoglobin: 11 g/dL — ABNORMAL LOW (ref 12.0–15.0)
Immature Granulocytes: 1 %
Lymphocytes Relative: 23 %
Lymphs Abs: 1.8 10*3/uL (ref 0.7–4.0)
MCH: 30.5 pg (ref 26.0–34.0)
MCHC: 31.3 g/dL (ref 30.0–36.0)
MCV: 97.5 fL (ref 80.0–100.0)
Monocytes Absolute: 1 10*3/uL (ref 0.1–1.0)
Monocytes Relative: 13 %
Neutro Abs: 4.7 10*3/uL (ref 1.7–7.7)
Neutrophils Relative %: 59 %
Platelets: 221 10*3/uL (ref 150–400)
RBC: 3.61 MIL/uL — ABNORMAL LOW (ref 3.87–5.11)
RDW: 13.8 % (ref 11.5–15.5)
WBC: 7.9 10*3/uL (ref 4.0–10.5)
nRBC: 0 % (ref 0.0–0.2)

## 2020-09-15 LAB — PHOSPHORUS: Phosphorus: 3.8 mg/dL (ref 2.5–4.6)

## 2020-09-15 LAB — MAGNESIUM: Magnesium: 2 mg/dL (ref 1.7–2.4)

## 2020-09-15 NOTE — Progress Notes (Signed)
PROGRESS NOTE   Lori Ellis  JSH:[id number]    DOB: 1921/01/17    DOA: 09/04/2020  PCP: Truett Perna, MD   I have briefly reviewed patients previous medical records in [facility].  Chief Complaint  Patient presents with  . Altered Mental Status    Brief Narrative:  87 year old female, resident of ALF, with medical history significant for but not limited to HTN, DM 2, peripheral neuropathy, spinal stenosis, chronic pain, hypothyroidism, vitamin D deficiency admitted to the hospital on 09/04/2020 due to worsening confusion.  Recent hospitalization for syncopal episode, found to have UTI and discharged on cefdinir.  Neurology was consulted.  Extensively evaluated for AMS including MRI, repeat EEG etc.  AEDs were changed from Depakote to Keppra.  Hospital course complicated by intermittent AMS but overall seems better than on admission, fluctuating but persistent poor oral intake.  Palliative care were consulted for goals of care discussion and plan to meet with family this afternoon.   Assessment & Plan:  Active Problems:   Hypothyroidism   Type II diabetes mellitus with renal manifestations (HCC)   Benign essential HTN   Acute lower UTI   Metabolic encephalopathy   AKI (acute kidney injury) (HCC)   Acute metabolic encephalopathy   Pressure injury of skin   Acute metabolic encephalopathy/recent vasovagal syncope  Suspected multifactorial due to partially treated UTI, dehydration, AKI and possible that she has underlying cognitive impairment given her advanced age.  No focal deficits.  CT head without acute findings.  MRI brain showed atrophy, remote infarcts but no acute infarct identified  Folate, B12, TSH, ammonia: Normal.  Completed UTI treatment with 6 days of IV ceftriaxone.  09/06/2020 raised concern for epileptiform activity in right temporal area.  Loaded with IV Depakote and started on p.o. Depakote but became somnolent and the ED was eventually changed to  Keppra.  Repeat EEG reviewed by neurology, not consistent with ongoing seizures.  Epilepsy ruled out.  Per neurology note 3/25: EEG not consistent with ongoing seizures, but does reveal bitemporal slowing which is suggestive of bitemporal focal neuronal dysfunction and recommended continuing Keppra 250 mg twice daily.  Delirium precautions.  Avoid Haldol.  Not sure if vitamin B1 levels were requested.  S/p IV thiamine.  Adult failure to thrive/poor oral intake  Prior TRH MD discussed extensively with patient's family.  Patient did not want any artificial feeding, PEG tube or even cor track.  Per RN, patient yells for something to eat and did drink Ensure and ate better last night.  Need to monitor for consistency.  Partially treated E. coli UTI  Pansensitive E. coli  Completed 6 days of IV ceftriaxone.  Blood cultures x2 from 3/24: Negative.  Acute kidney injury complicating stage IIIb CKD  Suspected due to dehydration from poor oral intake.  Baseline creatinine around 1.  Lisinopril held.  Resolved after IV fluid hydration.  Remains at risk for recurrent dehydration and AKI due to inconsistent and poor oral intake.  Hypokalemia/hypophosphatemia  Replaced.  Magnesium normal.  Essential hypertension  Controlled.  On propranolol 20 mg at bedtime.  Amlodipine and lisinopril held.  Hypothyroidism  TSH 2.8 on 3/17  Continue home dose of levothyroxine  Type II DM with peripheral neuropathy  A1 recently 6.6, controlled.  Gabapentin held due to recent acute encephalopathy.  Chronic low back pain/lumbar spinal stenosis  Reportedly moves around with the help of a wheelchair.  Thrombocytopenia  Resolved.  Goals of care  Palliative care on board and plans  to meet with patient/family 3/30.  Palliative care has been following up with patient/family  Body mass index is 23.04 kg/m.  Nutritional Status Nutrition Problem: Inadequate oral intake Etiology: acute  illness,lethargy/confusion Signs/Symptoms: per patient/family report (son reports poor intake x 3 days, per MD- UTI, dehydration, AKI on admission) Interventions: Ensure Enlive (each supplement provides 350kcal and 20 grams of protein),Magic cup,Liberalize Diet    DVT prophylaxis: enoxaparin (LOVENOX) injection 30 mg Start: 09/14/20 2200     Code Status: DNR Family Communication: Discussed in detail with patient's son [name] at bedside, updated care and answered questions this morning. Disposition:  Status is: Inpatient  Remains inpatient appropriate because:Inpatient level of care appropriate due to severity of illness   Dispo:  Patient From: Assisted Living Facility  Planned Disposition: Skilled Nursing Facility  Medically stable for discharge: No          Consultants:   Palliative care medicine Neurology.  Procedures:   None  Antimicrobials:    Anti-infectives (From admission, onward)   Start     Dose/Rate Route Frequency Ordered Stop   09/05/20 2100  cefTRIAXone (ROCEPHIN) 1 g in sodium chloride 0.9 % 100 mL IVPB        1 g 200 mL/hr over 30 Minutes Intravenous Every 24 hours 09/04/20 2334 09/10/20 2208   09/04/20 2045  cefTRIAXone (ROCEPHIN) 1 g in sodium chloride 0.9 % 100 mL IVPB        1 g 200 mL/hr over 30 Minutes Intravenous  Once 09/04/20 2031 09/05/20 0027        Subjective:  Patient interviewed and examined along with her son and patient's female RN at bedside.  Patient mostly nonverbal, occasionally says a word or 2.  Tracks activity around the room.  Per RN, overnight yelled out for something to eat and did tolerate Ensure and some other nutritional supplements.  No other acute issues noted  Objective:   Vitals:   09/14/20 0753 09/14/20 1042 09/14/20 2019 09/15/20 1233  BP: 133/64 134/71 102/60 (!) 144/66  Pulse: (!) 105 100 90 82  Resp: (!) 23 (!) 22 17 16   Temp: 98.9 F (37.2 C)  98.7 F (37.1 C) 99 F (37.2 C)  TempSrc: Oral  Axillary Oral   SpO2: 97% 100% 96% 97%  Weight:      Height:        General exam: Very elderly female, moderately built and frail lying comfortably propped up in bed without distress. Respiratory system: Clear to auscultation. Respiratory effort normal. Cardiovascular system: S1 & S2 heard, RRR. No JVD, murmurs, rubs, gallops or clicks. No pedal edema.  Not on telemetry. Gastrointestinal system: Abdomen is nondistended, soft and nontender. No organomegaly or masses felt. Normal bowel sounds heard. Central nervous system: Alert but does not seem to be oriented.  Mental status as noted above. No focal neurological deficits. Extremities: Appears to be moving both upper extremities symmetrically and well. Skin: No rashes, lesions or ulcers Psychiatry: Judgement and insight impaired. Mood & affect cannot be assessed.  Intermittently smiles    Data Reviewed:   I have personally reviewed following labs and imaging studies   CBC: Recent Labs  Lab 09/12/20 0453 09/13/20 0452 09/15/20 0526  WBC 7.8 7.3 7.9  NEUTROABS 5.2 4.1 4.7  HGB 10.8* 10.1* 11.0*  HCT 34.7* 32.9* 35.2*  MCV 96.4 97.6 97.5  PLT 174 165 221    Basic Metabolic Panel: Recent Labs  Lab 09/12/20 0453 09/13/20 0452 09/15/20 0526  NA 139 141  141  K 4.0 4.1 4.0  CL 108 110 105  CO2 23 25 27   GLUCOSE 164* 148* 133*  BUN 13 14 17   CREATININE 0.79 0.78 0.91  CALCIUM 8.5* 8.6* 9.0  MG 2.3 1.9 2.0  PHOS 2.9 2.8 3.8    Liver Function Tests: Recent Labs  Lab 09/12/20 0453 09/13/20 0452 09/15/20 0526  AST 12* 13* 13*  ALT 9 10 9   ALKPHOS 55 52 52  BILITOT 0.6 0.4 0.5  PROT 5.6* 5.4* 5.9*  ALBUMIN 2.6* 2.5* 2.8*    CBG: No results for input(s): GLUCAP in the last 168 hours.  Microbiology Studies:   Recent Results (from the past 240 hour(s))  Culture, blood (routine x 2)     Status: None   Collection Time: 09/09/20 10:08 AM   Specimen: BLOOD  Result Value Ref Range Status   Specimen Description   Final    BLOOD  BLOOD LEFT FOREARM Performed at [facility], [address] [place], [place]    Special Requests   Final    BOTTLES DRAWN AEROBIC AND ANAEROBIC Blood Culture adequate volume Performed at [facility], [address] [place], [place]    Culture   Final    NO GROWTH 5 DAYS Performed at [facility], [address] [place], [facility] [address]    Report Status 09/14/2020 FINAL  Final  Culture, blood (routine x 2)     Status: None   Collection Time: 09/09/20 10:10 AM   Specimen: BLOOD  Result Value Ref Range Status   Specimen Description   Final    BLOOD LEFT WRIST Performed at [facility], [address], [place], [place]    Special Requests   Final    BOTTLES DRAWN AEROBIC AND ANAEROBIC Blood Culture adequate volume Performed at [facility], [address], [place], [place]    Culture   Final    NO GROWTH 5 DAYS Performed at [facility], [address] [place], [facility] [address]    Report Status 09/14/2020 FINAL  Final     Radiology Studies:  No results found.   Scheduled Meds:   . ascorbic acid  500 mg Oral Daily  . cholecalciferol  2,000 Units Oral q morning  . enoxaparin (LOVENOX) injection  30 mg Subcutaneous Q24H  . feeding supplement  1 Container Oral TID BM  . feeding supplement  237 mL Oral BID BM  . gabapentin  300 mg Oral BID  . levETIRAcetam  250 mg Oral BID  . levothyroxine  75 mcg Oral QAC breakfast  . polyvinyl alcohol  2 drop Both Eyes TID  . propranolol  20 mg Oral QHS    Continuous Infusions:     LOS: 10 days     [place], MD, [place], [facility]. [facility]    To contact the attending provider between 7A-7P or the covering provider during after hours 7P-7A, please log into the web site [email or web address] and access using universal [place] password for that web site. If you do not have the password, please  call the hospital operator.  09/15/2020, 3:36 PM

## 2020-09-15 NOTE — Progress Notes (Signed)
OT Cancellation Note  Patient Details Name: Lori Ellis MRN: [id number] DOB: [birth year]   Cancelled Treatment:    Reason Eval/Treat Not Completed: OT screened, no needs identified, will sign off. Per palliative note on 3/29 "both sons verbalized understanding of utilization of these drugs are to treat symptoms in order to enhance her comfort." Per chart  Review patient's mental status still waxing and waning with poor P.O. intake. Occupational therapy performed evaluation on 3/26 and patient has not had significant change in status. Please re-order if patient's nutrition + mentation improves and consistently showing ability to participate/follow directions.   Marlyce Huge OT OT pager: [phone]   Carmelia Roller 09/15/2020, 6:39 AM

## 2020-09-15 NOTE — TOC Progression Note (Addendum)
Transition of Care [facility]) - Progression Note    Patient Details  Name: Lori Ellis MRN: [id number] Date of Birth: [birth year]  Transition of Care [facility]) CM/SW Contact  Prudencio Velazco, Olegario Messier, RN Phone Number: 09/15/2020, 9:35 AM  Clinical Narrative:Noted palliative meeting-comfort & dignity;ongoing discussions today with Sons.       Barriers to Discharge: Continued Medical Work up  Expected Discharge Plan and Services Expected Discharge Plan: Long Term Nursing Home       Living arrangements for the past 2 months: Assisted Living Facility                                       Social Determinants of Health (SDOH) Interventions    Readmission Risk Interventions No flowsheet data found.

## 2020-09-15 NOTE — Progress Notes (Signed)
Patient ID: Lori Ellis, female   DOB: 1920/07/23, 87 y.o.   MRN: [id number]  Medical records reviewed   87 y.o.femalewith past medical history oftype 2 diabetes, peripheral neuropathy, chronic pain, hypothyroidism, hypertension, and chronic kidney disease stage III. Patient presented to the ED from [facility] assisted living facility with concerns for altered mental status. Patient was recently hospitalized for syncopal episodes and also treated for UTI (discharged on antibiotics). CT of head showed no acute abnormalities. Chest x-ray unremarkable. 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  Patient has continued to fail to thrive during this hospital stay in spite of full medical support.  Poor po intake and mentation waxes and wanes.   Patient's 2 sons Fritz Pickerel and [name]) in conversation with PMT  have made decision for focus of care to be comfort and dignity, allowing for a natural death.         Patient has documented declaration of a natural death.     Today [name] is lethargic, she continues with poor p.o. intake, decreasing urine output  increased lethargy and overall failure to thrive.   This nurse practitioner met again today at the bedside with her 2 sons for continued conversation regarding current medical situation and transition of care.  Both sons verbalized an understanding of the patient's current situation, her overall failure to thrive and her likely limited prognosis.   Education offered on the natural trajectory and expectations of end-of-life.  Focus is comfort and dignity, allowing for a natural death.   Plan of Care: -DNR/DNI -no artifical feeding or hydration now or in the future        - diet as toelrated -minimize medication to only those that enhance comfort   -Roxanol-for underlying pain and dyspnea    -Haldol-for agitation -no further diagnostics - family is hopeful for [facility] for EOL care- will place referral  - prognosis is likely  less than a few weeks   Continued  education explaining hospice benefit and the difference between hospice services in the home, in a skilled nursing facility and in a residential facility.  Questions and concerns addressed     Total time spent on the unit was 60 minutes  Greater than 50% of the time was spent in counseling and coordination of care  Wadie Lessen NP  Palliative Medicine Team Team Phone # [phone] Pager [phone]

## 2020-09-15 NOTE — Progress Notes (Signed)
Physical Therapy Discharge Patient Details Name: Lori Ellis MRN: [id number] DOB: 1921-01-02 Today's Date: 09/15/2020 Time:  -     Patient discharged from PT services secondary to medical decline - please refer to Palliative  Medicine notes in regards that patient is  For comfort care measures. Patient has not been able to participate in the PT  Efforts previously rendered.. Please see latest therapy progress note for current level of functioning and progress toward goals.      GP     Rada Hay 09/15/2020, 6:46 AM Blanchard Kelch PT Acute Rehabilitation Services Pager [phone] Office [phone]

## 2020-09-16 MED ORDER — PROPRANOLOL HCL 20 MG PO TABS
20.0000 mg | ORAL_TABLET | Freq: Every day | ORAL | Status: AC
Start: 2020-09-16 — End: ?

## 2020-09-16 MED ORDER — HALOPERIDOL LACTATE 2 MG/ML PO CONC: 5.0000 mg | Freq: Four times a day (QID) | ORAL | Status: AC | PRN

## 2020-09-16 MED ORDER — HALOPERIDOL LACTATE 2 MG/ML PO CONC
5.0000 mg | Freq: Four times a day (QID) | ORAL | Status: DC | PRN
  Filled 2020-09-16: qty 2.5

## 2020-09-16 MED ORDER — MORPHINE SULFATE (CONCENTRATE) 10 MG/0.5ML PO SOLN: 5.0000 mg | ORAL | Status: AC | PRN

## 2020-09-16 MED ORDER — LEVETIRACETAM 250 MG PO TABS: 250.0000 mg | ORAL_TABLET | Freq: Two times a day (BID) | ORAL | Status: AC

## 2020-09-16 MED ORDER — POLYVINYL ALCOHOL 1.4 % OP SOLN: 2.0000 [drp] | Freq: Three times a day (TID) | OPHTHALMIC | Status: AC

## 2020-09-16 MED ORDER — GABAPENTIN 300 MG PO CAPS: 300.0000 mg | ORAL_CAPSULE | Freq: Two times a day (BID) | ORAL | Status: AC

## 2020-09-16 NOTE — Discharge Summary (Addendum)
Physician Discharge Summary  Lori Ellis SWN:[id number] DOB: Nov 17, 1920  PCP: Rudene Anda, MD  Admitted from: SNF Discharged to: [facility] in [place], [place]  Admit date: 09/04/2020 Discharge date: 09/16/2020  Recommendations for Outpatient Follow-up:    Follow-up Information    MD at [facility] in [place] Follow up.   Why: Follow-up regarding residential hospice.       Rudene Anda, MD Follow up.   Specialty: Internal Medicine Contact information: [address] [place] [address] [phone]                Home Health: None    Equipment/Devices: None    Discharge Condition: Guarded and at high risk for decline and death.   Code Status: DNR Diet recommendation:  Discharge Diet Orders (From admission, onward)    Start     Ordered   09/16/20 0000  Diet general       Comments: Comfort feeds of choice.   09/16/20 1504           Discharge Diagnoses:  Active Problems:   Hypothyroidism   Type II diabetes mellitus with renal manifestations (HCC)   Benign essential HTN   Acute lower UTI   Metabolic encephalopathy   AKI (acute kidney injury) ([place])   Acute metabolic encephalopathy   Pressure injury of skin   Brief Summary: 87 year old female, resident of ALF, with medical history significant for but not limited to HTN, DM 2, peripheral neuropathy, spinal stenosis, chronic pain, hypothyroidism, vitamin D deficiency admitted to the hospital on 09/04/2020 due to worsening confusion.  Recent hospitalization for syncopal episode, found to have UTI and discharged on cefdinir.  Neurology was consulted.  Extensively evaluated for AMS including MRI, repeat EEG etc.  AEDs were changed from Depakote to [place].  Hospital course complicated by fluctuating mental status changes, fluctuating but persistent poor oral intake.  Palliative care were consulted for goals of care discussion, patient was transitioned to comfort care and is being discharged to  residential hospice.   Assessment & Plan:   Acute metabolic encephalopathy/recent vasovagal syncope  Suspected multifactorial due to partially treated UTI, dehydration, AKI and possible that she has underlying cognitive impairment given her advanced age.  No focal deficits.  CT head without acute findings.  MRI brain showed atrophy, remote infarcts but no acute infarct identified  Folate, B12, TSH, ammonia: Normal.  Completed UTI treatment with 6 days of IV ceftriaxone.  09/06/2020 raised concern for epileptiform activity in right temporal area.  Loaded with IV Depakote and started on p.o. Depakote but became somnolent and the ED was eventually changed to [place].  Repeat EEG reviewed by neurology, not consistent with ongoing seizures.  Epilepsy ruled out.  Per neurology note 3/25: EEG not consistent with ongoing seizures, but does reveal bitemporal slowing which is suggestive of bitemporal focal neuronal dysfunction and recommended continuing Keppra 250 mg twice daily.  Delirium precautions.  Avoid Haldol.  Not sure if vitamin B1 levels were requested.  S/p IV thiamine.  Waxing and waning mental status changes, not consistently improving.  Palliative care team met with patient and family yesterday and today.  I discussed with palliative this morning.  Family have not decided on full comfort care and discharge to residential hospice.  Most of medications nonessential for comfort have been discontinued, rest can be readdressed at the residential hospice.  Adult failure to thrive/poor oral intake  Prior TRH MD discussed extensively with patient's family.  Patient did not  want any artificial feeding, PEG tube or even cor track.  Per RN, patient yells for something to eat and did drink Ensure and ate better last night.  Need to monitor for consistency.  As noted above, now full comfort care and being discharged to residential hospice.  Partially treated E. coli UTI  Pansensitive  E. coli  Completed 6 days of IV ceftriaxone.  Blood cultures x2 from 3/24: Negative.  Acute kidney injury complicating stage IIIb CKD  Suspected due to dehydration from poor oral intake.  Baseline creatinine around 1.  Lisinopril held.  Resolved after IV fluid hydration.  Remains at risk for recurrent dehydration and AKI due to inconsistent and poor oral intake.  Hypokalemia/hypophosphatemia  Replaced.  Magnesium normal.  Essential hypertension  Controlled.  On propranolol 20 mg at bedtime, continued at discharge to avoid tachycardia and related symptoms.  Amlodipine and lisinopril discontinued  Hypothyroidism  TSH 2.8 on 3/17  Synthroid discontinued at discharge  Type II DM with peripheral neuropathy  A1 recently 6.6, controlled.  Gabapentin was briefly held and resumed at family's request with  Chronic low back pain/lumbar spinal stenosis  Reportedly moved around with the help of a wheelchair.  Thrombocytopenia  Resolved.  Goals of care  As noted above  Body mass index is 23.04 kg/m.  Nutritional Status Nutrition Problem: Inadequate oral intake Etiology: acute illness,lethargy/confusion Signs/Symptoms: per patient/family report (son reports poor intake x 3 days, per MD- UTI, dehydration, AKI on admission) Interventions: Ensure Enlive (each supplement provides 350kcal and 20 grams of protein),Magic cup,Liberalize Diet   Pressure Injury 09/14/20 Sacrum Mid Stage 2 -  Partial thickness loss of dermis presenting as a shallow open injury with a red, pink wound bed without slough. (Active)  09/14/20 0800  Location: Sacrum  Location Orientation: Mid  Staging: Stage 2 -  Partial thickness loss of dermis presenting as a shallow open injury with a red, pink wound bed without slough.  Wound Description (Comments):   Present on Admission:      Pressure Injury 09/14/20 Heel Right Deep Tissue Pressure Injury - Purple or maroon localized area of  discolored intact skin or blood-filled blister due to damage of underlying soft tissue from pressure and/or shear. (Active)  09/14/20 1700  Location: Heel  Location Orientation: Right  Staging: Deep Tissue Pressure Injury - Purple or maroon localized area of discolored intact skin or blood-filled blister due to damage of underlying soft tissue from pressure and/or shear.  Wound Description (Comments):   Present on Admission:         Consultants:   Palliative care medicine Neurology.  Procedures:   None   Discharge Instructions  Discharge Instructions    Call MD for:  difficulty breathing, headache or visual disturbances   Complete by: As directed    Call MD for:  extreme fatigue   Complete by: As directed    Call MD for:  persistant dizziness or light-headedness   Complete by: As directed    Call MD for:  persistant nausea and vomiting   Complete by: As directed    Call MD for:  severe uncontrolled pain   Complete by: As directed    Call MD for:  temperature >100.4   Complete by: As directed    Diet general   Complete by: As directed    Comfort feeds of choice.   Increase activity slowly   Complete by: As directed    No wound care   Complete by: As directed  Medication List    STOP taking these medications   acetaminophen 325 MG tablet Commonly known as: TYLENOL   amLODipine 2.5 MG tablet Commonly known as: NORVASC   amLODipine 5 MG tablet Commonly known as: NORVASC   ascorbic acid 500 MG tablet Commonly known as: VITAMIN C   cefdinir 300 MG capsule Commonly known as: OMNICEF   levothyroxine 75 MCG tablet Commonly known as: SYNTHROID   lisinopril 5 MG tablet Commonly known as: ZESTRIL   melatonin 3 MG Tabs tablet   senna 8.6 MG Tabs tablet Commonly known as: SENOKOT   traMADol 50 MG tablet Commonly known as: ULTRAM   Vitamin D3 50 MCG (2000 UT) Tabs     TAKE these medications   gabapentin 300 MG capsule Commonly known as:  NEURONTIN Take 1 capsule (300 mg total) by mouth 2 (two) times daily.   haloperidol 2 MG/ML solution Commonly known as: HALDOL Take 2.5 mLs (5 mg total) by mouth every 6 (six) hours as needed for agitation.   levETIRAcetam 250 MG tablet Commonly known as: KEPPRA Take 1 tablet (250 mg total) by mouth 2 (two) times daily.   morphine CONCENTRATE 10 MG/0.5ML Soln concentrated solution Take 0.25 mLs (5 mg total) by mouth every 4 (four) hours as needed for moderate pain or shortness of breath.   polyvinyl alcohol 1.4 % ophthalmic solution Commonly known as: LIQUIFILM TEARS Place 2 drops into both eyes 3 (three) times daily.   propranolol 20 MG tablet Commonly known as: INDERAL Take 1 tablet (20 mg total) by mouth at bedtime. What changed:   how much to take  when to take this  additional instructions      Allergies  Allergen Reactions  . Fentanyl Other (See Comments)    Severe delusions    . Penicillins Hives and Rash    Has tolerated cephalexin, ceftriaxone  Did it involve swelling of the face/tongue/throat, SOB, or low BP? Unk Did it involve sudden or severe rash/hives, skin peeling, or any reaction on the inside of your mouth or nose? Unk Did you need to seek medical attention at a hospital or doctor's office? Unk When did it last happen? "A long time ago" If all above answers are "NO", may proceed with cephalosporin use.   . Tetanus Toxoid Hives  . Adhesive [Tape] Other (See Comments)    TEARS THE SKIN!!  . Advil [Ibuprofen] Other (See Comments)    Was told by MD to not take  . Hydrochlorothiazide Other (See Comments)    Hyponatremia   . Naproxen Sodium Rash      Procedures/Studies: CT HEAD WO CONTRAST  Result Date: 09/04/2020 CLINICAL DATA:  Altered mental status. EXAM: CT HEAD WITHOUT CONTRAST TECHNIQUE: Contiguous axial images were obtained from the base of the skull through the vertex without intravenous contrast. COMPARISON:  CT head dated 06/19/2020.  FINDINGS: Brain: No evidence of acute infarction, hemorrhage, hydrocephalus, extra-axial collection or mass lesion/mass effect. There is mild cerebral volume loss with associated ex vacuo dilatation. Periventricular white matter hypoattenuation likely represents chronic small vessel ischemic disease. Vascular: There are vascular calcifications in the carotid siphons. Skull: Normal. Negative for fracture or focal lesion. Sinuses/Orbits: No acute finding. Other: None. IMPRESSION: No acute intracranial process. Electronically Signed   By: Zerita Boers M.D.   On: 09/04/2020 18:51   MR BRAIN WO CONTRAST  Result Date: 09/05/2020 CLINICAL DATA:  Altered mental status of unknown etiology. EXAM: MRI HEAD WITHOUT CONTRAST TECHNIQUE: Multiplanar, multiecho pulse sequences of the  brain and surrounding structures were obtained without intravenous contrast. COMPARISON:  Head CT yesterday. FINDINGS: Brain: Diffusion imaging does not show any true restricted diffusion to indicate acute or subacute infarction. There are 2 foci of T2 shine through seen within the frontoparietal white matter, 1 on each side. No cortical or large vessel territory insult. Chronic small-vessel ischemic changes affect pons. Few old small vessel cerebellar infarctions. There chronic small-vessel ischemic changes throughout the deep and subcortical white matter. No mass lesion, hemorrhage, hydrocephalus or extra-axial collection. Few punctate foci of hemosiderin deposition associated with some of the old small vessel infarctions. Vascular: Major vessels at the base of the brain show flow. Skull and upper cervical spine: Negative Sinuses/Orbits: Clear/normal Other: None IMPRESSION: No acute or reversible finding. Atrophy and small-vessel ischemic changes throughout the brain as outlined above. Two of the old small vessel insults show T2 shine through, simulating more recent infarctions in the white matter, but there is no low signal on ADC map,  indicating that these are in fact nonacute. Electronically Signed   By: Nelson Chimes M.D.   On: 09/05/2020 16:48   DG Chest Port 1 View  Result Date: 09/04/2020 CLINICAL DATA:  87 year old female with altered mental status. EXAM: PORTABLE CHEST 1 VIEW COMPARISON:  Chest radiograph dated 09/02/2020. FINDINGS: Probable left lung base atelectasis. No focal consolidation, pleural effusion or pneumothorax. Stable cardiomegaly. Atherosclerotic calcification of the aorta. Osteopenia with degenerative changes of the spine and shoulders. No acute osseous pathology. IMPRESSION: No active disease. Electronically Signed   By: Anner Crete M.D.   On: 09/04/2020 21:23   DG Chest Port 1 View  Result Date: 09/02/2020 CLINICAL DATA:  Syncope EXAM: PORTABLE CHEST 1 VIEW COMPARISON:  01/01/2019 FINDINGS: Cardiomegaly. No confluent airspace opacities, effusions or edema. Aortic atherosclerosis. No acute bony abnormality. IMPRESSION: Cardiomegaly.  Aortic atherosclerosis. No active disease. Electronically Signed   By: Rolm Baptise M.D.   On: 09/02/2020 11:57   EEG adult  Result Date: 09/06/2020 Raenette Rover, MD     09/06/2020  9:45 PM EEG Report Indication: possible seizure activity This study was recorded in the waking/drowsy state.  The duration of the study was 23 minutes.  Electrodes were placed according to the International 10/20 system.  Video was reviewed for clinical correlation as needed.  In the waking state, some degree of background organization is seen with an attenuated but perceptible anterior - posterior voltage and frequency gradient.  In the occipital leads there is a symmetric and reactive posterior dominant rhythm of approximately 7 hertz, which is slower than expected for age.  Anteriorly, is the expected pattern of faster frequency, lower voltage waveforms though in a significantly reduced amount as compared to normal.  During the drowsy state, there is attenuation of the gradient, a general  shift to slower frequencies diffusely to the theta range/diminished alpha, a waxing and waning of the posterior dominant rhythm, and slow roving eye movements.  No clear sleep transients were seen.  The most notable findings however were 1) slowing involving reduced faster frequencies (in the left temporal) and additionally intermixed low-moderate amplitude theta in the right temporal region, and 2) intermittent sharp waves in the right temporal region and an isolated likely epileptiform discharge (14:17:35).  Wickets are possible, but seem less likely given the associated slowing, lack of a classic up/downslope morphology or occurrence in a theta-frequency cluster. Hyperventilation: deferred Photic stimulation: deferred  Impression:  This is an abnormal waking and drowsy study due to 1) attenuated  organization as described above suggestive of a mild diffuse encephalopathy, with 2) superimposed focal neuronal dysfunction in the right greater than left bitemporal region, and 3) occasional, isolated right temporal discharges suggestive of an epileptiform foci in this region.  There was no evidence of seizure activity however.      Subjective: Patient sleeping this morning.  No family was at bedside.  I went back but late morning, son Fritz Pickerel was at bedside.  No new complaints.  He expressed ongoing difficulties with oral intake.  He stated that the family has made the final decision of transitioning to hospice.  He is invited family members/grandkids to visit patient.  Discharge Exam:  Vitals:   09/14/20 2019 09/15/20 1233 09/16/20 0517 09/16/20 1405  BP: 102/60 (!) 144/66 (!) 103/51 127/62  Pulse: 90 82 77 86  Resp: _0 Temp: 98.7 F (37.1 C) 99 F (37.2 C) 98 F (36.7 C) 98.8 F (37.1 C)  TempSrc: Axillary Oral Oral Axillary  SpO2: 96% 97% 96% 98%  Weight:      Height:        General exam: Very elderly female, moderately built and frail lying comfortably propped up in bed without  distress.  Was sleeping this morning. Respiratory system: Clear to auscultation. Respiratory effort normal. Cardiovascular system: S1 & S2 heard, RRR. No JVD, murmurs, rubs, gallops or clicks. No pedal edema.  Not on telemetry. Gastrointestinal system: Abdomen is nondistended, soft and nontender. No organomegaly or masses felt. Normal bowel sounds heard. Central nervous system:  Was sleeping this morning, did not arouse. No focal neurological deficits. Extremities: Appears to be moving both upper extremities symmetrically and well. Skin: No rashes, lesions or ulcers Psychiatry: Judgement and insight impaired. Mood & affect cannot be assessed.  Intermittently smiles    The results of significant diagnostics from this hospitalization (including imaging, microbiology, ancillary and laboratory) are listed below for reference.     Microbiology: Recent Results (from the past 240 hour(s))  Culture, blood (routine x 2)     Status: None   Collection Time: 09/09/20 10:08 AM   Specimen: BLOOD  Result Value Ref Range Status   Specimen Description   Final    BLOOD BLOOD LEFT FOREARM Performed at [facility] [address], [place], [place] [address]    Special Requests   Final    BOTTLES DRAWN AEROBIC AND ANAEROBIC Blood Culture adequate volume Performed at [facility] [address], [place], [place] [address]    Culture   Final    NO GROWTH 5 DAYS Performed at [facility], [place] [address], [place], [place] [address]    Report Status 09/14/2020 FINAL  Final  Culture, blood (routine x 2)     Status: None   Collection Time: 09/09/20 10:10 AM   Specimen: BLOOD  Result Value Ref Range Status   Specimen Description   Final    BLOOD LEFT WRIST Performed at [facility] [address] [place], [place] [address]    Special Requests   Final    BOTTLES DRAWN AEROBIC AND ANAEROBIC Blood Culture adequate volume Performed at  [facility] [address], [place], [place] [address]    Culture   Final    NO GROWTH 5 DAYS Performed at [facility], [place] [address], [place],  [address]    Report Status 09/14/2020 FINAL  Final     Labs: CBC: Recent Labs  Lab 09/10/20 0750 09/11/20 0558 09/12/20 0453 09/13/20 0452 09/15/20 0526  WBC 10.3 9.8 7.8 7.3 7.9  NEUTROABS 7.6 6.6 5.2 4.1 4.7  HGB 12.6 12.0 10.8* 10.1* 11.0*  HCT 40.4 37.8 34.7* 32.9* 35.2*  MCV 96.9 95.7 96.4 97.6 97.5  PLT 138* 140* 174 165 850    Basic Metabolic Panel: Recent Labs  Lab 09/10/20 0750 09/11/20 0558 09/12/20 0453 09/13/20 0452 09/15/20 0526  NA 139 139 139 141 141  K 3.2* 4.3 4.0 4.1 4.0  CL 110 105 108 110 105  CO2 18* _0 GLUCOSE 186* 192* 164* 148* 133*  BUN _1 CREATININE 0.63 0.81 0.79 0.78 0.91  CALCIUM 8.9 8.9 8.5* 8.6* 9.0  MG 1.9 1.8 2.3 1.9 2.0  PHOS 2.0* 2.7 2.9 2.8 3.8    Liver Function Tests: Recent Labs  Lab 09/10/20 0750 09/11/20 0558 09/12/20 0453 09/13/20 0452 09/15/20 0526  AST 13* 16 12* 13* 13*  ALT _2 ALKPHOS 61 57 55 52 52  BILITOT 0.6 0.8 0.6 0.4 0.5  PROT 6.7 6.2* 5.6* 5.4* 5.9*  ALBUMIN 3.0* 2.8* 2.6* 2.5* 2.8*   Urinalysis    Component Value Date/Time   COLORURINE AMBER (A) 09/04/2020 1851   APPEARANCEUR CLOUDY (A) 09/04/2020 1851   LABSPEC 1.018 09/04/2020 1851   PHURINE 5.0 09/04/2020 1851   GLUCOSEU NEGATIVE 09/04/2020 1851   HGBUR NEGATIVE 09/04/2020 1851   BILIRUBINUR NEGATIVE 09/04/2020 1851   KETONESUR 5 (A) 09/04/2020 1851   PROTEINUR 100 (A) 09/04/2020 1851   NITRITE NEGATIVE 09/04/2020 1851   LEUKOCYTESUR LARGE (A) 09/04/2020 1851    Discussed in detail with son Fritz Pickerel at bedside, updated care and answered all questions.  Time coordinating discharge: 35 minutes  SIGNED:  Vernell Leep, MD, [place], [facility]. [facility]  To contact the attending provider between 7A-7P or the covering  provider during after hours 7P-7A, please log into the web site [email or web address] and access using universal [place] password for that web site. If you do not have the password, please call the hospital operator.

## 2020-09-16 NOTE — Progress Notes (Signed)
Report called to Joy at [facility].  Awaiting arrival of [facility] - patient resting and in NAD.  IV removed - WNL.  AVS placed in packet to be taken with transport.

## 2020-09-16 NOTE — Progress Notes (Signed)
  Speech Language Pathology Treatment: Dysphagia  Patient Details Name: Lori Ellis MRN: [id number] DOB: 1920/07/18 Today's Date: 09/16/2020 Time: 1215-1232 SLP Time Calculation (min) (ACUTE ONLY): 17 min  Assessment / Plan / Recommendation Clinical Impression  SLP provided pt's son with information re: concept of comfort feeding including assuring pt is alert, accepting of intake and not having difficulty.  Advised to potential diet and administration modifications as pt's illness progress. He reports pt is sleeping "a lot" and suspected medication contributing.  Pt currently appears very comfortable and is currently sleeping, he concurred.  Reviewed other ways to provide pt oral moisture, etc for comfort but prevent her from overtly coughing/choking. Reviewed recommendation for pt's son to use icecream, pudding, etc to help pt orally transit solids she masticates to decrease discomfort/potential aspiration of liquids.  All education completed.  Recommend to continue diet as tolerated.    HPI HPI: Patient is a 87 y.o. female with PMH: essential HTN, DM-2, peripheral neuropathy, spinal stenosis, chronic pain, hypothryroidism, vitamin D deficiency who presented to ER from SNF on 4/00 with metabolic encephalopathy. MRI revealed No acute or reversible finding. Atrophy and small-vessel ischemic changes throughout the brain. CXR revealed probable left lung atelectasis, no focal consolidation, pleural effusion or pnemothorax.Pt has not progressed well during her 11 day hospital stay and she is now comfort care.  SLP follow up indicatd to educate family re: concept of comfort feeding.      SLP Plan  All goals met       Recommendations  Diet recommendations: Other(comment) (comfort po as tolerated) Medication Administration: Whole meds with puree Compensations: Small sips/bites;Slow rate Postural Changes and/or Swallow Maneuvers: Seated upright 90 degrees;Upright 30-60 min after meal                 Oral Care Recommendations: Staff/trained caregiver to provide oral care Follow up Recommendations: Skilled Nursing facility SLP Visit Diagnosis: Dysphagia, unspecified (R13.10);Dysphagia, oral phase (R13.11) Plan: All goals met       GO              Lori Lime, MS [facility] SLP Acute Rehab Services Office [phone] Pager [phone]    Lori Ellis 09/16/2020, 1:07 PM

## 2020-09-16 NOTE — Care Management Important Message (Signed)
Important Message  Patient Details IM Letter given to the Patient. Name: KATHREEN DILEO MRN: [id number] Date of Birth: 01-Oct-1920   Medicare Important Message Given:  Yes     Caren Macadam 09/16/2020, 11:49 AM

## 2020-09-16 NOTE — Progress Notes (Signed)
   Met with pt son at bedside. Discussed hospice services with him and the Hospice home service agreement. Discussed goals and he has agreed comfort care is the pt's wishes. He is in agreement with moving pt to hospice Home in [facility]. We do have a bed to offer today. Pt. Son will sign paperwork with me.I have updated Juliann Pulse who will begin process for discharge with MD if he is in agreement.  Webb Silversmith RN [phone]

## 2020-09-16 NOTE — Progress Notes (Signed)
Patient ID: Lori Ellis, female   DOB: 04/04/1921, 87 y.o.   MRN: [id number]  Medical records reviewed   87 y.o.femalewith past medical history oftype 2 diabetes, peripheral neuropathy, chronic pain, hypothyroidism, hypertension, and chronic kidney disease stage III. Patient presented to the ED from [facility] assisted living facility with concerns for altered mental status. Patient was recently hospitalized for syncopal episodes and also treated for UTI (discharged on antibiotics). CT of head showed no acute abnormalities. Chest x-ray unremarkable. 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  Patientcontinued to fail to thrive during this hospital stay in spite of full medical support.  Poor po intake and mentation waxes and wanes.   Patient's 2 sons Lori Ellis and Lori Ellis) in conversation with PMT  have made decision for focus of care to be comfort and dignity, allowing for a natural death.         Patient has documented declaration of a natural death.     Today Ms. [name] is lethargic, she continues with poor p.o. intake, decreasing urine output  increased lethargy and overall failure to thrive.   This nurse practitioner met again today at the bedside with her son/[name] for continued conversation regarding current medical situation and transition of care.  Lori Ellis verbalized an understanding of the patient's current situation, her overall failure to thrive and her likely limited prognosis.    Again detailed the difference between an aggressive medical intervention path and a full comfort path, specifically to minimizing necessary medications.  Education offered on the natural trajectory and expectations of end-of-life.  Focus is comfort and dignity, allowing for a natural death.   Plan of Care: -DNR/DNI -no artifical feeding or hydration now or in the future        - diet as toelrated -minimize medication to only those that enhance comfort   -Roxanol-for underlying pain and dyspnea     -Haldol-for agitation -no further diagnostics - family is hopeful for [facility] for EOL care- will place referral  - prognosis is likely less than a few weeks   Continued  education explaining hospice benefit and the difference between hospice services in the home, in a skilled nursing facility and in a residential facility.  Questions and concerns addressed   Discussed with Dr Exie Parody  Total time spent on the unit was 35 minutes  Greater than 50% of the time was spent in counseling and coordination of care  Wadie Lessen NP  Palliative Medicine Team Team Phone # [phone] Pager [phone]

## 2020-09-16 NOTE — Progress Notes (Incomplete)
Patient ID: Lori Ellis, female   DOB: [birth year], 87 y.o.   MRN: [id number]  Medical records reviewed   87 y.o.femalewith past medical history oftype 2 diabetes, peripheral neuropathy, chronic pain, hypothyroidism, hypertension, and chronic kidney disease stage III. Patient presented to the ED from [facility] assisted living facility with concerns for altered mental status. Patient was recently hospitalized for syncopal episodes and also treated for UTI (discharged on antibiotics). CT of head showed no acute abnormalities. Chest x-ray unremarkable. 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  Patient has continued to fail to thrive during this hospital stay in spite of full medical support.  Poor po intake and mentation waxes and wanes.   Patient's son/ Lori Ellis at bedside.   Family is in agreement with decision focusing on comfort at EOL,  allowing for a natural death.          Today [name] is lethargic, she continues with poor p.o. intake, decreasing urine output  increased lethargy and overall failure to thrive.   This nurse practitioner met again today at the bedside with her 2 sons for continued conversation regarding current medical situation and transition of care.  Both sons verbalized an understanding of the patient's current situation, her overall failure to thrive and her likely limited prognosis.   Education offered on the natural trajectory and expectations of end-of-life.  Focus is comfort and dignity, allowing for a natural death.   Plan of Care: -DNR/DNI -no artifical feeding or hydration now or in the future        - diet as toelrated -minimize medication to only those that enhance comfort ( I discontinued, vitamins, Lovenox, Lipitor)   -Roxanol-for underlying pain and dyspnea    -Haldol-for agitation -no further diagnostics - family is hopeful for [facility] for EOL care- awiat feedback - prognosis is likely less than a few weeks   Continued   education explaining hospice benefit and the difference between hospice services in the home, in a skilled nursing facility and in a residential facility.  Questions and concerns addressed     Total time spent on the unit was 60 minutes  Greater than 50% of the time was spent in counseling and coordination of care  Wadie Lessen NP  Palliative Medicine Team Team Phone # [phone] Pager [phone]

## 2020-09-16 NOTE — TOC Progression Note (Addendum)
Transition of Care [facility]) - Progression Note    Patient Details  Name: Lori Ellis MRN: [id number] Date of Birth: Mar 28, 1921  Transition of Care [facility]) CM/SW Contact  Rahshawn Remo, Olegario Messier, RN Phone Number: 09/16/2020, 10:16 AM  Clinical Narrative:Spoke to Lori Ellis son about referral for residential hospice-in agreement- Referral for Residential Hospice facility-[facility] in [place]-rep-Cheri will eval-await outcome. Lori Ellis has 1st choice-[facility]. 2nd-[place] [facility].3rd-SNF w/PCS.  3p-[facility] in [place]-will provide transportation by PTAR,Signed DNR,PTAR forms to be placed in packet-Nsg aware.    Expected Discharge Plan: Hospice Medical Facility Barriers to Discharge: Continued Medical Work up  Expected Discharge Plan and Services Expected Discharge Plan: Hospice Medical Facility       Living arrangements for the past 2 months: Assisted Living Facility                                       Social Determinants of Health (SDOH) Interventions    Readmission Risk Interventions No flowsheet data found.

## 2020-09-16 NOTE — TOC Transition Note (Addendum)
Transition of Care [facility]) - CM/SW Discharge Note   Patient Details  Name: Lori Ellis MRN: [id number] Date of Birth: [birth year]  Transition of Care [facility]) CM/SW Contact:  Lanier Clam, RN Phone Number: 09/16/2020, 3:01 PM   Clinical Narrative: accepted to [facility] in [place]. PTAR for transport-DNR,PTAR forms to be placed in packet. Nsg call report once d/c summary in [phone].Will call PTAR once ready.   3p-PTAR called.   Final next level of care: Hospice Medical Facility Barriers to Discharge: No Barriers Identified   Patient Goals and CMS Choice Patient states their goals for this hospitalization and ongoing recovery are:: per son, would like for patient to go to rehab- [facility] if possible CMS Medicare.gov Compare Post Acute Care list provided to:: Patient Represenative (must comment) Choice offered to / list presented to : Adult Children  Discharge Placement              Patient chooses bed at: Other - please specify in the comment section below: ([facility] [place]-[facility]idential hospice) Patient to be transferred to facility by: PTAR Name of family member notified: [name] son [phone] Patient and family notified of of transfer: 09/16/20  Discharge Plan and Services                                     Social Determinants of Health (SDOH) Interventions     Readmission Risk Interventions No flowsheet data found.

## 2020-09-17 LAB — SARS CORONAVIRUS 2 (TAT 6-24 HRS): SARS Coronavirus 2: NEGATIVE

## 2020-09-22 LAB — VITAMIN B1: Vitamin B1 (Thiamine): 124.7 nmol/L (ref 66.5–200.0)

## 2020-10-17 DEATH — deceased

## 2021-05-17 IMAGING — DX DG LUMBAR SPINE COMPLETE 4+V
5 series · 5 of 5 positions shown · non-contrast
Comparison: 02/09/2016

CLINICAL DATA: Acute on chronic back pain

EXAM:
LUMBAR SPINE - COMPLETE 4+ VIEW

[l-spine ap]
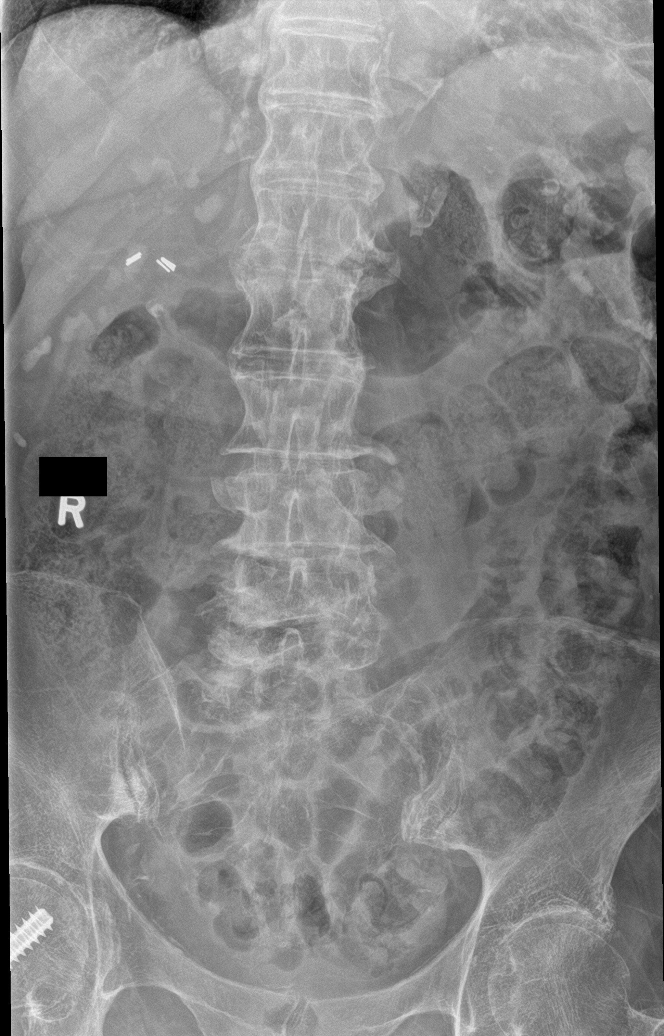

[l-spine obl (1 of 2)]
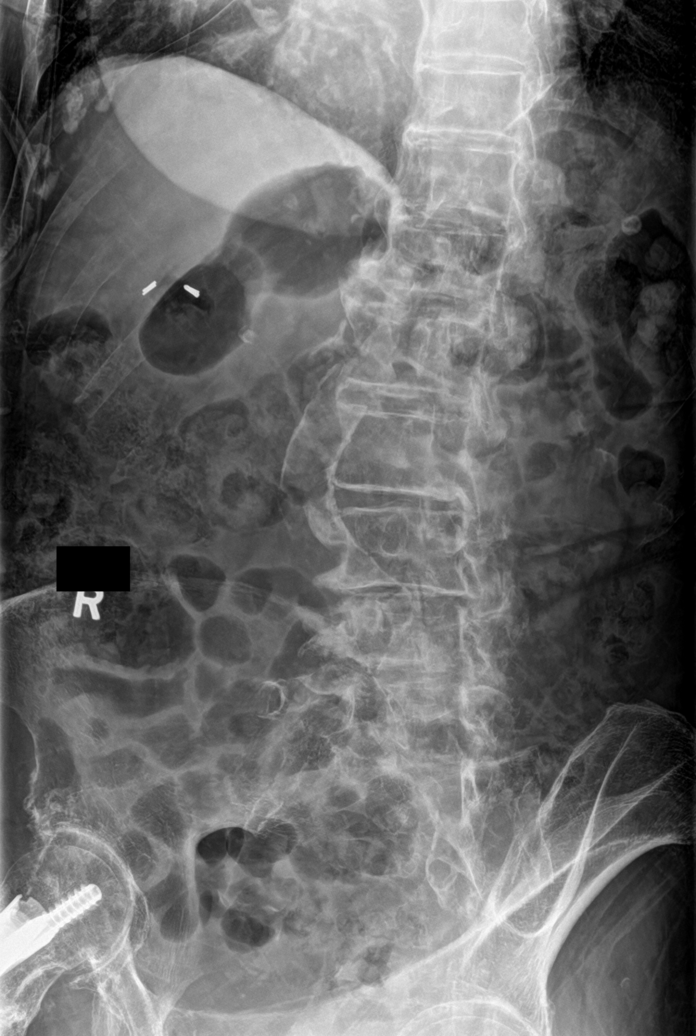

[l-spine obl (2 of 2)]
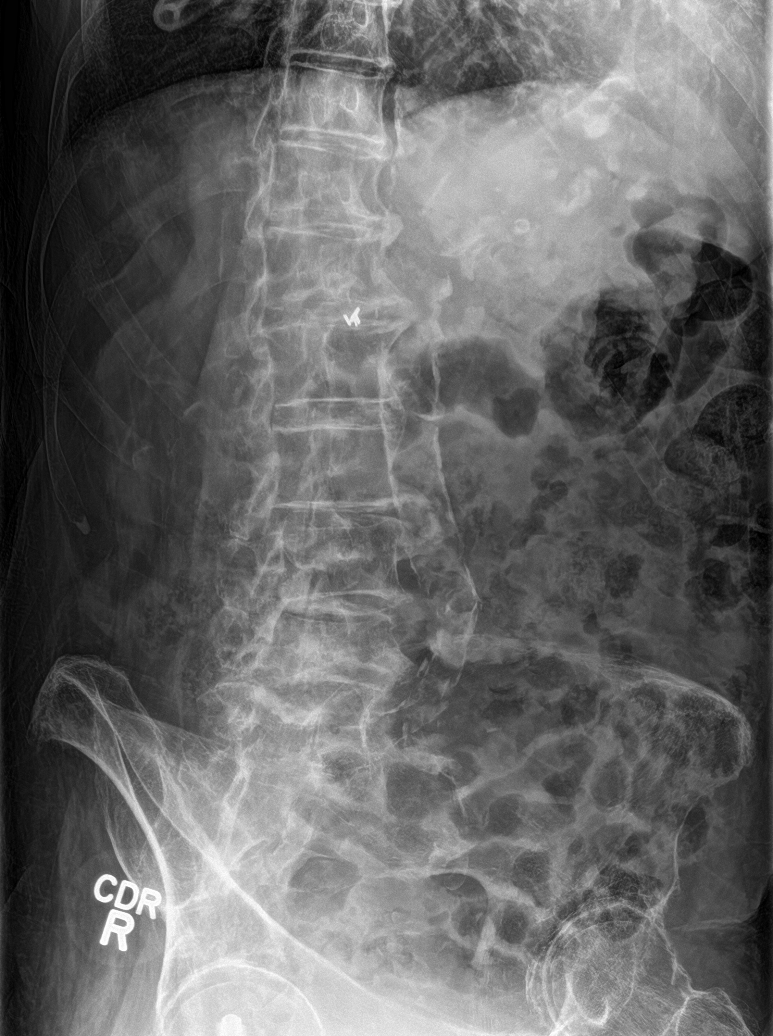

[l-spine lat]
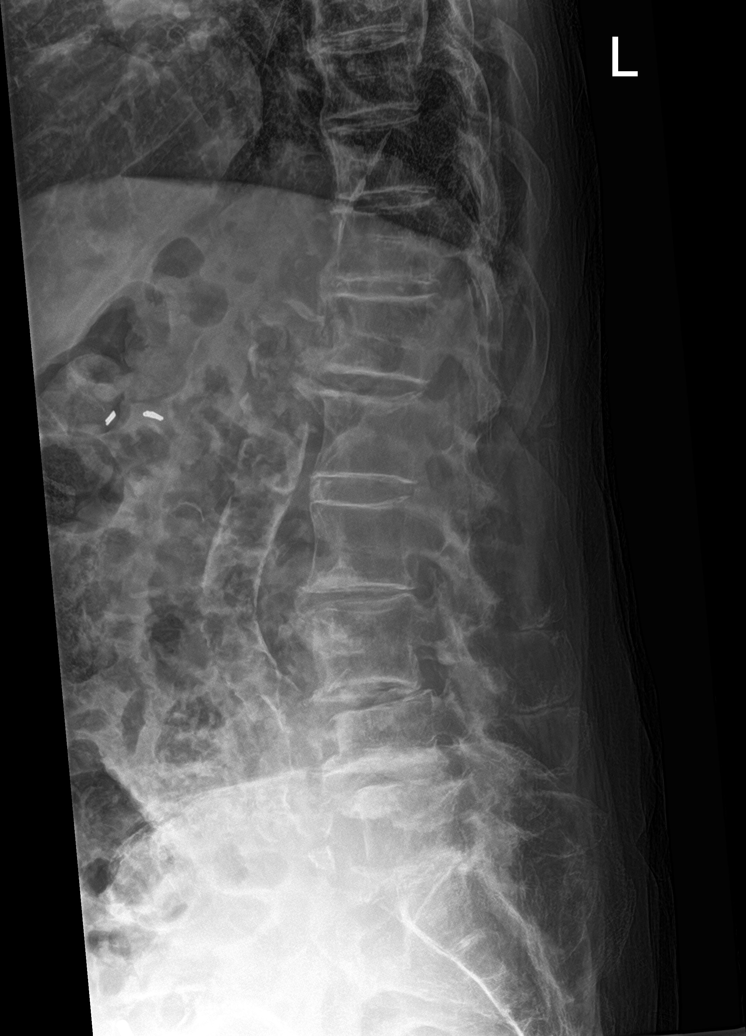

[l-spine spot]
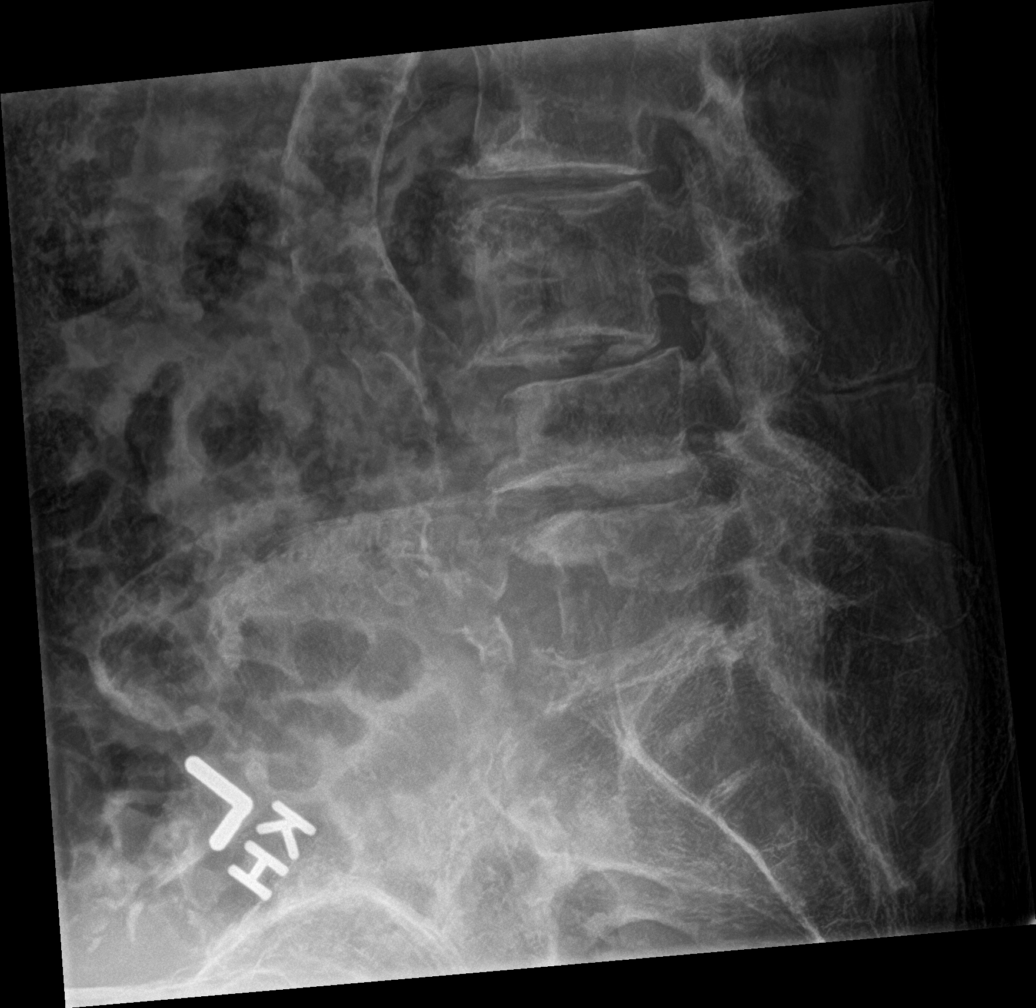

[5 of 5 positions shown; findings below may reference images not displayed]

FINDINGS: Frontal, bilateral oblique, lateral views of the lumbar spine are
obtained. There are 5 non-rib-bearing lumbar type vertebral bodies.
Mild grade 1 anterolisthesis of L3 on L4 and L4 on L5. There is
progressive spondylosis at L4-5 and L5-S1. There is progressive
diffuse facet hypertrophy. No acute fractures. Sacroiliac joints are
normal. Postsurgical changes right hip. Extensive atherosclerosis.
IMPRESSION: 1. Progressive lumbar spondylosis and facet hypertrophy. No acute
fracture.

## 2021-12-12 IMAGING — DX DG CHEST 1V PORT
1 series · 1 of 1 positions shown · non-contrast
Comparison: 01/01/2019

CLINICAL DATA: Syncope

EXAM:
PORTABLE CHEST 1 VIEW

[chest ap]
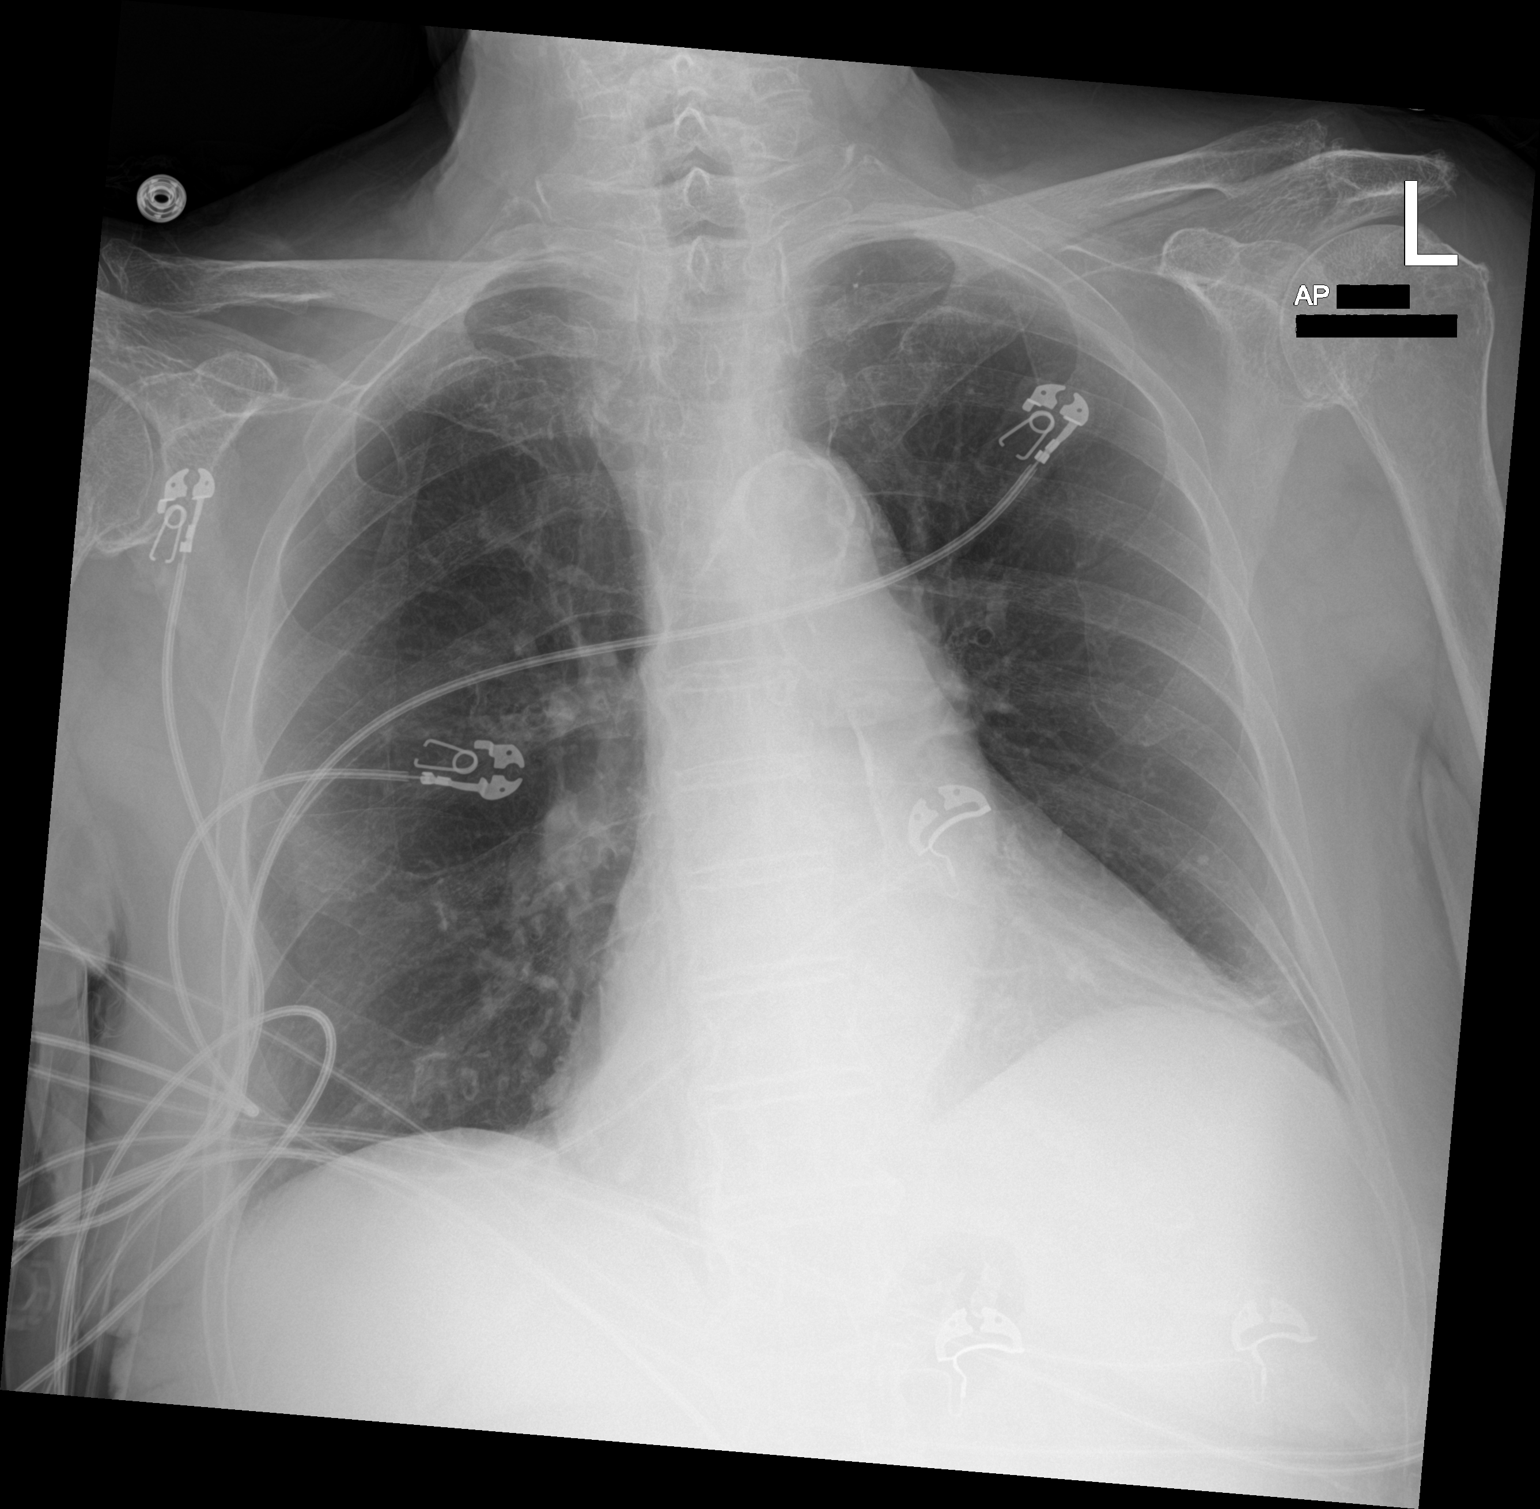

[1 of 1 positions shown; findings below may reference images not displayed]

FINDINGS: Cardiomegaly. No confluent airspace opacities, effusions or edema.
Aortic atherosclerosis. No acute bony abnormality.
IMPRESSION: Cardiomegaly.  Aortic atherosclerosis.

No active disease.
